# Patient Record
Sex: Male | Born: 1965 | Race: White | Hispanic: No | Smoking: Former smoker
Health system: Southern US, Community
[De-identification: ages and names within clinical notes are randomized; demographics above are authoritative.]

## PROBLEM LIST (undated history)

## (undated) DIAGNOSIS — E119 Type 2 diabetes mellitus without complications: Secondary | ICD-10-CM

## (undated) DIAGNOSIS — E7800 Pure hypercholesterolemia, unspecified: Secondary | ICD-10-CM

## (undated) DIAGNOSIS — E109 Type 1 diabetes mellitus without complications: Secondary | ICD-10-CM

## (undated) DIAGNOSIS — K219 Gastro-esophageal reflux disease without esophagitis: Secondary | ICD-10-CM

## (undated) DIAGNOSIS — F419 Anxiety disorder, unspecified: Secondary | ICD-10-CM

## (undated) HISTORY — PX: EYE SURGERY: SHX253

## (undated) HISTORY — DX: Type 1 diabetes mellitus without complications: E10.9

## (undated) HISTORY — PX: FEMORAL ARTERY STENT: SHX1583

## (undated) HISTORY — PX: TOE SURGERY: SHX1073

## (undated) HISTORY — PX: OTHER SURGICAL HISTORY: SHX169

---

## 2008-09-08 ENCOUNTER — Emergency Department: Payer: Self-pay | Admitting: Emergency Medicine

## 2008-09-10 ENCOUNTER — Emergency Department: Payer: Self-pay | Admitting: Emergency Medicine

## 2009-07-23 ENCOUNTER — Emergency Department: Payer: Self-pay | Admitting: Unknown Physician Specialty

## 2009-09-22 ENCOUNTER — Emergency Department: Payer: Self-pay | Admitting: Emergency Medicine

## 2009-10-24 ENCOUNTER — Emergency Department: Payer: Self-pay | Admitting: Emergency Medicine

## 2011-01-11 ENCOUNTER — Emergency Department: Payer: Self-pay | Admitting: Emergency Medicine

## 2011-02-23 ENCOUNTER — Emergency Department: Payer: Self-pay | Admitting: Emergency Medicine

## 2011-10-16 ENCOUNTER — Emergency Department: Payer: Self-pay | Admitting: Emergency Medicine

## 2011-10-16 LAB — CBC
MCHC: 32.4 g/dL (ref 32.0–36.0)
MCV: 94 fL (ref 80–100)
Platelet: 118 10*3/uL — ABNORMAL LOW (ref 150–440)
RBC: 4.73 10*6/uL (ref 4.40–5.90)
RDW: 13.7 % (ref 11.5–14.5)
WBC: 4.9 10*3/uL (ref 3.8–10.6)

## 2011-10-16 LAB — COMPREHENSIVE METABOLIC PANEL
Albumin: 3.7 g/dL (ref 3.4–5.0)
Alkaline Phosphatase: 109 U/L (ref 50–136)
Bilirubin,Total: 0.4 mg/dL (ref 0.2–1.0)
Chloride: 104 mmol/L (ref 98–107)
EGFR (African American): >60
Glucose: 201 mg/dL — ABNORMAL HIGH (ref 65–99)
SGOT(AST): 28 U/L (ref 15–37)
SGPT (ALT): 26 U/L (ref 12–78)
Sodium: 138 mmol/L (ref 136–145)
Total Protein: 7.2 g/dL (ref 6.4–8.2)

## 2011-12-12 DIAGNOSIS — L03119 Cellulitis of unspecified part of limb: Secondary | ICD-10-CM | POA: Insufficient documentation

## 2011-12-16 DIAGNOSIS — M86172 Other acute osteomyelitis, left ankle and foot: Secondary | ICD-10-CM | POA: Insufficient documentation

## 2011-12-16 DIAGNOSIS — M86179 Other acute osteomyelitis, unspecified ankle and foot: Secondary | ICD-10-CM | POA: Insufficient documentation

## 2011-12-19 ENCOUNTER — Emergency Department: Payer: Self-pay | Admitting: Emergency Medicine

## 2012-01-11 ENCOUNTER — Emergency Department: Payer: Self-pay | Admitting: Emergency Medicine

## 2012-01-11 LAB — URINALYSIS, COMPLETE
Glucose,UR: >=500 mg/dL (ref 0–75)
Leukocyte Esterase: NEGATIVE
Nitrite: NEGATIVE
Ph: 6 (ref 4.5–8.0)
Protein: 100
RBC,UR: 1305 /HPF (ref 0–5)
Specific Gravity: 1.022 (ref 1.003–1.030)

## 2012-01-11 LAB — COMPREHENSIVE METABOLIC PANEL
Albumin: 3.6 g/dL (ref 3.4–5.0)
Anion Gap: 6 — ABNORMAL LOW (ref 7–16)
Bilirubin,Total: 0.4 mg/dL (ref 0.2–1.0)
Chloride: 109 mmol/L — ABNORMAL HIGH (ref 98–107)
Creatinine: 0.87 mg/dL (ref 0.60–1.30)
EGFR (African American): >60
EGFR (Non-African Amer.): >60
Glucose: 84 mg/dL (ref 65–99)
Osmolality: 283 (ref 275–301)
Potassium: 3.7 mmol/L (ref 3.5–5.1)
SGOT(AST): 88 U/L — ABNORMAL HIGH (ref 15–37)
Sodium: 142 mmol/L (ref 136–145)
Total Protein: 6.4 g/dL (ref 6.4–8.2)

## 2012-01-11 LAB — CBC
HCT: 38 % — ABNORMAL LOW (ref 40.0–52.0)
HGB: 13 g/dL (ref 13.0–18.0)
MCHC: 34.3 g/dL (ref 32.0–36.0)
MCV: 93 fL (ref 80–100)
RDW: 15.2 % — ABNORMAL HIGH (ref 11.5–14.5)
WBC: 2.1 10*3/uL — ABNORMAL LOW (ref 3.8–10.6)

## 2012-01-11 LAB — LIPASE, BLOOD: Lipase: 123 U/L (ref 73–393)

## 2012-01-11 LAB — PROTIME-INR: Prothrombin Time: 13.5 secs (ref 11.5–14.7)

## 2012-01-13 LAB — URINE CULTURE

## 2012-05-29 DIAGNOSIS — E109 Type 1 diabetes mellitus without complications: Secondary | ICD-10-CM | POA: Insufficient documentation

## 2012-06-04 DIAGNOSIS — E113599 Type 2 diabetes mellitus with proliferative diabetic retinopathy without macular edema, unspecified eye: Secondary | ICD-10-CM | POA: Insufficient documentation

## 2012-06-19 DIAGNOSIS — E113593 Type 2 diabetes mellitus with proliferative diabetic retinopathy without macular edema, bilateral: Secondary | ICD-10-CM | POA: Insufficient documentation

## 2013-09-29 DIAGNOSIS — IMO0002 Reserved for concepts with insufficient information to code with codable children: Secondary | ICD-10-CM | POA: Insufficient documentation

## 2013-09-29 DIAGNOSIS — E1039 Type 1 diabetes mellitus with other diabetic ophthalmic complication: Secondary | ICD-10-CM | POA: Insufficient documentation

## 2013-09-29 DIAGNOSIS — E11319 Type 2 diabetes mellitus with unspecified diabetic retinopathy without macular edema: Secondary | ICD-10-CM | POA: Insufficient documentation

## 2013-09-29 DIAGNOSIS — E1065 Type 1 diabetes mellitus with hyperglycemia: Secondary | ICD-10-CM

## 2014-02-03 ENCOUNTER — Emergency Department: Payer: Self-pay | Admitting: Emergency Medicine

## 2014-02-03 LAB — CBC WITH DIFFERENTIAL/PLATELET
BASOS ABS: 0 10*3/uL (ref 0.0–0.1)
Basophil %: 0.6 %
EOS PCT: 1.2 %
Eosinophil #: 0.1 10*3/uL (ref 0.0–0.7)
HCT: 39.8 % — ABNORMAL LOW (ref 40.0–52.0)
HGB: 12.8 g/dL — ABNORMAL LOW (ref 13.0–18.0)
LYMPHS ABS: 1.3 10*3/uL (ref 1.0–3.6)
LYMPHS PCT: 16.7 %
MCH: 29.5 pg (ref 26.0–34.0)
MCHC: 32 g/dL (ref 32.0–36.0)
MCV: 92 fL (ref 80–100)
Monocyte #: 0.5 x10 3/mm (ref 0.2–1.0)
Monocyte %: 6.8 %
NEUTROS ABS: 5.8 10*3/uL (ref 1.4–6.5)
Neutrophil %: 74.7 %
Platelet: 291 10*3/uL (ref 150–440)
RBC: 4.34 10*6/uL — ABNORMAL LOW (ref 4.40–5.90)
RDW: 13.3 % (ref 11.5–14.5)
WBC: 7.8 10*3/uL (ref 3.8–10.6)

## 2014-02-03 LAB — URINALYSIS, COMPLETE
BILIRUBIN, UR: NEGATIVE
Glucose,UR: >=500 mg/dL (ref 0–75)
Nitrite: NEGATIVE
Ph: 5 (ref 4.5–8.0)
Protein: 30
SQUAMOUS EPITHELIAL: NONE SEEN
Specific Gravity: 1.024 (ref 1.003–1.030)

## 2014-02-03 LAB — BASIC METABOLIC PANEL
Anion Gap: 9 (ref 7–16)
BUN: 18 mg/dL (ref 7–18)
Calcium, Total: 9 mg/dL (ref 8.5–10.1)
Chloride: 99 mmol/L (ref 98–107)
Co2: 28 mmol/L (ref 21–32)
Creatinine: 1.18 mg/dL (ref 0.60–1.30)
EGFR (African American): >60
EGFR (Non-African Amer.): >60
GLUCOSE: 309 mg/dL — AB (ref 65–99)
OSMOLALITY: 286 (ref 275–301)
Potassium: 4.3 mmol/L (ref 3.5–5.1)
SODIUM: 136 mmol/L (ref 136–145)

## 2014-03-02 ENCOUNTER — Emergency Department: Payer: Self-pay | Admitting: Emergency Medicine

## 2014-03-02 LAB — COMPREHENSIVE METABOLIC PANEL
ALBUMIN: 3 g/dL — AB (ref 3.4–5.0)
ALK PHOS: 100 U/L
ANION GAP: 3 — AB (ref 7–16)
BUN: 28 mg/dL — ABNORMAL HIGH (ref 7–18)
Bilirubin,Total: 0.4 mg/dL (ref 0.2–1.0)
CHLORIDE: 102 mmol/L (ref 98–107)
CREATININE: 1.3 mg/dL (ref 0.60–1.30)
Calcium, Total: 9 mg/dL (ref 8.5–10.1)
Co2: 33 mmol/L — ABNORMAL HIGH (ref 21–32)
EGFR (African American): >60
EGFR (Non-African Amer.): >60
Glucose: 349 mg/dL — ABNORMAL HIGH (ref 65–99)
Osmolality: 295 (ref 275–301)
Potassium: 4.7 mmol/L (ref 3.5–5.1)
SGOT(AST): 14 U/L — ABNORMAL LOW (ref 15–37)
SGPT (ALT): 12 U/L — ABNORMAL LOW
SODIUM: 138 mmol/L (ref 136–145)
TOTAL PROTEIN: 7.8 g/dL (ref 6.4–8.2)

## 2014-03-02 LAB — CBC WITH DIFFERENTIAL/PLATELET
BASOS ABS: 0 10*3/uL (ref 0.0–0.1)
Basophil %: 0.4 %
Eosinophil #: 0.1 10*3/uL (ref 0.0–0.7)
Eosinophil %: 1 %
HCT: 37.2 % — ABNORMAL LOW (ref 40.0–52.0)
HGB: 11.9 g/dL — ABNORMAL LOW (ref 13.0–18.0)
LYMPHS ABS: 1 10*3/uL (ref 1.0–3.6)
Lymphocyte %: 10.6 %
MCH: 29.2 pg (ref 26.0–34.0)
MCHC: 32.1 g/dL (ref 32.0–36.0)
MCV: 91 fL (ref 80–100)
MONO ABS: 0.7 x10 3/mm (ref 0.2–1.0)
MONOS PCT: 6.9 %
Neutrophil #: 8 10*3/uL — ABNORMAL HIGH (ref 1.4–6.5)
Neutrophil %: 81.1 %
Platelet: 270 10*3/uL (ref 150–440)
RBC: 4.09 10*6/uL — ABNORMAL LOW (ref 4.40–5.90)
RDW: 14 % (ref 11.5–14.5)
WBC: 9.9 10*3/uL (ref 3.8–10.6)

## 2014-03-02 LAB — URINALYSIS, COMPLETE
BACTERIA: NONE SEEN
Bilirubin,UR: NEGATIVE
Glucose,UR: >=500 mg/dL (ref 0–75)
KETONE: NEGATIVE
Leukocyte Esterase: NEGATIVE
Nitrite: NEGATIVE
PH: 5 (ref 4.5–8.0)
SPECIFIC GRAVITY: 1.017 (ref 1.003–1.030)
Squamous Epithelial: NONE SEEN

## 2014-11-30 ENCOUNTER — Emergency Department
Admission: EM | Admit: 2014-11-30 | Discharge: 2014-11-30 | Disposition: A | Discharge status: Home / Self Care | Payer: Self-pay | Attending: Emergency Medicine | Admitting: Emergency Medicine

## 2014-11-30 DIAGNOSIS — N3000 Acute cystitis without hematuria: Secondary | ICD-10-CM | POA: Insufficient documentation

## 2014-11-30 DIAGNOSIS — B029 Zoster without complications: Secondary | ICD-10-CM | POA: Insufficient documentation

## 2014-11-30 DIAGNOSIS — N342 Other urethritis: Secondary | ICD-10-CM | POA: Insufficient documentation

## 2014-11-30 DIAGNOSIS — E119 Type 2 diabetes mellitus without complications: Secondary | ICD-10-CM | POA: Insufficient documentation

## 2014-11-30 DIAGNOSIS — N341 Nonspecific urethritis: Secondary | ICD-10-CM

## 2014-11-30 HISTORY — DX: Type 2 diabetes mellitus without complications: E11.9

## 2014-11-30 LAB — CHLAMYDIA/NGC RT PCR (ARMC ONLY)
CHLAMYDIA TR: NOT DETECTED
N GONORRHOEAE: NOT DETECTED

## 2014-11-30 LAB — URINALYSIS COMPLETE WITH MICROSCOPIC (ARMC ONLY)
Bilirubin Urine: NEGATIVE
Glucose, UA: >500 mg/dL — AB
Ketones, ur: NEGATIVE mg/dL
Nitrite: NEGATIVE
PROTEIN: NEGATIVE mg/dL
SQUAMOUS EPITHELIAL / LPF: NONE SEEN
Specific Gravity, Urine: 1.021 (ref 1.005–1.030)
pH: 5 (ref 5.0–8.0)

## 2014-11-30 LAB — WET PREP, GENITAL
Clue Cells Wet Prep HPF POC: NONE SEEN
TRICH WET PREP: NONE SEEN
YEAST WET PREP: NONE SEEN

## 2014-11-30 MED ORDER — LIDOCAINE 4 % EX PTCH: 1.0000 | MEDICATED_PATCH | Freq: Every day | CUTANEOUS | Status: DC

## 2014-11-30 MED ORDER — AZITHROMYCIN 250 MG PO TABS
1000.0000 mg | ORAL_TABLET | Freq: Once | ORAL | Status: AC
  Administered 2014-11-30: 1000 mg via ORAL
  Filled 2014-11-30: qty 4

## 2014-11-30 MED ORDER — NAPROXEN 500 MG PO TABS: 500.0000 mg | ORAL_TABLET | Freq: Two times a day (BID) | ORAL | Status: AC

## 2014-11-30 MED ORDER — LIDOCAINE HCL (PF) 1 % IJ SOLN
2.1000 mL | Freq: Once | INTRAMUSCULAR | Status: AC
  Administered 2014-11-30: 2.1 mL
  Filled 2014-11-30: qty 5

## 2014-11-30 MED ORDER — CEFTRIAXONE SODIUM 1 G IJ SOLR
1.0000 g | Freq: Once | INTRAMUSCULAR | Status: AC
  Administered 2014-11-30: 1 g via INTRAMUSCULAR
  Filled 2014-11-30: qty 10

## 2014-11-30 NOTE — ED Notes (Signed)
States he has noticed dark and foul smelling urine for a few days  occasional burn denies any discharge

## 2014-11-30 NOTE — ED Provider Notes (Signed)
[facility] Emergency Department Provider Note  ____________________________________________  Time seen: Approximately 2:17 PM  I have reviewed the triage vital signs and the nursing notes.   HISTORY  Chief Complaint Rash and Urinary Frequency   HPI Leonard Johnston is a 49 y.o. male who presents to the emergency department for evaluation of rash to the right side of his abdomen that has been present for approximately a week. He is also concerned about foul-smelling urine that has been present for the past 3 days. He reports that he has had frequent urinary tract infections. He denies known or concern for STD exposure.   Past Medical History  Diagnosis Date  . Diabetes mellitus without complication ([place])     There are no active problems to display for this patient.   Past Surgical History  Procedure Laterality Date  . Fracture surgery      Current Outpatient Rx  Name  Route  Sig  Dispense  Refill  . Lidocaine 4 % PTCH   Apply externally   Apply 1 patch topically daily. Do not apply more than 3 patches at a time   30 patch   0   . naproxen (NAPROSYN) 500 MG tablet   Oral   Take 1 tablet (500 mg total) by mouth 2 (two) times daily with a meal.   60 tablet   2     Allergies Review of patient's allergies indicates no known allergies.  No family history on file.  Social History Social History  Substance Use Topics  . Smoking status: Never Smoker   . Smokeless tobacco: None  . Alcohol Use: No    Review of Systems Constitutional: No fever/chills Eyes: No visual changes. ENT: No sore throat. Cardiovascular: Denies chest pain. Respiratory: Denies shortness of breath. Gastrointestinal: No abdominal pain.  No nausea, no vomiting.  No diarrhea.  No constipation. Genitourinary: Occasional dysuria, positive for foul-smelling urine Musculoskeletal: Negative for back pain. Skin: Positive for rash. Neurological: Negative for headaches, focal  weakness or numbness.  10-point ROS otherwise negative.  ____________________________________________   PHYSICAL EXAM:  VITAL SIGNS: ED Triage Vitals  Enc Vitals Group     BP 11/30/14 0814 122/82 mmHg     Pulse Rate 11/30/14 0814 81     Resp 11/30/14 0814 16     Temp 11/30/14 0814 97.5 F (36.4 C)     Temp Source 11/30/14 0814 Oral     SpO2 11/30/14 0814 100 %     Weight 11/30/14 0814 181 lb (82.101 kg)     Height 11/30/14 0814 6\' 9"  (2.057 m)     Head Cir --      Peak Flow --      Pain Score 11/30/14 0814 4     Pain Loc --      Pain Edu? --      Excl. in [place]? --     Constitutional: Alert and oriented. Well appearing and in no acute distress. Eyes: Conjunctivae are normal. PERRL. EOMI. Head: Atraumatic. Nose: No congestion/rhinnorhea. Mouth/Throat: Mucous membranes are moist.   Neck: No stridor.   Cardiovascular: Normal rate, regular rhythm.  Good peripheral circulation. Respiratory: Normal respiratory effort.  No retractions. Gastrointestinal: Soft and nontender. No distention. No abdominal bruits. No CVA tenderness. Musculoskeletal: No lower extremity tenderness nor edema.  No joint effusions. Neurologic:  Normal speech and language. No gross focal neurologic deficits are appreciated. No gait instability. Skin:  Skin is warm, dry and intact. Vesicular rash noted along  the dermatome on the right lower abdomen. Rash does not cross the midline. Psychiatric: Mood and affect are normal. Speech and behavior are normal.  ____________________________________________   LABS (all labs ordered are listed, but only abnormal results are displayed)  Labs Reviewed  WET PREP, GENITAL - Abnormal; Notable for the following:    WBC, Wet Prep HPF POC MODERATE (*)    All other components within normal limits  URINALYSIS COMPLETEWITH MICROSCOPIC ([facility] ONLY) - Abnormal; Notable for the following:    Color, Urine YELLOW (*)    APPearance CLOUDY (*)    Glucose, UA >500 (*)    Hgb  urine dipstick 1+ (*)    Leukocytes, UA 3+ (*)    Bacteria, UA FEW (*)    All other components within normal limits  CHLAMYDIA/NGC RT PCR ([facility] ONLY)   WBC: 6-30 ____________________________________________  EKG   ____________________________________________  RADIOLOGY   ____________________________________________   PROCEDURES  Procedure(s) performed: None  Critical Care performed: No  ____________________________________________   INITIAL IMPRESSION / ASSESSMENT AND PLAN / ED COURSE  Pertinent labs & imaging results that were available during my care of the patient were reviewed by me and considered in my medical decision making (see chart for details).  Patient was advised to follow up with the primary care provider of his choice in 10 days to recheck the urine. He was advised to return to the ER for symptoms that change or worsen if unable to schedule an appointment. ____________________________________________   FINAL CLINICAL IMPRESSION(S) / ED DIAGNOSES  Final diagnoses:  Urethritis, nonspecific  Acute cystitis without hematuria  Herpes zoster      Victorino Dike, FNP 11/30/14 1425  Lavonia Drafts, MD 11/30/14 1524

## 2014-11-30 NOTE — ED Notes (Signed)
Pt c/o painful rash to the right mid abd for the past week.the patient also c/o foul smelling odor to urine

## 2015-05-18 ENCOUNTER — Ambulatory Visit
Admission: EM | Admit: 2015-05-18 | Discharge: 2015-05-18 | Disposition: A | Discharge status: Home / Self Care | Payer: BLUE CROSS/BLUE SHIELD | Attending: Family Medicine | Admitting: Family Medicine

## 2015-05-18 ENCOUNTER — Encounter: Payer: Self-pay | Admitting: *Deleted

## 2015-05-18 ENCOUNTER — Ambulatory Visit (INDEPENDENT_AMBULATORY_CARE_PROVIDER_SITE_OTHER): Payer: BLUE CROSS/BLUE SHIELD

## 2015-05-18 DIAGNOSIS — M7022 Olecranon bursitis, left elbow: Secondary | ICD-10-CM | POA: Diagnosis not present

## 2015-05-18 DIAGNOSIS — S5002XA Contusion of left elbow, initial encounter: Secondary | ICD-10-CM

## 2015-05-18 HISTORY — DX: Pure hypercholesterolemia, unspecified: E78.00

## 2015-05-18 HISTORY — DX: Anxiety disorder, unspecified: F41.9

## 2015-05-18 MED ORDER — CEPHALEXIN 500 MG PO CAPS: 500.0000 mg | ORAL_CAPSULE | Freq: Four times a day (QID) | ORAL | Status: DC

## 2015-05-18 NOTE — ED Notes (Signed)
Patient fell on left elbow this AM while working on his floor at home.

## 2015-05-18 NOTE — ED Provider Notes (Signed)
CSN: [id number]     Arrival date & time 05/18/15  1850 History   First MD Initiated Contact with Patient 05/18/15 1915     Chief Complaint  Patient presents with  . Joint Swelling   (Consider location/radiation/quality/duration/timing/severity/associated sxs/prior Treatment) HPI: Patient presents today after falling on his left elbow. He denies any other injuries. He denies any previous injury to the left elbow in the past. He has been applying ice to the area and the area has gotten smaller in size. He has full range of motion of the elbow. He denies any fever or chills. He is a type I diabetic. He states he has never had any history of MRSA in the past.  Past Medical History  Diagnosis Date  . Diabetes mellitus without complication ([place])   . Hypertension   . Anxiety   . Hypercholesteremia    Past Surgical History  Procedure Laterality Date  . Fracture surgery     History reviewed. No pertinent family history. Social History  Substance Use Topics  . Smoking status: Never Smoker   . Smokeless tobacco: None  . Alcohol Use: No    Review of Systems: Negative except mentioned above.   Allergies  Review of patient's allergies indicates no known allergies.  Home Medications   Prior to Admission medications   Medication Sig Start Date End Date Taking? Authorizing Provider  busPIRone (BUSPAR) 10 MG tablet Take 10 mg by mouth 3 (three) times daily.   Yes Historical Provider, MD  gabapentin (NEURONTIN) 400 MG capsule Take 400 mg by mouth 3 (three) times daily.   Yes Historical Provider, MD  hydrOXYzine (ATARAX/VISTARIL) 25 MG tablet Take 25 mg by mouth 3 (three) times daily.   Yes Historical Provider, MD  insulin aspart (NOVOLOG FLEXPEN) 100 UNIT/ML FlexPen Inject into the skin 3 (three) times daily with meals.   Yes Historical Provider, MD  Insulin Glargine (LANTUS SOLOSTAR) 100 UNIT/ML Solostar Pen Inject 30 Units into the skin daily at 10 pm.   Yes Historical Provider, MD   lisinopril (PRINIVIL,ZESTRIL) 5 MG tablet Take 5 mg by mouth daily.   Yes Historical Provider, MD  omeprazole (PRILOSEC) 20 MG capsule Take 20 mg by mouth daily.   Yes Historical Provider, MD  simvastatin (ZOCOR) 20 MG tablet Take 20 mg by mouth daily.   Yes Historical Provider, MD  traZODone (DESYREL) 50 MG tablet Take 50 mg by mouth at bedtime.   Yes Historical Provider, MD  cephALEXin (KEFLEX) 500 MG capsule Take 1 capsule (500 mg total) by mouth 4 (four) times daily. 05/18/15   Paulina Fusi, MD  Lidocaine 4 % PTCH Apply 1 patch topically daily. Do not apply more than 3 patches at a time 11/30/14   Victorino Dike, FNP  naproxen (NAPROSYN) 500 MG tablet Take 1 tablet (500 mg total) by mouth 2 (two) times daily with a meal. 11/30/14 11/30/15  Victorino Dike, FNP   Meds Ordered and Administered this Visit  Medications - No data to display  BP 102/68 mmHg  Pulse 82  Temp(Src) 97.8 F (36.6 C) (Oral)  Resp 18  Ht 6\' 9"  (2.057 m)  Wt 190 lb (86.183 kg)  BMI 20.37 kg/m2  SpO2 98% No data found.   Physical Exam   GENERAL: NAD MSK: L Elbow-mild swelling and tenderness at olecranon, FROM, nv intact, *there is a 41mm abrasion/break in the skin with no bleeding at the olecranon site   ED Course  Procedures (including critical care time)  Labs Review Labs Reviewed - No data to display  Imaging Review Dg Elbow Complete Left  05/18/2015  CLINICAL DATA:  Pain following fall EXAM: LEFT ELBOW - COMPLETE 3+ VIEW COMPARISON:  September 22, 2009 FINDINGS: Frontal, lateral, and bilateral oblique views were obtained. There is no fracture or dislocation. No joint effusion. The joint spaces appear normal. No erosive change IMPRESSION: No fracture or dislocation.  No appreciable arthropathy. Electronically Signed   By: Lowella Grip III M.D.   On: 05/18/2015 19:31     MDM   1. Elbow contusion, left, initial encounter   2. Bursitis of elbow, left   Encouraged RICE. Will take anti-inflammatory when  necessary. Patient has a very tiny abrasion in the olecranon area. I have given him Hibiclens to clean the area daily. He is to monitor for any infection. We will put him on Keflex prophylactically. I have recommended that he follow up with his primary care physician this week for follow-up. Discussed the importance of keeping the area clean and watching it carefully for any signs of infection. Patient addresses understanding    Paulina Fusi, MD 05/18/15 [phone]

## 2015-06-10 ENCOUNTER — Emergency Department
Admission: EM | Admit: 2015-06-10 | Discharge: 2015-06-10 | Disposition: A | Discharge status: Home / Self Care | Payer: BLUE CROSS/BLUE SHIELD | Attending: Student | Admitting: Student

## 2015-06-10 ENCOUNTER — Encounter: Payer: Self-pay | Admitting: Emergency Medicine

## 2015-06-10 DIAGNOSIS — E162 Hypoglycemia, unspecified: Secondary | ICD-10-CM

## 2015-06-10 DIAGNOSIS — I10 Essential (primary) hypertension: Secondary | ICD-10-CM | POA: Insufficient documentation

## 2015-06-10 DIAGNOSIS — Z79899 Other long term (current) drug therapy: Secondary | ICD-10-CM | POA: Insufficient documentation

## 2015-06-10 DIAGNOSIS — Z794 Long term (current) use of insulin: Secondary | ICD-10-CM | POA: Diagnosis not present

## 2015-06-10 DIAGNOSIS — E7800 Pure hypercholesterolemia, unspecified: Secondary | ICD-10-CM | POA: Insufficient documentation

## 2015-06-10 DIAGNOSIS — E11649 Type 2 diabetes mellitus with hypoglycemia without coma: Secondary | ICD-10-CM | POA: Diagnosis present

## 2015-06-10 LAB — CBC WITH DIFFERENTIAL/PLATELET
BASOS ABS: 0 10*3/uL (ref 0–0.1)
Basophils Relative: 0 %
EOS PCT: 0 %
Eosinophils Absolute: 0 10*3/uL (ref 0–0.7)
HEMATOCRIT: 41.6 % (ref 40.0–52.0)
Hemoglobin: 13.9 g/dL (ref 13.0–18.0)
LYMPHS ABS: 0.5 10*3/uL — AB (ref 1.0–3.6)
LYMPHS PCT: 7 %
MCH: 30.8 pg (ref 26.0–34.0)
MCHC: 33.5 g/dL (ref 32.0–36.0)
MCV: 91.9 fL (ref 80.0–100.0)
MONO ABS: 0.4 10*3/uL (ref 0.2–1.0)
Monocytes Relative: 6 %
NEUTROS ABS: 6 10*3/uL (ref 1.4–6.5)
Neutrophils Relative %: 87 %
PLATELETS: 120 10*3/uL — AB (ref 150–440)
RBC: 4.52 MIL/uL (ref 4.40–5.90)
RDW: 13.8 % (ref 11.5–14.5)
WBC: 6.9 10*3/uL (ref 3.8–10.6)

## 2015-06-10 LAB — BASIC METABOLIC PANEL
ANION GAP: 6 (ref 5–15)
BUN: 31 mg/dL — AB (ref 6–20)
CO2: 24 mmol/L (ref 22–32)
Calcium: 9.2 mg/dL (ref 8.9–10.3)
Chloride: 107 mmol/L (ref 101–111)
Creatinine, Ser: 1.24 mg/dL (ref 0.61–1.24)
GFR calc Af Amer: >60 mL/min (ref >60)
GFR calc non Af Amer: >60 mL/min (ref >60)
GLUCOSE: 114 mg/dL — AB (ref 65–99)
POTASSIUM: 4 mmol/L (ref 3.5–5.1)
Sodium: 137 mmol/L (ref 135–145)

## 2015-06-10 LAB — GLUCOSE, CAPILLARY
Glucose-Capillary: 192 mg/dL — ABNORMAL HIGH (ref 65–99)
Glucose-Capillary: 230 mg/dL — ABNORMAL HIGH (ref 65–99)

## 2015-06-10 NOTE — ED Notes (Signed)
Patient states he had a hypoglycemic event this morning where he lost "2 hours".  States he remembers being out in the yard watering flowers and then the next thing he knew he was in his truck across town soaking wet.  Reports taking his CBG and it being 38 around noon.    Patient is AAOx3.  Skin warm and dry.  Ambulated independently into triage.  Patient is currently in NAD.

## 2015-06-10 NOTE — ED Provider Notes (Addendum)
[facility] Emergency Department Provider Note  ____________________________________________  Time seen: Approximately 5:19 PM  I have reviewed the triage vital signs and the nursing notes.   HISTORY  Chief Complaint Hypoglycemia    HPI Leonard Johnston is a 50 y.o. male with hypertension, diabetes on insulin dependent, hyperlipidemia, anxiety who presents for evaluation of hypoglycemia today, gradual onset, now resolved after eating a cheeseburger and drinking Pepsi, initially severe. Patient reports that he awoke this morning and took his sliding scale insulin as well as long acting insulin however he did not eat breakfast because he wasn't particular hungry. He reports that he went outside to water his plants "and the next thing I know I was across town in my car... I had driven there and I didn't know how I got there and I was covered in sweat". He estimates that he "lost 2 hours" where he wasn't aware of what he was doing but he was apparently driving. He reports he was able to call his wife after he checked his sugar which was 52 and she sent her daughter who brought him candy, cheeseburgers and Pepsi's after which his sugar improved and he felt much better. He reports that this has happened to him possibly 12 times over the course of his lifetime, always in the setting of hypoglycemia. He reports that at one point "I lost 9 hours of time" in a similar incident. He denies any chest pain, difficulty breathing, headache, numbness or weakness. No nausea, vomiting, diarrhea, fevers or chills.   Past Medical History  Diagnosis Date  . Diabetes mellitus without complication ([place])   . Hypertension   . Anxiety   . Hypercholesteremia     There are no active problems to display for this patient.   Past Surgical History  Procedure Laterality Date  . Fracture surgery      Current Outpatient Rx  Name  Route  Sig  Dispense  Refill  . busPIRone (BUSPAR) 10 MG tablet    Oral   Take 10 mg by mouth 3 (three) times daily.         . cephALEXin (KEFLEX) 500 MG capsule   Oral   Take 1 capsule (500 mg total) by mouth 4 (four) times daily.   20 capsule   0   . gabapentin (NEURONTIN) 400 MG capsule   Oral   Take 400 mg by mouth 3 (three) times daily.         . hydrOXYzine (ATARAX/VISTARIL) 25 MG tablet   Oral   Take 25 mg by mouth 3 (three) times daily.         . insulin aspart (NOVOLOG FLEXPEN) 100 UNIT/ML FlexPen   Subcutaneous   Inject into the skin 3 (three) times daily with meals.         . Insulin Glargine (LANTUS SOLOSTAR) 100 UNIT/ML Solostar Pen   Subcutaneous   Inject 30 Units into the skin daily at 10 pm.         . Lidocaine 4 % PTCH   Apply externally   Apply 1 patch topically daily. Do not apply more than 3 patches at a time   30 patch   0   . lisinopril (PRINIVIL,ZESTRIL) 5 MG tablet   Oral   Take 5 mg by mouth daily.         . naproxen (NAPROSYN) 500 MG tablet   Oral   Take 1 tablet (500 mg total) by mouth 2 (two) times daily with  a meal.   60 tablet   2   . omeprazole (PRILOSEC) 20 MG capsule   Oral   Take 20 mg by mouth daily.         . simvastatin (ZOCOR) 20 MG tablet   Oral   Take 20 mg by mouth daily.         . traZODone (DESYREL) 50 MG tablet   Oral   Take 50 mg by mouth at bedtime.           Allergies Review of patient's allergies indicates no known allergies.  No family history on file.  Social History Social History  Substance Use Topics  . Smoking status: Never Smoker   . Smokeless tobacco: None  . Alcohol Use: No    Review of Systems Constitutional: No fever/chills Eyes: No visual changes. ENT: No sore throat. Cardiovascular: Denies chest pain. Respiratory: Denies shortness of breath. Gastrointestinal: No abdominal pain.  No nausea, no vomiting.  No diarrhea.  No constipation. Genitourinary: Negative for dysuria. Musculoskeletal: Negative for back pain. Skin: Negative for  rash. Neurological: Negative for headaches, focal weakness or numbness.  10-point ROS otherwise negative.  ____________________________________________   PHYSICAL EXAM:  VITAL SIGNS: ED Triage Vitals  Enc Vitals Group     BP 06/10/15 1438 152/83 mmHg     Pulse Rate 06/10/15 1438 83     Resp 06/10/15 1438 16     Temp 06/10/15 1438 97.4 F (36.3 C)     Temp Source 06/10/15 1438 Oral     SpO2 06/10/15 1438 97 %     Weight 06/10/15 1438 191 lb (86.637 kg)     Height 06/10/15 1438 6\' 9"  (2.057 m)     Head Cir --      Peak Flow --      Pain Score 06/10/15 1440 0     Pain Loc --      Pain Edu? --      Excl. in [place]? --     Constitutional: Alert and oriented. Well appearing and in no acute distress. Eyes: Conjunctivae are normal. PERRL. EOMI. Head: Atraumatic. Nose: No congestion/rhinnorhea. Mouth/Throat: Mucous membranes are moist.  Oropharynx non-erythematous. Neck: No stridor.  No cervical spine tenderness to palpation. Cardiovascular: Normal rate, regular rhythm. Grossly normal heart sounds.  Good peripheral circulation. Respiratory: Normal respiratory effort.  No retractions. Lungs CTAB. Gastrointestinal: Soft and nontender. No distention.  No CVA tenderness. Genitourinary: deferred Musculoskeletal: No lower extremity tenderness nor edema.  No joint effusions. Neurologic:  Normal speech and language. No gross focal neurologic deficits are appreciated. No gait instability. 5 out of 5 strength bilateral upper and lower extremity, sensation intact to light touch throughout, cranial nerves II through XII intact. Skin:  Skin is warm, dry and intact. No rash noted. Psychiatric: Mood and affect are normal. Speech and behavior are normal.  ____________________________________________   LABS (all labs ordered are listed, but only abnormal results are displayed)  Labs Reviewed  CBC WITH DIFFERENTIAL/PLATELET - Abnormal; Notable for the following:    Platelets 120 (*)    Lymphs  Abs 0.5 (*)    All other components within normal limits  BASIC METABOLIC PANEL - Abnormal; Notable for the following:    Glucose, Bld 114 (*)    BUN 31 (*)    All other components within normal limits  GLUCOSE, CAPILLARY - Abnormal; Notable for the following:    Glucose-Capillary 192 (*)    All other components within normal limits  GLUCOSE, CAPILLARY -  Abnormal; Notable for the following:    Glucose-Capillary 230 (*)    All other components within normal limits   ____________________________________________  EKG  ED ECG REPORT I, Joanne Gavel, the attending physician, personally viewed and interpreted this ECG.   Date: 06/10/2015  EKG Time: 17:57  Rate: 79  Rhythm: normal sinus rhythm  Axis: normal  Intervals:none  ST&T Change: No acute ST elevation. No acute ST depression. Motion artifact in the lateral and inferior leads.  ____________________________________________  RADIOLOGY  none ____________________________________________   PROCEDURES  Procedure(s) performed: None  Critical Care performed: No  ____________________________________________   INITIAL IMPRESSION / ASSESSMENT AND PLAN / ED COURSE  Pertinent labs & imaging results that were available during my care of the patient were reviewed by me and considered in my medical decision making (see chart for details).  Leonard Johnston is a 50 y.o. male with hypertension, diabetes on insulin dependent, hyperlipidemia, anxiety who presents for evaluation of hypoglycemia today as well as a bizarre episode where he "lost time". This has happened to him many times before. Currently he has no complaints. He is awake, alert, in no acute distress. He has an intact neurological examination. Vital signs stable, he is afebrile. CBC notable for mild thrombocytopenia with platelet count 120. BMP unremarkable, initial glucose was 114. We'll observe in the emergency department, check serial glucoses, reassess for  disposition.  ----------------------------------------- 7:18 PM on 06/10/2015 ----------------------------------------- Patient has had several stable glucose measurements here in the emergency department. He continues to feel well. I've advised him to eat breakfast if he is going to give himself his insulin in the morning as prescribed. We also discussed return precautions, need for close PCP follow-up and he is comfortable with the discharge plan. DC home. ____________________________________________   FINAL CLINICAL IMPRESSION(S) / ED DIAGNOSES  Final diagnoses:  Hypoglycemia      Joanne Gavel, MD 06/10/15 1919  Joanne Gavel, MD 06/10/15 1919

## 2015-07-24 ENCOUNTER — Ambulatory Visit
Admission: EM | Admit: 2015-07-24 | Discharge: 2015-07-24 | Disposition: A | Discharge status: Home / Self Care | Payer: BLUE CROSS/BLUE SHIELD | Attending: Family Medicine | Admitting: Family Medicine

## 2015-07-24 ENCOUNTER — Encounter: Payer: Self-pay | Admitting: Emergency Medicine

## 2015-07-24 DIAGNOSIS — S338XXA Sprain of other parts of lumbar spine and pelvis, initial encounter: Secondary | ICD-10-CM

## 2015-07-24 DIAGNOSIS — S39012A Strain of muscle, fascia and tendon of lower back, initial encounter: Secondary | ICD-10-CM

## 2015-07-24 MED ORDER — OXYCODONE-ACETAMINOPHEN 5-325 MG PO TABS
1.0000 | ORAL_TABLET | Freq: Three times a day (TID) | ORAL | Status: DC | PRN
Start: 2015-07-24 — End: 2016-08-03

## 2015-07-24 MED ORDER — MELOXICAM 15 MG PO TABS: 15.0000 mg | ORAL_TABLET | Freq: Every day | ORAL | Status: DC | PRN

## 2015-07-24 NOTE — ED Provider Notes (Signed)
[facility]  ____________________________________________  Time seen: Approximately 1650 PM  I have reviewed the triage vital signs and the nursing notes.   HISTORY  Chief Complaint  Back Pain  HPI Leonard Johnston is a 50 y.o. male present with wife at bedside for the complaints of left lower back pain 3 days. Patient reports that pain onset was when he was carrying a heavy roll of carpet upstairs. Patient states that he was carrying the roll of carpet on his right shoulder and then as he stepped awkwardly with his left leg he states that he twisted his back and felt a pain in his left lower back. Patient reports he has had continued pain is left lower back since. Patient reports that once he felt the pain he passed off the carpet to his friend. Patient states that he held to the railing and did not fall. Denies any direct trauma. Denies fall, head injury or loss of consciousness. Reports pain has been present since, and unresolved with over the counter ibuprofen. Reports mild pain when sitting still, but pain primarily with movement.   Denies pain radiation, numbness, tingling sensation, other injury, midline pain, dysuria, urinary or bowel retention or incontinence. Denies fevers Denies recent sickness. Denies extremity pains. Denies chest pain, shortness of breath, abdominal pain, weakness, or dizziness.   PCP: Brunetta Genera   Past Medical History  Diagnosis Date  . Diabetes mellitus without complication ([place])   . Hypertension   . Anxiety   . Hypercholesteremia     There are no active problems to display for this patient.   Past Surgical History  Procedure Laterality Date  . Fracture surgery      Current Outpatient Rx  Name  Route  Sig  Dispense  Refill  . busPIRone (BUSPAR) 10 MG tablet   Oral   Take 10 mg by mouth 3 (three) times daily.         .           . gabapentin (NEURONTIN) 400 MG capsule   Oral   Take 400 mg by mouth 3 (three) times daily.         .  hydrOXYzine (ATARAX/VISTARIL) 25 MG tablet   Oral   Take 25 mg by mouth 3 (three) times daily.         . insulin aspart (NOVOLOG FLEXPEN) 100 UNIT/ML FlexPen   Subcutaneous   Inject into the skin 3 (three) times daily with meals.         . Insulin Glargine (LANTUS SOLOSTAR) 100 UNIT/ML Solostar Pen   Subcutaneous   Inject 30 Units into the skin daily at 10 pm.         .           . lisinopril (PRINIVIL,ZESTRIL) 5 MG tablet   Oral   Take 5 mg by mouth daily.         .           .           . omeprazole (PRILOSEC) 20 MG capsule   Oral   Take 20 mg by mouth daily.         .           . simvastatin (ZOCOR) 20 MG tablet   Oral   Take 20 mg by mouth daily.         . traZODone (DESYREL) 50 MG tablet   Oral   Take 50 mg by mouth at bedtime.  Allergies Review of patient's allergies indicates no known allergies.  History reviewed. No pertinent family history.  Social History Social History  Substance Use Topics  . Smoking status: Never Smoker   . Smokeless tobacco: None  . Alcohol Use: No    Review of Systems Constitutional: No fever/chills Eyes: No visual changes. ENT: No sore throat. Cardiovascular: Denies chest pain. Respiratory: Denies shortness of breath. Gastrointestinal: No abdominal pain.  No nausea, no vomiting.  No diarrhea.  No constipation. Genitourinary: Negative for dysuria. Musculoskeletal: positive for back pain. Skin: Negative for rash. Neurological: Negative for headaches, focal weakness or numbness.  10-point ROS otherwise negative.  ____________________________________________   PHYSICAL EXAM:  VITAL SIGNS: ED Triage Vitals  Enc Vitals Group     BP 07/24/15 1543 91/62 mmHg     Pulse Rate 07/24/15 1543 86     Resp 07/24/15 1543 16     Temp 07/24/15 1543 97.3 F (36.3 C)     Temp Source 07/24/15 1543 Tympanic     SpO2 07/24/15 1543 100 %     Weight 07/24/15 1543 190 lb (86.183 kg)     Height --      Head Cir  --      Peak Flow --      Pain Score 07/24/15 1545 10     Pain Loc --      Pain Edu? --      Excl. in [place]   07/24/15 1543 07/24/15 1545 07/24/15 1658  BP: 91/62  114/72  Pulse: 86  79  Temp: 97.3 F (36.3 C)    TempSrc: Tympanic    Resp: 16    Weight: 190 lb (86.183 kg)    SpO2: 100%    PainSc:  10-Worst pain ever 10-Worst pain ever    Constitutional: Alert and oriented. Well appearing and in no acute distress. Eyes: Conjunctivae are normal. PERRL. EOMI. Head: Atraumatic.  Mouth/Throat: Mucous membranes are moist.  Oropharynx non-erythematous. Neck: No stridor.  No cervical spine tenderness to palpation. Hematological/Lymphatic/Immunilogical: No cervical lymphadenopathy. Cardiovascular: Normal rate, regular rhythm. Grossly normal heart sounds.  Good peripheral circulation. Respiratory: Normal respiratory effort.  No retractions. Lungs CTAB. Gastrointestinal: Soft and nontender. No distention. Normal Bowel sounds. No CVA tenderness. Musculoskeletal: No lower or upper extremity tenderness nor edema.  Bilateral pedal pulses equal and easily palpated. No midline cervical, thoracic or lumbar tenderness to palpation without ecchymosis, erythema or swelling noted.  Except: Left lower paralumbar and left sciatic notch moderate tenderness to palpation, no skin changes noted, pain increased with lumbar flexion and twisting movements. No saddle anesthesia. 2+ bilateral patellar and achilles reflexes. Changes positions from sitting to standing quickly. Ambulatory in room with steady gait. Strong and equal bilateral plantar flexion and dorsiflexion.  Neurologic:  Normal speech and language. No gross focal neurologic deficits are appreciated. No gait instability.5/5 strength to bilateral upper and lower extremities.  Skin:  Skin is warm, dry and intact. No rash noted. Psychiatric: Mood and affect are normal. Speech and behavior are  normal.  ____________________________________________   LABS (all labs ordered are listed, but only abnormal results are displayed)  Labs Reviewed - No data to display  RADIOLOGY  No results found.   INITIAL IMPRESSION / ASSESSMENT AND PLAN / ED COURSE  Pertinent labs & imaging results that were available during my care of the patient were reviewed by me and considered in my medical decision making (see chart for details).  [place] controlled substance  database utilized, and no control substances documented in the last 6 months.  Very well appearing, no acute distress. Presents for complaints of left lower back pain after carrying heavy carpet. Denies fall or direct trauma. No focal neurological deficits. Suspect lumbosacral strain injury. Discussed evaluating by xray, patient declines need for xray and this time. Discussed with patient follow up and possible imaging if no improvement. Will treat with mobic and prn percocet for breath through pain. Encourage rest, ice stretching. Discussed indication, risks and benefits of medications with patient.Encouraged PCP or orthopedic follow up. Discussed monitoring glucose closely at home with medications.   Discussed follow up with Primary care physician this week. Discussed follow up and return parameters including no resolution or any worsening concerns. Patient verbalized understanding and agreed to plan.   ____________________________________________   FINAL CLINICAL IMPRESSION(S) / ED DIAGNOSES  Final diagnoses:  Lumbosacral strain, initial encounter     Discharge Medication List as of 07/24/2015  4:58 PM    START taking these medications   Details  meloxicam (MOBIC) 15 MG tablet Take 1 tablet (15 mg total) by mouth daily as needed for pain., Starting 07/24/2015, Until Discontinued, Print    oxyCODONE-acetaminophen (ROXICET) 5-325 MG tablet Take 1 tablet by mouth every 8 (eight) hours as needed for moderate pain or severe pain (Do not  drive or operate heavy machinery while taking as can cause drowsiness.)., Starting 07/24/2015, Until Discontinued, Print        Note: This dictation was prepared with Dragon dictation along with smaller phrase technology. Any transcriptional errors that result from this process are unintentional.       Marylene Land, NP 07/24/15 1923  Marylene Land, NP 07/24/15 1924

## 2015-07-24 NOTE — ED Notes (Signed)
Patient states that on Thursday he was carrying a roll of carpet up the stairs.  Patient c/o left sided lower back pain.

## 2015-07-24 NOTE — Discharge Instructions (Signed)
Take medication as prescribed. Rest. Drink plenty of fluids. Apply ice. Avoid strenuous activity. Stretch as tolerated.   Follow up with your primary care physician or orthopedic this week as needed for continued pain.   Return to Urgent care or ER for increased pain, urinary or bowel changes, numbness, new or worsening concerns.

## 2015-09-03 ENCOUNTER — Ambulatory Visit
Admission: EM | Admit: 2015-09-03 | Discharge: 2015-09-03 | Disposition: A | Discharge status: Home / Self Care | Payer: BLUE CROSS/BLUE SHIELD | Attending: Family Medicine | Admitting: Family Medicine

## 2015-09-03 ENCOUNTER — Encounter: Payer: Self-pay | Admitting: *Deleted

## 2015-09-03 DIAGNOSIS — R238 Other skin changes: Secondary | ICD-10-CM

## 2015-09-03 DIAGNOSIS — L089 Local infection of the skin and subcutaneous tissue, unspecified: Secondary | ICD-10-CM | POA: Diagnosis not present

## 2015-09-03 MED ORDER — MUPIROCIN CALCIUM 2 % EX CREA: 1.0000 "application " | TOPICAL_CREAM | Freq: Two times a day (BID) | CUTANEOUS | Status: DC

## 2015-09-03 MED ORDER — SULFAMETHOXAZOLE-TRIMETHOPRIM 800-160 MG PO TABS: 1.0000 | ORAL_TABLET | Freq: Two times a day (BID) | ORAL | Status: AC

## 2015-09-03 NOTE — ED Notes (Signed)
Pt went barefoot at water park 3 days ago and developed 4 ulcers on left foot. 1 on 2nd toe, 1 on 3rd toe, and 2 to sole of foot.

## 2015-09-03 NOTE — ED Provider Notes (Signed)
CSN: [place]:[id number]     Arrival date & time 09/03/15  1212 History   First MD Initiated Contact with Patient 09/03/15 1309     Chief Complaint  Patient presents with  . Foot Ulcer   (Consider location/radiation/quality/duration/timing/severity/associated sxs/prior Treatment) HPI: Patient presents today with open wounds on his left foot. Patient states that he was at a water park a few days ago and developed blisters on the foot by being on the concrete. 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 Patient denies any systemic symptoms such as fever. He has not been taking any medications for his symptoms. He denies any recent fluctuations in his blood sugar. He denies seeing a podiatrist on a regular basis. He admits to Tdap immunization within 5 years.  Past Medical History  Diagnosis Date  . Diabetes mellitus without complication ([place])   . Hypertension   . Anxiety   . Hypercholesteremia    Past Surgical History  Procedure Laterality Date  . Fracture surgery     History reviewed. No pertinent family history. Social History  Substance Use Topics  . Smoking status: Never Smoker   . Smokeless tobacco: None  . Alcohol Use: No    Review of Systems: Negative except mentioned above.   Allergies  Review of patient's allergies indicates no known allergies.  Home Medications   Prior to Admission medications   Medication Sig Start Date End Date Taking? Authorizing Provider  busPIRone (BUSPAR) 10 MG tablet Take 10 mg by mouth 3 (three) times daily.   Yes Historical Provider, MD  gabapentin (NEURONTIN) 400 MG capsule Take 400 mg by mouth 3 (three) times daily.   Yes Historical Provider, MD  hydrOXYzine (ATARAX/VISTARIL) 25 MG tablet Take 25 mg by mouth 3 (three) times daily.   Yes Historical Provider, MD  insulin aspart (NOVOLOG FLEXPEN) 100 UNIT/ML FlexPen Inject into the skin 3  (three) times daily with meals.   Yes Historical Provider, MD  Insulin Glargine (LANTUS SOLOSTAR) 100 UNIT/ML Solostar Pen Inject 30 Units into the skin daily at 10 pm.   Yes Historical Provider, MD  lisinopril (PRINIVIL,ZESTRIL) 5 MG tablet Take 5 mg by mouth daily.   Yes Historical Provider, MD  meloxicam (MOBIC) 15 MG tablet Take 1 tablet (15 mg total) by mouth daily as needed for pain. 07/24/15  Yes Marylene Land, NP  naproxen (NAPROSYN) 500 MG tablet Take 1 tablet (500 mg total) by mouth 2 (two) times daily with a meal. 11/30/14 11/30/15 Yes Cari B Triplett, FNP  omeprazole (PRILOSEC) 20 MG capsule Take 20 mg by mouth daily.   Yes Historical Provider, MD  simvastatin (ZOCOR) 20 MG tablet Take 20 mg by mouth daily.   Yes Historical Provider, MD  traZODone (DESYREL) 50 MG tablet Take 50 mg by mouth at bedtime.   Yes Historical Provider, MD  cephALEXin (KEFLEX) 500 MG capsule Take 1 capsule (500 mg total) by mouth 4 (four) times daily. 05/18/15   Paulina Fusi, MD  Lidocaine 4 % PTCH Apply 1 patch topically daily. Do not apply more than 3 patches at a time 11/30/14   Victorino Dike, FNP  oxyCODONE-acetaminophen (ROXICET) 5-325 MG tablet Take 1 tablet by mouth every 8 (eight) hours as needed for moderate pain or severe pain (Do not drive or operate heavy machinery while taking as can cause drowsiness.). 07/24/15   Marylene Land, NP  sulfamethoxazole-trimethoprim (BACTRIM DS,SEPTRA  DS) 800-160 MG tablet Take 1 tablet by mouth 2 (two) times daily. 09/03/15 09/10/15  Paulina Fusi, MD   Meds Ordered and Administered this Visit  Medications - No data to display  BP 122/87 mmHg  Pulse 88  Temp(Src) 98 F (36.7 C) (Oral)  Resp 16  Ht 6\' 9"  (2.057 m)  Wt 192 lb (87.091 kg)  BMI 20.58 kg/m2  SpO2 99% No data found.   Physical Exam:  GENERAL: NAD RESP: CTA B CARD: RRR SKIN: s/p amputation of left 2nd toe, open non-draining wounds on plantar surface of 2nd and 4th MTP, 3rd and 4th toes, doesn't  appear to be through to the bone, mild erythema around area, no streaks   NEURO: CN II-XII grossly intact   ED Course  Procedures (including critical care time)  Labs Review Labs Reviewed - No data to display  Imaging Review No results found.     MDM   1. Blisters of multiple sites   2. Skin infection   This does not appear to be osteomyelitis at this point however I do recommend that he follow up with podiatry on Monday for further evaluation and treatment. Imaging may need to be done at that time. I have placed the patient on Bactrim DS and Bactroban. I've asked that he monitor his temperature closely as well as his blood sugars. If any worsening symptoms happened this weekend I recommend that he go to the ER. I've asked that he not go barefoot and use crutches if needed. Patient addresses understanding of plan. If patient has problems getting an appointment with podiatry on Monday I've asked that he call her office so we can assist in this happening.    Paulina Fusi, MD 09/03/15 1346

## 2015-09-03 NOTE — ED Notes (Signed)
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 Present ulcers are on 3rd and 4th toes. Left foot is red and edematous.

## 2015-09-11 DIAGNOSIS — N1831 Chronic kidney disease, stage 3a: Secondary | ICD-10-CM | POA: Insufficient documentation

## 2015-09-11 DIAGNOSIS — F419 Anxiety disorder, unspecified: Secondary | ICD-10-CM | POA: Insufficient documentation

## 2015-09-11 DIAGNOSIS — I10 Essential (primary) hypertension: Secondary | ICD-10-CM | POA: Insufficient documentation

## 2015-09-11 DIAGNOSIS — E7800 Pure hypercholesterolemia, unspecified: Secondary | ICD-10-CM | POA: Insufficient documentation

## 2015-09-11 DIAGNOSIS — N179 Acute kidney failure, unspecified: Secondary | ICD-10-CM | POA: Insufficient documentation

## 2016-08-03 ENCOUNTER — Emergency Department
Admission: EM | Admit: 2016-08-03 | Discharge: 2016-08-03 | Disposition: A | Discharge status: Home / Self Care | Payer: Medicare Other | Attending: Emergency Medicine | Admitting: Emergency Medicine

## 2016-08-03 ENCOUNTER — Encounter: Payer: Self-pay | Admitting: Emergency Medicine

## 2016-08-03 DIAGNOSIS — Z79899 Other long term (current) drug therapy: Secondary | ICD-10-CM | POA: Insufficient documentation

## 2016-08-03 DIAGNOSIS — E109 Type 1 diabetes mellitus without complications: Secondary | ICD-10-CM | POA: Diagnosis not present

## 2016-08-03 DIAGNOSIS — Z794 Long term (current) use of insulin: Secondary | ICD-10-CM | POA: Diagnosis not present

## 2016-08-03 DIAGNOSIS — L237 Allergic contact dermatitis due to plants, except food: Secondary | ICD-10-CM | POA: Diagnosis not present

## 2016-08-03 DIAGNOSIS — R21 Rash and other nonspecific skin eruption: Secondary | ICD-10-CM | POA: Diagnosis present

## 2016-08-03 MED ORDER — PREDNISONE 10 MG PO TABS: ORAL_TABLET | ORAL | 0 refills | Status: DC

## 2016-08-03 MED ORDER — DEXAMETHASONE SODIUM PHOSPHATE 10 MG/ML IJ SOLN
10.0000 mg | Freq: Once | INTRAMUSCULAR | Status: AC
  Administered 2016-08-03: 10 mg via INTRAMUSCULAR
  Filled 2016-08-03: qty 1

## 2016-08-03 MED ORDER — TRIAMCINOLONE ACETONIDE 0.5 % EX OINT: 1.0000 "application " | TOPICAL_OINTMENT | Freq: Two times a day (BID) | CUTANEOUS | 0 refills | Status: DC

## 2016-08-03 NOTE — ED Triage Notes (Signed)
Patient presents to the ED with a rash bilaterally to his arms.  Patient states he believes it is poison oak.  Patient states he has been having difficulty sleeping due to pain and itching.  Patient states he has been taking benadryl without much relief.

## 2016-08-03 NOTE — ED Provider Notes (Signed)
[facility] Emergency Department Provider Note ____________________________________________  Time seen: 12:25 PM  I have reviewed the triage vital signs and the nursing notes.  HISTORY  Chief Complaint  Rash   HPI Leonard Johnston is a 51 y.o. male  is here complaining of rash. Patient states that he was doing some yard work and began breaking out in a rash the next day. He went back to the trash pile and was lots of poison oak where he had been working. Patient states that he has been taking Benadryl one every 4 hours without any relief of his itching and rash continues to spread. He now has involvement on his neck, bilateral arms, trunk, lower extremities with new places breaking out today. Patient is on insulin and checks his blood sugars 4 times a day. He states he averages around 135.    Past Medical History:  Diagnosis Date  . Anxiety   . 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 Hypercholesteremia     There are no active problems to display for this patient.   Past Surgical History:  Procedure Laterality Date  . EYE SURGERY    . TOE SURGERY      Prior to Admission medications   Medication Sig Start Date End Date Taking? Authorizing Provider  busPIRone (BUSPAR) 10 MG tablet Take 10 mg by mouth 3 (three) times daily.    [provider]  gabapentin (NEURONTIN) 400 MG capsule Take 400 mg by mouth 3 (three) times daily.    [provider]  insulin aspart (NOVOLOG FLEXPEN) 100 UNIT/ML FlexPen Inject into the skin 3 (three) times daily with meals.    [provider]  Insulin Glargine (LANTUS SOLOSTAR) 100 UNIT/ML Solostar Pen Inject 30 Units into the skin daily at 10 pm.    [provider]  lisinopril (PRINIVIL,ZESTRIL) 5 MG tablet Take 5 mg by mouth daily.    [provider]  omeprazole (PRILOSEC) 20 MG capsule Take 20 mg by mouth daily.    [provider]  predniSONE (DELTASONE) 10 MG  tablet Take 6 tablets  today, on day 2 take 5 tablets, day 3 take 4 tablets, day 4 take 3 tablets, day 5 take  2 tablets and 1 tablet the last day 08/03/16   Johnn Hai, PA-C  simvastatin (ZOCOR) 20 MG tablet Take 20 mg by mouth daily.    [provider]  traZODone (DESYREL) 50 MG tablet Take 50 mg by mouth at bedtime.    [provider]  triamcinolone ointment (KENALOG) 0.5 % Apply 1 application topically 2 (two) times daily. 08/03/16   Johnn Hai, PA-C    Allergies Patient has no known allergies.  No family history on file.  Social History Social History  Substance Use Topics  . Smoking status: Never Smoker  . Smokeless tobacco: Never Used  . Alcohol use No    Review of Systems  Constitutional: Negative for fever. Cardiovascular: Negative for chest pain. Respiratory: Negative for shortness of breath. Musculoskeletal: Negative for back pain. Skin: Positive for acute rash. Neurological: Negative for headaches, focal weakness or numbness. ____________________________________________  PHYSICAL EXAM:  VITAL SIGNS: ED Triage Vitals [08/03/16 1213]  Enc Vitals Group     BP 114/78     Pulse Rate 98     Resp 18     Temp 98.1 F (36.7 C)     Temp Source Oral     SpO2 99 %  Weight 200 lb (90.7 kg)     Height 6\' 9"  (2.057 m)     Head Circumference      Peak Flow      Pain Score 10     Pain Loc      Pain Edu?      Excl. in [place]?     Constitutional: Alert and oriented. Well appearing and in no distress. Head: Normocephalic and atraumatic. Eyes: Conjunctivae are normal. Neck: No stridor. Cardiovascular: Normal rate, regular rhythm. Normal distal pulses. Respiratory: Normal respiratory effort. No wheezes/rales/rhonchi. Gastrointestinal: Soft and nontender. No distention. Musculoskeletal: Nontender with normal range of motion in all extremities.  Neurologic:  Normal gait without ataxia. Normal speech and language. No gross focal neurologic  deficits are appreciated. Skin:  Skin is warm, dry and intact. Multiple areas on the upper and lower extremities, trunk and neck with erythematous bases papules and vesicles. Some are individual and others are in clusters. This is consistent with contact dermatitis. Psychiatric: Mood and affect are normal. Patient exhibits appropriate insight and judgment.  INITIAL IMPRESSION / ASSESSMENT AND PLAN / ED COURSE  Patient was given Decadron 10 mg in the department and he is aware that this may increase his blood sugars. He states he checks them 4 times a day and is aware. He states he will do anything at this time because he is miserable. He is also encouraged to take Benadryl one or 2 capsules every 6 hours as needed for itching. He'll also start on a prednisone Dosepak. He'll follow-up with Dr. Nehemiah Massed at [name] skin center if any continued problems. He was also given a prescription for a long cream to apply to areas as needed for itching.    ____________________________________________  FINAL CLINICAL IMPRESSION(S) / ED DIAGNOSES  Final diagnoses:  Allergic contact dermatitis due to plants, except food     Philomena Course 08/03/16 1546    Orbie Pyo, MD 08/04/16 [phone]

## 2016-08-03 NOTE — ED Notes (Signed)
See triage note   States he was working on a tree that was down in his yard  And was exposed to poison ivy  Areas noted to both arms and legs

## 2016-08-03 NOTE — Discharge Instructions (Signed)
Continue  regular medication. Begin taking prednisone as directed and using Kenalog cream to areas as needed for itching. may also continue using Benadryl 1 or 2 capsules every 6 hours as needed for itching. Prednisone can increase your blood sugars therefore checked them 4 times a day and be mindful of the foods that you are eating.  Follow up with [place] if any continued problems

## 2017-06-21 ENCOUNTER — Encounter: Payer: Self-pay | Admitting: *Deleted

## 2017-06-24 ENCOUNTER — Ambulatory Visit: Payer: Medicare HMO | Admitting: Anesthesiology

## 2017-06-24 ENCOUNTER — Ambulatory Visit
Admission: RE | Admit: 2017-06-24 | Discharge: 2017-06-24 | Disposition: A | Discharge status: Home / Self Care | Payer: Medicare HMO | Source: Ambulatory Visit | Attending: Unknown Physician Specialty | Admitting: Unknown Physician Specialty

## 2017-06-24 ENCOUNTER — Other Ambulatory Visit: Payer: Self-pay

## 2017-06-24 ENCOUNTER — Encounter: Payer: Self-pay | Admitting: *Deleted

## 2017-06-24 ENCOUNTER — Encounter
Admission: RE | Disposition: A | Discharge status: Home / Self Care | Payer: Self-pay | Source: Ambulatory Visit | Attending: Unknown Physician Specialty

## 2017-06-24 DIAGNOSIS — K621 Rectal polyp: Secondary | ICD-10-CM | POA: Insufficient documentation

## 2017-06-24 DIAGNOSIS — K640 First degree hemorrhoids: Secondary | ICD-10-CM | POA: Insufficient documentation

## 2017-06-24 DIAGNOSIS — K317 Polyp of stomach and duodenum: Secondary | ICD-10-CM | POA: Insufficient documentation

## 2017-06-24 DIAGNOSIS — Z1211 Encounter for screening for malignant neoplasm of colon: Secondary | ICD-10-CM | POA: Insufficient documentation

## 2017-06-24 DIAGNOSIS — K219 Gastro-esophageal reflux disease without esophagitis: Secondary | ICD-10-CM | POA: Diagnosis not present

## 2017-06-24 DIAGNOSIS — D125 Benign neoplasm of sigmoid colon: Secondary | ICD-10-CM | POA: Diagnosis not present

## 2017-06-24 DIAGNOSIS — E109 Type 1 diabetes mellitus without complications: Secondary | ICD-10-CM | POA: Diagnosis not present

## 2017-06-24 DIAGNOSIS — Z87891 Personal history of nicotine dependence: Secondary | ICD-10-CM | POA: Diagnosis not present

## 2017-06-24 DIAGNOSIS — Z794 Long term (current) use of insulin: Secondary | ICD-10-CM | POA: Diagnosis not present

## 2017-06-24 DIAGNOSIS — I10 Essential (primary) hypertension: Secondary | ICD-10-CM | POA: Insufficient documentation

## 2017-06-24 DIAGNOSIS — Z79899 Other long term (current) drug therapy: Secondary | ICD-10-CM | POA: Insufficient documentation

## 2017-06-24 DIAGNOSIS — E7800 Pure hypercholesterolemia, unspecified: Secondary | ICD-10-CM | POA: Insufficient documentation

## 2017-06-24 DIAGNOSIS — D123 Benign neoplasm of transverse colon: Secondary | ICD-10-CM | POA: Insufficient documentation

## 2017-06-24 DIAGNOSIS — F419 Anxiety disorder, unspecified: Secondary | ICD-10-CM | POA: Insufficient documentation

## 2017-06-24 HISTORY — PX: ESOPHAGOGASTRODUODENOSCOPY (EGD) WITH PROPOFOL: SHX5813

## 2017-06-24 HISTORY — DX: Gastro-esophageal reflux disease without esophagitis: K21.9

## 2017-06-24 HISTORY — PX: COLONOSCOPY WITH PROPOFOL: SHX5780

## 2017-06-24 LAB — GLUCOSE, CAPILLARY
Glucose-Capillary: 92 mg/dL (ref 65–99)
Glucose-Capillary: 117 mg/dL — ABNORMAL HIGH (ref 65–99)

## 2017-06-24 SURGERY — COLONOSCOPY WITH PROPOFOL
Anesthesia: General

## 2017-06-24 MED ORDER — SODIUM CHLORIDE 0.9 % IV SOLN: INTRAVENOUS | Status: DC

## 2017-06-24 MED ORDER — FENTANYL CITRATE (PF) 100 MCG/2ML IJ SOLN
INTRAMUSCULAR | Status: DC | PRN
  Administered 2017-06-24: 50 ug via INTRAVENOUS

## 2017-06-24 MED ORDER — PHENYLEPHRINE HCL 10 MG/ML IJ SOLN
INTRAMUSCULAR | Status: DC | PRN
  Administered 2017-06-24 (×7): 50 ug via INTRAVENOUS

## 2017-06-24 MED ORDER — PROPOFOL 500 MG/50ML IV EMUL
INTRAVENOUS | Status: AC
  Filled 2017-06-24: qty 50

## 2017-06-24 MED ORDER — EPHEDRINE SULFATE 50 MG/ML IJ SOLN
INTRAMUSCULAR | Status: DC | PRN
  Administered 2017-06-24 (×2): 10 mg via INTRAVENOUS

## 2017-06-24 MED ORDER — ONDANSETRON HCL 4 MG/2ML IJ SOLN
INTRAMUSCULAR | Status: DC | PRN
  Administered 2017-06-24: 4 mg via INTRAVENOUS

## 2017-06-24 MED ORDER — LIDOCAINE HCL (PF) 2 % IJ SOLN
INTRAMUSCULAR | Status: AC
  Filled 2017-06-24: qty 10

## 2017-06-24 MED ORDER — LIDOCAINE HCL (CARDIAC) PF 100 MG/5ML IV SOSY
PREFILLED_SYRINGE | INTRAVENOUS | Status: DC | PRN
  Administered 2017-06-24: 60 mg via INTRAVENOUS

## 2017-06-24 MED ORDER — PROPOFOL 500 MG/50ML IV EMUL
INTRAVENOUS | Status: DC | PRN
Start: 2017-06-24 — End: 2017-06-24
  Administered 2017-06-24: 120 ug/kg/min via INTRAVENOUS

## 2017-06-24 MED ORDER — MIDAZOLAM HCL 2 MG/2ML IJ SOLN
INTRAMUSCULAR | Status: DC | PRN
  Administered 2017-06-24: 2 mg via INTRAVENOUS

## 2017-06-24 MED ORDER — MIDAZOLAM HCL 2 MG/2ML IJ SOLN
INTRAMUSCULAR | Status: AC
  Filled 2017-06-24: qty 2

## 2017-06-24 MED ORDER — FENTANYL CITRATE (PF) 100 MCG/2ML IJ SOLN
INTRAMUSCULAR | Status: AC
  Filled 2017-06-24: qty 2

## 2017-06-24 MED ORDER — SODIUM CHLORIDE 0.9 % IV SOLN
INTRAVENOUS | Status: DC
  Administered 2017-06-24: 08:00:00 via INTRAVENOUS

## 2017-06-24 NOTE — Op Note (Signed)
[facility] Gastroenterology Patient Name: Leonard Johnston Procedure Date: 06/24/2017 8:34 AM MRN: [id number] Account #: [id number] Date of Birth: May 17, 1965 Admit Type: Outpatient Age: 52 Room: [facility] ENDO ROOM 3 Gender: Male Note Status: Finalized Procedure:            Upper GI endoscopy Indications:          Dysphagia Providers:            Manya Silvas, MD Referring MD:         Marga Hoots NP (Referring MD), Caprice Renshaw MD (Referring MD) Medicines:            Propofol per Anesthesia Complications:        No immediate complications. Procedure:            Pre-Anesthesia Assessment:                       - After reviewing the risks and benefits, the patient                        was deemed in satisfactory condition to undergo the                        procedure.                       After obtaining informed consent, the endoscope was                        passed under direct vision. Throughout the procedure,                        the patient's blood pressure, pulse, and oxygen                        saturations were monitored continuously. The Endoscope                        was introduced through the mouth, and advanced to the                        jejunum. The upper GI endoscopy was accomplished                        without difficulty. The patient tolerated the procedure                        well. Findings:      The examined esophagus was normal. At the end of the procedure A       guidewire was placed and the scope was withdrawn. Dilation was performed       with a Savary dilator 71 F with mild resistance.      Three small sessile polyps with no bleeding and no stigmata of recent       bleeding were found in the gastric body. Biopsies were taken with a cold       forceps for histology from two of them..      Diffuse mildly erythematous mucosa without bleeding was found in the  gastric body and in the gastric  antrum. Biopsies were taken with a cold       forceps for histology. Biopsies were taken with a cold forceps for       Helicobacter pylori testing.      The examined duodenum was normal. Impression:           - Normal esophagus. Dilated.                       - Three gastric polyps. Biopsied.                       - Erythematous mucosa in the gastric body and antrum.                        Biopsied.                       - Normal examined duodenum. Recommendation:       - Await pathology results. Take medicine for gastritis,                        Eat soft food for 3 days. Manya Silvas, MD 06/24/2017 9:56:37 AM This report has been signed electronically. Number of Addenda: 0 Note Initiated On: 06/24/2017 8:34 AM      [facility]

## 2017-06-24 NOTE — H&P (Signed)
Primary Care Physician:  Derinda Late, MD Primary Gastroenterologist:  Dr. Vira Agar  Pre-Procedure History & Physical: HPI:  Leonard Johnston is a 52 y.o. male is here for an endoscopy and colonoscopy. Done for colon cancer screening and dysphagia.   Past Medical History:  Diagnosis Date  . Anxiety   . Diabetes mellitus without complication ([place])    type 1  . GERD (gastroesophageal reflux disease)   . Hypercholesteremia     Past Surgical History:  Procedure Laterality Date  . amputaion of toe    . EYE SURGERY    . TOE SURGERY      Prior to Admission medications   Medication Sig Start Date End Date Taking? Authorizing Provider  insulin aspart (NOVOLOG FLEXPEN) 100 UNIT/ML FlexPen Inject into the skin 3 (three) times daily with meals.   Yes [provider]  omeprazole (PRILOSEC) 20 MG capsule Take 20 mg by mouth daily.   Yes [provider]  ranitidine (ZANTAC) 75 MG tablet Take 75 mg by mouth 2 (two) times daily.   Yes [provider]  Insulin Glargine (LANTUS SOLOSTAR) 100 UNIT/ML Solostar Pen Inject 30 Units into the skin daily at 10 pm.    [provider]  lamoTRIgine (LAMICTAL) 25 MG tablet Take 25 mg by mouth daily.    [provider]  mirtazapine (REMERON) 15 MG tablet Take 15 mg by mouth at bedtime.    [provider]  triamcinolone ointment (KENALOG) 0.5 % Apply 1 application topically 2 (two) times daily. Patient not taking: Reported on 06/24/2017 08/03/16   Johnn Hai, PA-C    Allergies as of 06/21/2017  . (Not on File)    History reviewed. No pertinent family history.  Social History   Socioeconomic History  . Marital status: Married    Spouse name: Not on file  . Number of children: Not on file  . Years of education: Not on file  . Highest education level: Not on file  Occupational History  . Not on file  Social Needs  . Financial resource strain: Not on file  . Food insecurity:    Worry: Not  on file    Inability: Not on file  . Transportation needs:    Medical: Not on file    Non-medical: Not on file  Tobacco Use  . Smoking status: Former Smoker    Last attempt to quit: 06/24/2009    Years since quitting: 8.0  . Smokeless tobacco: Never Used  Substance and Sexual Activity  . Alcohol use: No  . Drug use: No  . Sexual activity: Not on file  Lifestyle  . Physical activity:    Days per week: Not on file    Minutes per session: Not on file  . Stress: Not on file  Relationships  . Social connections:    Talks on phone: Not on file    Gets together: Not on file    Attends religious service: Not on file    Active member of club or organization: Not on file    Attends meetings of clubs or organizations: Not on file    Relationship status: Not on file  . Intimate partner violence:    Fear of current or ex partner: Not on file    Emotionally abused: Not on file    Physically abused: Not on file    Forced sexual activity: Not on file  Other Topics Concern  . Not on file  Social History Narrative  .  Not on file    Review of Systems: See HPI, otherwise negative ROS  Physical Exam: BP 109/76   Pulse 87   Resp 20   Ht 6\' 8"  (2.032 m)   Wt 81.6 kg (180 lb)   SpO2 100%   BMI 19.77 kg/m  General:   Alert,  pleasant and cooperative in NAD Head:  Normocephalic and atraumatic. Neck:  Supple; no masses or thyromegaly. Lungs:  Clear throughout to auscultation.    Heart:  Regular rate and rhythm. Abdomen:  Soft, nontender and nondistended. Normal bowel sounds, without guarding, and without rebound.   Neurologic:  Alert and  oriented x4;  grossly normal neurologically.  Impression/Plan: Leonard Johnston is here for an endoscopy and colonoscopy to be performed for dysphagia and colon cancer screening.  Risks, benefits, limitations, and alternatives regarding  endoscopy and colonoscopy have been reviewed with the patient.  Questions have been answered.  All parties  agreeable.   Gaylyn Cheers, MD  06/24/2017, 8:35 AM

## 2017-06-24 NOTE — Anesthesia Post-op Follow-up Note (Signed)
Anesthesia QCDR form completed.        

## 2017-06-24 NOTE — Transfer of Care (Signed)
Immediate Anesthesia Transfer of Care Note  Patient: Leonard Johnston  Procedure(s) Performed: COLONOSCOPY WITH PROPOFOL (N/A ) ESOPHAGOGASTRODUODENOSCOPY (EGD) WITH PROPOFOL (N/A )  Patient Location: PACU  Anesthesia Type:General  Level of Consciousness: awake and sedated  Airway & Oxygen Therapy: Patient Spontanous Breathing and Patient connected to nasal cannula oxygen  Post-op Assessment: Report given to RN and Post -op Vital signs reviewed and stable  Post vital signs: Reviewed and stable  Last Vitals:  Vitals Value Taken Time  BP    Temp    Pulse    Resp    SpO2      Last Pain:  Vitals:   06/24/17 0827  PainSc: 0-No pain         Complications: No apparent anesthesia complications

## 2017-06-24 NOTE — Anesthesia Preprocedure Evaluation (Signed)
Anesthesia Evaluation  Patient identified by MRN, date of birth, ID band Patient awake    Reviewed: Allergy & Precautions, NPO status , Patient's Chart, lab work & pertinent test results  History of Anesthesia Complications Negative for: history of anesthetic complications  Airway Mallampati: I  TM Distance: >3 FB Neck ROM: Full    Dental  (+) Upper Dentures, Lower Dentures   Pulmonary neg sleep apnea, neg COPD, former smoker,    breath sounds clear to auscultation- rhonchi (-) wheezing      Cardiovascular Exercise Tolerance: Good (-) hypertension(-) CAD, (-) Past MI, (-) Cardiac Stents and (-) CABG  Rhythm:Regular Rate:Normal - Systolic murmurs and - Diastolic murmurs    Neuro/Psych Anxiety negative neurological ROS     GI/Hepatic Neg liver ROS, GERD  ,  Endo/Other  diabetes, Type 1, Insulin Dependent  Renal/GU negative Renal ROS     Musculoskeletal negative musculoskeletal ROS (+)   Abdominal (+) - obese,   Peds  Hematology negative hematology ROS (+)   Anesthesia Other Findings Past Medical History: No date: Anxiety No date: Diabetes mellitus without complication ([facility])     Comment:  type 1 No date: GERD (gastroesophageal reflux disease) No date: Hypercholesteremia   Reproductive/Obstetrics                             Anesthesia Physical Anesthesia Plan  ASA: II  Anesthesia Plan: General   Post-op Pain Management:    Induction: Intravenous  PONV Risk Score and Plan: 1 and Propofol infusion  Airway Management Planned: Natural Airway  Additional Equipment:   Intra-op Plan:   Post-operative Plan:   Informed Consent: I have reviewed the patients History and Physical, chart, labs and discussed the procedure including the risks, benefits and alternatives for the proposed anesthesia with the patient or authorized representative who has indicated his/her understanding and  acceptance.   Dental advisory given  Plan Discussed with: CRNA and Anesthesiologist  Anesthesia Plan Comments:         Anesthesia Quick Evaluation

## 2017-06-24 NOTE — Op Note (Signed)
[facility] Patient Name: Leonard Johnston Procedure Date: 06/24/2017 8:36 AM MRN: [id number] Account #: [id number] Date of Birth: [birth year] Admit Type: Outpatient Age: 52 Room: [facility] ENDO ROOM 3 Gender: Male Note Status: Finalized Procedure:            Colonoscopy Indications:          Screening for colorectal malignant neoplasm Providers:            Manya Silvas, MD Referring MD:         Caprice Renshaw MD (Referring MD) Medicines:            Propofol per Anesthesia Complications:        No immediate complications. Procedure:            Pre-Anesthesia Assessment:                       - After reviewing the risks and benefits, the patient                        was deemed in satisfactory condition to undergo the                        procedure.                       After obtaining informed consent, the colonoscope was                        passed under direct vision. Throughout the procedure,                        the patient's blood pressure, pulse, and oxygen                        saturations were monitored continuously. The                        Colonoscope was introduced through the anus and                        advanced to the the cecum, identified by appendiceal                        orifice and ileocecal valve. The colonoscopy was                        performed without difficulty. The patient tolerated the                        procedure well. The quality of the bowel preparation                        was adequate to identify polyps. Findings:      Five sessile polyps were found in the rectum, recto-sigmoid colon,       sigmoid colon, transverse colon and hepatic flexure. The polyps were       small in size. These polyps were removed with a hot snare. Resection and       retrieval were complete.      A diminutive polyp was found in the sigmoid colon. The polyp was  sessile. The polyp was removed with a jumbo cold  forceps. Resection and       retrieval were complete.      Internal hemorrhoids were found during endoscopy. The hemorrhoids were       small and Grade I (internal hemorrhoids that do not prolapse).      The exam was otherwise without abnormality. Impression:           - Five small polyps in the rectum, at the recto-sigmoid                        colon, in the sigmoid colon, in the transverse colon                        and at the hepatic flexure, removed with a hot snare.                        Resected and retrieved.                       - One diminutive polyp in the sigmoid colon, removed                        with a jumbo cold forceps. Resected and retrieved.                       - Internal hemorrhoids.                       - The examination was otherwise normal. Recommendation:       - Await pathology results. Manya Silvas, MD 06/24/2017 9:38:17 AM This report has been signed electronically. Number of Addenda: 0 Note Initiated On: 06/24/2017 8:36 AM Scope Withdrawal Time: 0 hours 31 minutes 51 seconds  Total Procedure Duration: 0 hours 41 minutes 48 seconds       [facility]

## 2017-06-24 NOTE — Anesthesia Procedure Notes (Signed)
Performed by: Vaughan Sine Pre-anesthesia Checklist: Patient identified, Emergency Drugs available, Suction available, Patient being monitored and Timeout performed Patient Re-evaluated:Patient Re-evaluated prior to induction Oxygen Delivery Method: Nasal cannula Preoxygenation: Pre-oxygenation with 100% oxygen Induction Type: IV induction Ventilation: Oral airway inserted - appropriate to patient size Airway Equipment and Method: Bite block and Oral airway Placement Confirmation: CO2 detector and positive ETCO2

## 2017-06-24 NOTE — Anesthesia Postprocedure Evaluation (Signed)
Anesthesia Post Note  Patient: Leonard Johnston  Procedure(s) Performed: COLONOSCOPY WITH PROPOFOL (N/A ) ESOPHAGOGASTRODUODENOSCOPY (EGD) WITH PROPOFOL (N/A )  Patient location during evaluation: Endoscopy Anesthesia Type: General Level of consciousness: awake and alert and oriented Pain management: pain level controlled Vital Signs Assessment: post-procedure vital signs reviewed and stable Respiratory status: spontaneous breathing, nonlabored ventilation and respiratory function stable Cardiovascular status: blood pressure returned to baseline and stable Postop Assessment: no signs of nausea or vomiting Anesthetic complications: no     Last Vitals:  Vitals:   06/24/17 0827 06/24/17 0957  BP: 109/76   Pulse: 87 73  Resp: 20 18  Temp:  36.7 C  SpO2: 100% 100%    Last Pain:  Vitals:   06/24/17 1017  PainSc: 0-No pain                 [name]

## 2017-06-25 ENCOUNTER — Encounter: Payer: Self-pay | Admitting: Unknown Physician Specialty

## 2017-06-26 LAB — SURGICAL PATHOLOGY

## 2017-08-13 ENCOUNTER — Other Ambulatory Visit: Payer: Self-pay | Admitting: Sports Medicine

## 2017-08-13 DIAGNOSIS — M7541 Impingement syndrome of right shoulder: Secondary | ICD-10-CM

## 2017-08-13 DIAGNOSIS — G8929 Other chronic pain: Secondary | ICD-10-CM

## 2017-08-13 DIAGNOSIS — S4991XA Unspecified injury of right shoulder and upper arm, initial encounter: Secondary | ICD-10-CM

## 2017-08-13 DIAGNOSIS — M25511 Pain in right shoulder: Secondary | ICD-10-CM

## 2017-09-03 ENCOUNTER — Ambulatory Visit
Admission: RE | Admit: 2017-09-03 | Discharge: 2017-09-03 | Disposition: A | Discharge status: Home / Self Care | Payer: Medicare HMO | Source: Ambulatory Visit | Attending: Sports Medicine | Admitting: Sports Medicine

## 2017-09-03 DIAGNOSIS — M19011 Primary osteoarthritis, right shoulder: Secondary | ICD-10-CM | POA: Insufficient documentation

## 2017-09-03 DIAGNOSIS — M25511 Pain in right shoulder: Secondary | ICD-10-CM | POA: Insufficient documentation

## 2017-09-03 DIAGNOSIS — G8929 Other chronic pain: Secondary | ICD-10-CM | POA: Diagnosis present

## 2017-09-03 DIAGNOSIS — M65811 Other synovitis and tenosynovitis, right shoulder: Secondary | ICD-10-CM | POA: Insufficient documentation

## 2017-09-03 DIAGNOSIS — M7541 Impingement syndrome of right shoulder: Secondary | ICD-10-CM

## 2017-09-03 DIAGNOSIS — S4991XA Unspecified injury of right shoulder and upper arm, initial encounter: Secondary | ICD-10-CM | POA: Diagnosis present

## 2017-10-29 ENCOUNTER — Other Ambulatory Visit: Payer: Self-pay

## 2017-10-29 ENCOUNTER — Ambulatory Visit (INDEPENDENT_AMBULATORY_CARE_PROVIDER_SITE_OTHER): Payer: Medicare HMO | Admitting: Licensed Clinical Social Worker

## 2017-10-29 ENCOUNTER — Ambulatory Visit: Payer: Medicare HMO | Admitting: Psychiatry

## 2017-10-29 ENCOUNTER — Encounter: Payer: Self-pay | Admitting: Psychiatry

## 2017-10-29 VITALS — BP 151/90 | HR 80 | Temp 97.6°F | Ht >= 80 in | Wt 173.8 lb

## 2017-10-29 DIAGNOSIS — F321 Major depressive disorder, single episode, moderate: Secondary | ICD-10-CM

## 2017-10-29 DIAGNOSIS — F410 Panic disorder [episodic paroxysmal anxiety] without agoraphobia: Secondary | ICD-10-CM

## 2017-10-29 DIAGNOSIS — F429 Obsessive-compulsive disorder, unspecified: Secondary | ICD-10-CM

## 2017-10-29 DIAGNOSIS — F4310 Post-traumatic stress disorder, unspecified: Secondary | ICD-10-CM | POA: Diagnosis not present

## 2017-10-29 MED ORDER — SERTRALINE HCL 25 MG PO TABS: 25.0000 mg | ORAL_TABLET | Freq: Every day | ORAL | 0 refills | Status: DC

## 2017-10-29 MED ORDER — PRAZOSIN HCL 1 MG PO CAPS: 1.0000 mg | ORAL_CAPSULE | Freq: Every day | ORAL | 1 refills | Status: DC

## 2017-10-29 MED ORDER — HYDROXYZINE PAMOATE 25 MG PO CAPS: 25.0000 mg | ORAL_CAPSULE | Freq: Two times a day (BID) | ORAL | 1 refills | Status: DC | PRN
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
## 2017-10-29 NOTE — Patient Instructions (Signed)
Hydroxyzine capsules or tablets What is this medicine? HYDROXYZINE (hye [place] i zeen) is an antihistamine. This medicine is used to treat allergy symptoms. It is also used to treat anxiety and tension. This medicine can be used with other medicines to induce sleep before surgery. This medicine may be used for other purposes; ask your health care provider or pharmacist if you have questions. COMMON BRAND NAME(S): ANX, Atarax, Rezine, Vistaril What should I tell my health care provider before I take this medicine? They need to know if you have any of these conditions: -any chronic illness -difficulty passing urine -glaucoma -heart disease -kidney disease -liver disease -lung disease -an unusual or allergic reaction to hydroxyzine, cetirizine, other medicines, foods, dyes, or preservatives -pregnant or trying to get pregnant -breast-feeding How should I use this medicine? Take this medicine by mouth with a full glass of water. Follow the directions on the prescription label. You may take this medicine with food or on an empty stomach. Take your medicine at regular intervals. Do not take your medicine more often than directed. Talk to your pediatrician regarding the use of this medicine in children. Special care may be needed. While this drug may be prescribed for children as young as 75 years of age for selected conditions, precautions do apply. Patients over 62 years old may have a stronger reaction and need a smaller dose. Overdosage: If you think you have taken too much of this medicine contact a poison control center or emergency room at once. NOTE: This medicine is only for you. Do not share this medicine with others. What if I miss a dose? If you miss a dose, take it as soon as you can. If it is almost time for your next dose, take only that dose. Do not take double or extra doses. What may interact with this medicine? -alcohol -barbiturate medicines for sleep or seizures -medicines for  colds, allergies -medicines for depression, anxiety, or emotional disturbances -medicines for pain -medicines for sleep -muscle relaxants This list may not describe all possible interactions. Give your health care provider a list of all the medicines, herbs, non-prescription drugs, or dietary supplements you use. Also tell them if you smoke, drink alcohol, or use illegal drugs. Some items may interact with your medicine. What should I watch for while using this medicine? Tell your doctor or health care professional if your symptoms do not improve. You may get drowsy or dizzy. Do not drive, use machinery, or do anything that needs mental alertness until you know how this medicine affects you. Do not stand or sit up quickly, especially if you are an older patient. This reduces the risk of dizzy or fainting spells. Alcohol may interfere with the effect of this medicine. Avoid alcoholic drinks. Your mouth may get dry. Chewing sugarless gum or sucking hard candy, and drinking plenty of water may help. Contact your doctor if the problem does not go away or is severe. This medicine may cause dry eyes and blurred vision. If you wear contact lenses you may feel some discomfort. Lubricating drops may help. See your eye doctor if the problem does not go away or is severe. If you are receiving skin tests for allergies, tell your doctor you are using this medicine. What side effects may I notice from receiving this medicine? Side effects that you should report to your doctor or health care professional as soon as possible: -fast or irregular heartbeat -difficulty passing urine -seizures -slurred speech or confusion -tremor Side effects that  usually do not require medical attention (report to your doctor or health care professional if they continue or are bothersome): -constipation -drowsiness -fatigue -headache -stomach upset This list may not describe all possible side effects. Call your doctor for  medical advice about side effects. You may report side effects to FDA at 1-800-FDA-1088. Where should I keep my medicine? Keep out of the reach of children. Store at room temperature between 15 and 30 degrees C (59 and 86 degrees F). Keep container tightly closed. Throw away any unused medicine after the expiration date. NOTE: This sheet is a summary. It may not cover all possible information. If you have questions about this medicine, talk to your doctor, pharmacist, or health care provider.  2018 Elsevier/Gold Standard (2007-06-27 14:50:59) Prazosin capsules What is this medicine? PRAZOSIN (PRA zoe sin) is an antihypertensive. It works by relaxing the blood vessels. It is used to treat high blood pressure. This medicine may be used for other purposes; ask your health care provider or pharmacist if you have questions. COMMON BRAND NAME(S): Minipress What should I tell my health care provider before I take this medicine? They need to know if you have any of the following conditions: -kidney disease -an unusual or allergic reaction to prazosin, other medicines, foods, dyes, or preservatives -pregnant or trying to get pregnant -breast-feeding How should I use this medicine? Take this medicine by mouth with a glass of water. Follow the directions on the prescription label. Take your doses at regular intervals. Do not take your medicine more often than directed. Do not stop taking except on the advice of your doctor or health care professional. Talk to your pediatrician regarding the use of this medicine in children. Special care may be needed. Overdosage: If you think you have taken too much of this medicine contact a poison control center or emergency room at once. NOTE: This medicine is only for you. Do not share this medicine with others. What if I miss a dose? If you miss a dose, take it as soon as you can. If it is almost time for your next dose, take only that dose. Do not take double or  extra doses. What may interact with this medicine? -diuretics -medicines for high blood pressure -sildenafil -tadalafil -vardenafil This list may not describe all possible interactions. Give your health care provider a list of all the medicines, herbs, non-prescription drugs, or dietary supplements you use. Also tell them if you smoke, drink alcohol, or use illegal drugs. Some items may interact with your medicine. What should I watch for while using this medicine? Visit your doctor or health care professional for regular checks on your progress. Check your blood pressure regularly. Ask your doctor or health care professional what your blood pressure should be and when you should contact him or her. Drowsiness and dizziness are more likely to occur after the first dose, after an increase in dose, or during hot weather or exercise. These effects can decrease once your body adjusts to this medicine. Do not drive, use machinery, or do anything that needs mental alertness until you know how this drug affects you. Do not stand or sit up quickly, especially if you are an older patient. This reduces the risk of dizzy or fainting spells. Alcohol can make you more drowsy and dizzy. Avoid alcoholic drinks. Do not treat yourself for coughs, colds or allergies without asking your doctor or health care professional for advice. Some ingredients can increase your blood pressure. Your mouth may get  dry. Chewing sugarless gum or sucking hard candy, and drinking plenty of water may help. Contact your doctor if the problem does not go away or is severe. For males, contact your doctor or health care professional right away if you have an erection that lasts longer than 4 hours or if it becomes painful. This may be a sign of a serious problem and must be treated right away to prevent permanent damage. What side effects may I notice from receiving this medicine? Side effects that you should report to your doctor or health  care professional as soon as possible: -blurred vision -difficulty breathing, shortness of breath -fainting spells, light headedness -fast or irregular heartbeat, palpitations or chest pain -numbness in hands or feet -prolonged painful erection of the penis -swelling of the legs or ankles -unusually weak or tired Side effects that usually do not require medical attention (report to your doctor or health care professional if they continue or are bothersome): -constipation or diarrhea -headache -nausea -sexual difficulties -stomach pain This list may not describe all possible side effects. Call your doctor for medical advice about side effects. You may report side effects to FDA at 1-800-FDA-1088. Where should I keep my medicine? Keep out of the reach of children. Store at room temperature between 15 and 30 degrees C (59 and 86 degrees F). Protect from light. Keep container tightly closed. Throw away any unused medicine after the expiration date. NOTE: This sheet is a summary. It may not cover all possible information. If you have questions about this medicine, talk to your doctor, pharmacist, or health care provider.  2018 Elsevier/Gold Standard (2013-08-10 09:13:50) Sertraline tablets What is this medicine? SERTRALINE (SER tra leen) is used to treat depression. It may also be used to treat obsessive compulsive disorder, panic disorder, post-trauma stress, premenstrual dysphoric disorder (PMDD) or social anxiety. This medicine may be used for other purposes; ask your health care provider or pharmacist if you have questions. COMMON BRAND NAME(S): Zoloft What should I tell my health care provider before I take this medicine? They need to know if you have any of these conditions: -bleeding disorders -bipolar disorder or a family history of bipolar disorder -glaucoma -heart disease -high blood pressure -history of irregular heartbeat -history of low levels of calcium, magnesium, or  potassium in the blood -if you often drink alcohol -liver disease -receiving electroconvulsive therapy -seizures -suicidal thoughts, plans, or attempt; a previous suicide attempt by you or a family member -take medicines that treat or prevent blood clots -thyroid disease -an unusual or allergic reaction to sertraline, other medicines, foods, dyes, or preservatives -pregnant or trying to get pregnant -breast-feeding How should I use this medicine? Take this medicine by mouth with a glass of water. Follow the directions on the prescription label. You can take it with or without food. Take your medicine at regular intervals. Do not take your medicine more often than directed. Do not stop taking this medicine suddenly except upon the advice of your doctor. Stopping this medicine too quickly may cause serious side effects or your condition may worsen. A special MedGuide will be given to you by the pharmacist with each prescription and refill. Be sure to read this information carefully each time. Talk to your pediatrician regarding the use of this medicine in children. While this drug may be prescribed for children as young as 7 years for selected conditions, precautions do apply. Overdosage: If you think you have taken too much of this medicine contact a poison control  center or emergency room at once. NOTE: This medicine is only for you. Do not share this medicine with others. What if I miss a dose? If you miss a dose, take it as soon as you can. If it is almost time for your next dose, take only that dose. Do not take double or extra doses. What may interact with this medicine? Do not take this medicine with any of the following medications: -cisapride -dofetilide -dronedarone -linezolid -MAOIs like Carbex, Eldepryl, Marplan, Nardil, and Parnate -methylene blue (injected into a vein) -pimozide -thioridazine This medicine may also interact with the following  medications: -alcohol -amphetamines -aspirin and aspirin-like medicines -certain medicines for depression, anxiety, or psychotic disturbances -certain medicines for fungal infections like ketoconazole, fluconazole, posaconazole, and itraconazole -certain medicines for irregular heart beat like flecainide, quinidine, propafenone -certain medicines for migraine headaches like almotriptan, eletriptan, frovatriptan, naratriptan, rizatriptan, sumatriptan, zolmitriptan -certain medicines for sleep -certain medicines for seizures like carbamazepine, valproic acid, phenytoin -certain medicines that treat or prevent blood clots like warfarin, enoxaparin, dalteparin -cimetidine -digoxin -diuretics -fentanyl -isoniazid -lithium -NSAIDs, medicines for pain and inflammation, like ibuprofen or naproxen -other medicines that prolong the QT interval (cause an abnormal heart rhythm) -rasagiline -safinamide -supplements like St. John's wort, kava kava, valerian -tolbutamide -tramadol -tryptophan This list may not describe all possible interactions. Give your health care provider a list of all the medicines, herbs, non-prescription drugs, or dietary supplements you use. Also tell them if you smoke, drink alcohol, or use illegal drugs. Some items may interact with your medicine. What should I watch for while using this medicine? Tell your doctor if your symptoms do not get better or if they get worse. Visit your doctor or health care professional for regular checks on your progress. Because it may take several weeks to see the full effects of this medicine, it is important to continue your treatment as prescribed by your doctor. Patients and their families should watch out for new or worsening thoughts of suicide or depression. Also watch out for sudden changes in feelings such as feeling anxious, agitated, panicky, irritable, hostile, aggressive, impulsive, severely restless, overly excited and hyperactive,  or not being able to sleep. If this happens, especially at the beginning of treatment or after a change in dose, call your health care professional. Dennis Bast may get drowsy or dizzy. Do not drive, use machinery, or do anything that needs mental alertness until you know how this medicine affects you. Do not stand or sit up quickly, especially if you are an older patient. This reduces the risk of dizzy or fainting spells. Alcohol may interfere with the effect of this medicine. Avoid alcoholic drinks. Your mouth may get dry. Chewing sugarless gum or sucking hard candy, and drinking plenty of water may help. Contact your doctor if the problem does not go away or is severe. What side effects may I notice from receiving this medicine? Side effects that you should report to your doctor or health care professional as soon as possible: -allergic reactions like skin rash, itching or hives, swelling of the face, lips, or tongue -anxious -black, tarry stools -changes in vision -confusion -elevated mood, decreased need for sleep, racing thoughts, impulsive behavior -eye pain -fast, irregular heartbeat -feeling faint or lightheaded, falls -feeling agitated, angry, or irritable -hallucination, loss of contact with reality -loss of balance or coordination -loss of memory -painful or prolonged erections -restlessness, pacing, inability to keep still -seizures -stiff muscles -suicidal thoughts or other mood changes -trouble sleeping -unusual bleeding  or bruising -unusually weak or tired -vomiting Side effects that usually do not require medical attention (report to your doctor or health care professional if they continue or are bothersome): -change in appetite or weight -change in sex drive or performance -diarrhea -increased sweating -indigestion, nausea -tremors This list may not describe all possible side effects. Call your doctor for medical advice about side effects. You may report side effects to  FDA at 1-800-FDA-1088. Where should I keep my medicine? Keep out of the reach of children. Store at room temperature between 15 and 30 degrees C (59 and 86 degrees F). Throw away any unused medicine after the expiration date. NOTE: This sheet is a summary. It may not cover all possible information. If you have questions about this medicine, talk to your doctor, pharmacist, or health care provider.  2018 Elsevier/Gold Standard (2016-02-17 14:17:49)

## 2017-11-07 ENCOUNTER — Ambulatory Visit (INDEPENDENT_AMBULATORY_CARE_PROVIDER_SITE_OTHER): Payer: Medicare HMO | Admitting: Licensed Clinical Social Worker

## 2017-11-07 ENCOUNTER — Encounter: Payer: Self-pay | Admitting: Licensed Clinical Social Worker

## 2017-11-07 DIAGNOSIS — F4310 Post-traumatic stress disorder, unspecified: Secondary | ICD-10-CM
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
## 2017-11-12 ENCOUNTER — Other Ambulatory Visit: Payer: Self-pay

## 2017-11-12 ENCOUNTER — Encounter: Payer: Self-pay | Admitting: Psychiatry

## 2017-11-12 ENCOUNTER — Ambulatory Visit: Payer: Medicare HMO | Admitting: Psychiatry

## 2017-11-12 VITALS — BP 100/67 | HR 88 | Temp 98.8°F | Wt 174.0 lb

## 2017-11-12 DIAGNOSIS — F4310 Post-traumatic stress disorder, unspecified: Secondary | ICD-10-CM | POA: Diagnosis not present

## 2017-11-12 DIAGNOSIS — F410 Panic disorder [episodic paroxysmal anxiety] without agoraphobia: Secondary | ICD-10-CM

## 2017-11-12 DIAGNOSIS — F321 Major depressive disorder, single episode, moderate: Secondary | ICD-10-CM | POA: Diagnosis not present

## 2017-11-12 DIAGNOSIS — F429 Obsessive-compulsive disorder, unspecified: Secondary | ICD-10-CM | POA: Diagnosis not present

## 2017-11-12 MED ORDER — TRAZODONE HCL 50 MG PO TABS: 25.0000 mg | ORAL_TABLET | Freq: Every evening | ORAL | 1 refills | Status: DC | PRN

## 2017-11-12 MED ORDER — PROPRANOLOL HCL 10 MG PO TABS: 10.0000 mg | ORAL_TABLET | Freq: Three times a day (TID) | ORAL | 1 refills | Status: DC | PRN

## 2017-11-12 MED ORDER — SERTRALINE HCL 50 MG PO TABS: 50.0000 mg | ORAL_TABLET | Freq: Every day | ORAL | 0 refills | Status: DC

## 2017-11-12 MED ORDER — PRAZOSIN HCL 2 MG PO CAPS: 2.0000 mg | ORAL_CAPSULE | Freq: Every day | ORAL | 0 refills | Status: DC

## 2017-11-12 NOTE — Progress Notes (Signed)
[facility] OP Progress Note  11/12/2017 5:11 PM Leonard Johnston  MRN:  [id number]  Chief Complaint: ' I am here for follow up." Chief Complaint    Follow-up; Medication Refill; Anxiety; Depression; Insomnia     HPI: Leonard Johnston is a 52 yr old Caucasian male who is married, on disability, lives in [place], has a history of being legally blind, insulin-dependent diabetes mellitus, hyperlipidemia, PTSD, panic disorder, MDD, OCD, presented to the clinic today for a follow-up visit.  Patient presented along with his daughter Leonard Johnston today.  Patient today reports he is tolerating the Zoloft and his prazosin well.  He has not noticed any side effects.  He however reports he continues to feel agitated, anxious and depressed often.  He does not know if the Zoloft at this dosage has started working on not.  He also reports taking hydroxyzine as needed a few times when he was on edge and restless and does not think it had any impact on his restlessness.  Patient reports he continues to have sleep problems.  He reports he has also noticed some sleep initiation problems.  He continues to have nightmares.  He is tolerating the prazosin well and is interested in increasing his dosage.  Patient denies any suicidality.  Patient denies any perceptual disturbances.  Patient has started psychotherapy sessions with Merleen Nicely which is going well.  Visit Diagnosis:    ICD-10-CM   1. PTSD (post-traumatic stress disorder) F43.10 prazosin (MINIPRESS) 2 MG capsule    sertraline (ZOLOFT) 50 MG tablet    traZODone (DESYREL) 50 MG tablet    propranolol (INDERAL) 10 MG tablet  2. Panic disorder F41.0 propranolol (INDERAL) 10 MG tablet  3. MDD (major depressive disorder), single episode, moderate (HCC) F32.1 sertraline (ZOLOFT) 50 MG tablet    traZODone (DESYREL) 50 MG tablet  4. Obsessive-compulsive disorder with good or fair insight F42.9     Past Psychiatric History: Reviewed past psychiatric history from my progress note on  10/29/2017.  Patient reports previous trials of Lamictal, Remeron and BuSpar  Past Medical History:  Past Medical History:  Diagnosis Date  . Anxiety   . Diabetes mellitus type I ([place])   . Diabetes mellitus without complication ([place])    type 1  . GERD (gastroesophageal reflux disease)   . Hypercholesteremia     Past Surgical History:  Procedure Laterality Date  . amputaion of toe    . COLONOSCOPY WITH PROPOFOL N/A 06/24/2017   Procedure: COLONOSCOPY WITH PROPOFOL;  Surgeon: Manya Silvas, MD;  Location: [facility] ENDOSCOPY;  Service: Endoscopy;  Laterality: N/A;  . ESOPHAGOGASTRODUODENOSCOPY (EGD) WITH PROPOFOL N/A 06/24/2017   Procedure: ESOPHAGOGASTRODUODENOSCOPY (EGD) WITH PROPOFOL;  Surgeon: Manya Silvas, MD;  Location: [facility] ENDOSCOPY;  Service: Endoscopy;  Laterality: N/A;  . EYE SURGERY    . TOE SURGERY      Family Psychiatric History: Reviewed family psychiatric history from my progress note on 10/29/2017.  Family History:  Family History  Problem Relation Age of Onset  . Alcohol abuse Sister   . Drug abuse Sister   . Alcohol abuse Sister   . Drug abuse Sister     Social History: Have reviewed social history from my progress note on 10/29/2017 Social History   Socioeconomic History  . Marital status: Married    Spouse name: jullie  . Number of children: 0  . Years of education: Not on file  . Highest education level: High school graduate  Occupational History  . Not on file  Social Needs  . Financial resource strain: Somewhat hard  . Food insecurity:    Worry: Often true    Inability: Often true  . Transportation needs:    Medical: No    Non-medical: No  Tobacco Use  . Smoking status: Former Smoker    Last attempt to quit: 06/24/2009    Years since quitting: 8.3  . Smokeless tobacco: Never Used  Substance and Sexual Activity  . Alcohol use: No  . Drug use: No  . Sexual activity: Yes  Lifestyle  . Physical activity:    Days per week: 0 days    Minutes  per session: 0 min  . Stress: Very much  Relationships  . Social connections:    Talks on phone: Not on file    Gets together: Not on file    Attends religious service: Never    Active member of club or organization: No    Attends meetings of clubs or organizations: Never    Relationship status: Married  Other Topics Concern  . Not on file  Social History Narrative  . Not on file    Allergies: No Known Allergies  Metabolic Disorder Labs: No results found for: HGBA1C, MPG No results found for: PROLACTIN No results found for: CHOL, TRIG, HDL, CHOLHDL, VLDL, LDLCALC No results found for: TSH  Therapeutic Level Labs: No results found for: LITHIUM No results found for: VALPROATE No components found for:  CBMZ  Current Medications: Current Outpatient Medications  Medication Sig Dispense Refill  . atorvastatin (LIPITOR) 10 MG tablet Take by mouth.    . insulin aspart (NOVOLOG FLEXPEN) 100 UNIT/ML FlexPen Inject into the skin 3 (three) times daily with meals.    . Insulin Glargine (BASAGLAR KWIKPEN) 100 UNIT/ML SOPN Inject into the skin.    . Multiple Vitamin (MULTI-VITAMINS) TABS Take by mouth.    Marland Kitchen omeprazole (PRILOSEC) 20 MG capsule Take 20 mg by mouth daily.    . ranitidine (ZANTAC) 75 MG tablet Take 75 mg by mouth 2 (two) times daily.    . prazosin (MINIPRESS) 2 MG capsule Take 1-2 capsules (2-4 mg total) by mouth at bedtime. Take 2 mg for the next 3 days and then start taking 4 mg after that . 30 capsule 0  . propranolol (INDERAL) 10 MG tablet Take 1 tablet (10 mg total) by mouth 3 (three) times daily as needed. Only for severe agitation/anxiety 90 tablet 1  . sertraline (ZOLOFT) 50 MG tablet Take 1 tablet (50 mg total) by mouth daily. 30 tablet 0  . traZODone (DESYREL) 50 MG tablet Take 0.5-1 tablets (25-50 mg total) by mouth at bedtime as needed for sleep. 30 tablet 1   No current facility-administered medications for this visit.      Musculoskeletal: Strength & Muscle  Tone: within normal limits Gait & Station: normal Patient leans: N/A  Psychiatric Specialty Exam: Review of Systems  Psychiatric/Behavioral: Positive for depression. The patient is nervous/anxious and has insomnia.   All other systems reviewed and are negative.   Blood pressure 100/67, pulse 88, temperature 98.8 F (37.1 C), temperature source Oral, weight 174 lb (78.9 kg).Body mass index is 19.11 kg/m.  General Appearance: Casual  Eye Contact:  Fair  Speech:  Normal Rate  Volume:  Normal  Mood:  Anxious and Dysphoric  Affect:  Congruent  Thought Process:  Goal Directed and Descriptions of Associations: Intact  Orientation:  Full (Time, Place, and Person)  Thought Content: Logical   Suicidal Thoughts:  No  Homicidal Thoughts:  No  Memory:  Immediate;   Fair Recent;   Fair Remote;   Fair  Judgement:  Fair  Insight:  Fair  Psychomotor Activity:  Normal  Concentration:  Concentration: Fair and Attention Span: Fair  Recall:  [employer] of Knowledge: Fair  Language: Fair  Akathisia:  No  Handed:  Right  AIMS (if indicated): na  Assets:  Communication Skills Desire for Improvement Housing Intimacy Social Support  ADL's:  Intact  Cognition: WNL  Sleep:  Poor   Screenings:   Assessment and Plan: Jonavan is a 52 year old Caucasian male, married, on disability, legally blind, lives in [place], has a history of PTSD, panic disorder, MDD, OCD, insulin-dependent diabetes mellitus, hyperlipidemia, presented to the clinic today for a follow-up visit.  Patient is biologically predisposed given his history of trauma as well as family history of mental health problems.  Patient also has medical problems as well as legal issues.  Patient reports he is tolerating the medication well however continues to struggle with mood symptoms and sleep issues.  Patient denies any suicidality and is motivated to stay on medications as well as therapy.  Plan as noted below.  Plan For  PTSD Increase Zoloft to 50 mg p.o. daily Discontinue hydroxyzine for lack of efficacy. Start propranolol 10 mg p.o. 3 times daily as needed for severe anxiety symptoms Increase prazosin to 2 mg for 3 days and to 4 mg after 3 days.  Patient is aware about the risk of orthostatic hypotension.  For MDD Zoloft 50 mg p.o. daily Continue psychotherapy with our therapist here in clinic  OCD Continue psychotherapy. Zoloft 50 mg p.o. daily  For insomnia Add trazodone 25-50 mg p.o. nightly as needed Discussed sleep hygiene techniques Increase prazosin to 2 mg for 3 days and then to 4 mg after 3 days. Discussed the risk of combining medications like trazodone and prazosin including priapism.  Follow-up in clinic in 2 weeks or sooner if needed.  More than 50 % of the time was spent for psychoeducation and supportive psychotherapy and care coordination.  This note was generated in part or whole with voice recognition software. Voice recognition is usually quite accurate but there are transcription errors that can and very often do occur. I apologize for any typographical errors that were not detected and corrected.         Ursula Alert, MD 11/12/2017, 5:11 PM

## 2017-11-12 NOTE — Patient Instructions (Signed)
Propranolol tablets  What is this medicine?  PROPRANOLOL (proe PRAN oh lole) is a beta-blocker. Beta-blockers reduce the workload on the heart and help it to beat more regularly. This medicine is used to treat high blood pressure, to control irregular heart rhythms (arrhythmias) and to relieve chest pain caused by angina. It may also be helpful after a heart attack. This medicine is also used to prevent migraine headaches, relieve uncontrollable shaking (tremors), and help certain problems related to the thyroid gland and adrenal gland.  This medicine may be used for other purposes; ask your health care provider or pharmacist if you have questions.  COMMON BRAND NAME(S): Inderal  What should I tell my health care provider before I take this medicine?  They need to know if you have any of these conditions:  -circulation problems or blood vessel disease  -diabetes  -history of heart attack or heart disease, vasospastic angina  -kidney disease  -liver disease  -lung or breathing disease, like asthma or emphysema  -pheochromocytoma  -slow heart rate  -thyroid disease  -an unusual or allergic reaction to propranolol, other beta-blockers, medicines, foods, dyes, or preservatives  -pregnant or trying to get pregnant  -breast-feeding  How should I use this medicine?  Take this medicine by mouth with a glass of water. Follow the directions on the prescription label. Take your doses at regular intervals. Do not take your medicine more often than directed. Do not stop taking except on your the advice of your doctor or health care professional.  Talk to your pediatrician regarding the use of this medicine in children. Special care may be needed.  Overdosage: If you think you have taken too much of this medicine contact a poison control center or emergency room at once.  NOTE: This medicine is only for you. Do not share this medicine with others.  What if I miss a dose?  If you miss a dose, take it as soon as you can. If it is  almost time for your next dose, take only that dose. Do not take double or extra doses.  What may interact with this medicine?  Do not take this medicine with any of the following medications:  -feverfew  -phenothiazines like chlorpromazine, mesoridazine, prochlorperazine, thioridazine  This medicine may also interact with the following medications:  -aluminum hydroxide gel  -antipyrine  -antiviral medicines for HIV or AIDS  -barbiturates like phenobarbital  -certain medicines for blood pressure, heart disease, irregular heart beat  -cimetidine  -ciprofloxacin  -diazepam  -fluconazole  -haloperidol  -isoniazid  -medicines for cholesterol like cholestyramine or colestipol  -medicines for mental depression  -medicines for migraine headache like almotriptan, eletriptan, frovatriptan, naratriptan, rizatriptan, sumatriptan, zolmitriptan  -NSAIDs, medicines for pain and inflammation, like ibuprofen or naproxen  -phenytoin  -rifampin  -teniposide  -theophylline  -thyroid medicines  -tolbutamide  -warfarin  -zileuton  This list may not describe all possible interactions. Give your health care provider a list of all the medicines, herbs, non-prescription drugs, or dietary supplements you use. Also tell them if you smoke, drink alcohol, or use illegal drugs. Some items may interact with your medicine.  What should I watch for while using this medicine?  Visit your doctor or health care professional for regular check ups. Check your blood pressure and pulse rate regularly. Ask your health care professional what your blood pressure and pulse rate should be, and when you should contact them.  You may get drowsy or dizzy. Do not drive, use machinery, or   you have diabetes, check with your doctor or health care professional before you change your diet or the dose of your diabetic medicine. Do not treat yourself for coughs, colds, or pain while you are taking this medicine without asking your doctor or health care professional for advice. Some ingredients may increase your blood pressure. What side effects may I notice from receiving this medicine? Side effects that you should report to your doctor or health care professional as soon as possible: -allergic reactions like skin rash, itching or hives, swelling of the face, lips, or tongue -breathing problems -changes in blood sugar -cold hands or feet -difficulty sleeping, nightmares -dry peeling skin -hallucinations -muscle cramps or weakness -slow heart rate -swelling of the legs and ankles -vomiting Side effects that usually do not require medical attention (report to your doctor or health care professional if they continue or are bothersome): -change in sex drive or performance -diarrhea -dry sore eyes -hair loss -nausea -weak or tired This list may not describe all possible side effects. Call your doctor for medical advice about side effects. You may report side effects to FDA at 1-800-FDA-1088. Where should I keep my medicine? Keep out of the reach of children. Store at room temperature between 15 and 30 degrees C (59 and 86 degrees F). Protect from light. Throw away any unused medicine after the expiration date. NOTE: This sheet is a summary. It may not cover all possible information. If you have questions about this medicine, talk to your doctor, pharmacist, or health care provider.  2018 Elsevier/Gold Standard (2012-10-17 14:51:53) Trazodone tablets What is this medicine? TRAZODONE (TRAZ oh done) is used to treat depression. This medicine may be used for other purposes; ask your health care provider or pharmacist if you have  questions. COMMON BRAND NAME(S): Desyrel What should I tell my health care provider before I take this medicine? They need to know if you have any of these conditions: -attempted suicide or thinking about it -bipolar disorder -bleeding problems -glaucoma -heart disease, or previous heart attack -irregular heart beat -kidney or liver disease -low levels of sodium in the blood -an unusual or allergic reaction to trazodone, other medicines, foods, dyes or preservatives -pregnant or trying to get pregnant -breast-feeding How should I use this medicine? Take this medicine by mouth with a glass of water. Follow the directions on the prescription label. Take this medicine shortly after a meal or a light snack. Take your medicine at regular intervals. Do not take your medicine more often than directed. Do not stop taking this medicine suddenly except upon the advice of your doctor. Stopping this medicine too quickly may cause serious side effects or your condition may worsen. A special MedGuide will be given to you by the pharmacist with each prescription and refill. Be sure to read this information carefully each time. Talk to your pediatrician regarding the use of this medicine in children. Special care may be needed. Overdosage: If you think you have taken too much of this medicine contact a poison control center or emergency room at once. NOTE: This medicine is only for you. Do not share this medicine with others. What if I miss a dose? If you miss a dose, take it as soon as you can. If it is almost time for your next dose, take only that dose. Do not take double or extra doses. What may interact with this medicine? Do not take this medicine with any of the following medications: -certain medicines for fungal infections like  fluconazole, itraconazole, ketoconazole, posaconazole, voriconazole -cisapride -dofetilide -dronedarone -linezolid -MAOIs like Carbex, Eldepryl, Marplan, Nardil, and  Parnate -mesoridazine -methylene blue (injected into a vein) -pimozide -saquinavir -thioridazine -ziprasidone This medicine may also interact with the following medications: -alcohol -antiviral medicines for HIV or AIDS -aspirin and aspirin-like medicines -barbiturates like phenobarbital -certain medicines for blood pressure, heart disease, irregular heart beat -certain medicines for depression, anxiety, or psychotic disturbances -certain medicines for migraine headache like almotriptan, eletriptan, frovatriptan, naratriptan, rizatriptan, sumatriptan, zolmitriptan -certain medicines for seizures like carbamazepine and phenytoin -certain medicines for sleep -certain medicines that treat or prevent blood clots like dalteparin, enoxaparin, warfarin -digoxin -fentanyl -lithium -NSAIDS, medicines for pain and inflammation, like ibuprofen or naproxen -other medicines that prolong the QT interval (cause an abnormal heart rhythm) -rasagiline -supplements like St. John's wort, kava kava, valerian -tramadol -tryptophan This list may not describe all possible interactions. Give your health care provider a list of all the medicines, herbs, non-prescription drugs, or dietary supplements you use. Also tell them if you smoke, drink alcohol, or use illegal drugs. Some items may interact with your medicine. What should I watch for while using this medicine? Tell your doctor if your symptoms do not get better or if they get worse. Visit your doctor or health care professional for regular checks on your progress. Because it may take several weeks to see the full effects of this medicine, it is important to continue your treatment as prescribed by your doctor. Patients and their families should watch out for new or worsening thoughts of suicide or depression. Also watch out for sudden changes in feelings such as feeling anxious, agitated, panicky, irritable, hostile, aggressive, impulsive, severely  restless, overly excited and hyperactive, or not being able to sleep. If this happens, especially at the beginning of treatment or after a change in dose, call your health care professional. Dennis Bast may get drowsy or dizzy. Do not drive, use machinery, or do anything that needs mental alertness until you know how this medicine affects you. Do not stand or sit up quickly, especially if you are an older patient. This reduces the risk of dizzy or fainting spells. Alcohol may interfere with the effect of this medicine. Avoid alcoholic drinks. This medicine may cause dry eyes and blurred vision. If you wear contact lenses you may feel some discomfort. Lubricating drops may help. See your eye doctor if the problem does not go away or is severe. Your mouth may get dry. Chewing sugarless gum, sucking hard candy and drinking plenty of water may help. Contact your doctor if the problem does not go away or is severe. What side effects may I notice from receiving this medicine? Side effects that you should report to your doctor or health care professional as soon as possible: -allergic reactions like skin rash, itching or hives, swelling of the face, lips, or tongue -elevated mood, decreased need for sleep, racing thoughts, impulsive behavior -confusion -fast, irregular heartbeat -feeling faint or lightheaded, falls -feeling agitated, angry, or irritable -loss of balance or coordination -painful or prolonged erections -restlessness, pacing, inability to keep still -suicidal thoughts or other mood changes -tremors -trouble sleeping -seizures -unusual bleeding or bruising Side effects that usually do not require medical attention (report to your doctor or health care professional if they continue or are bothersome): -change in sex drive or performance -change in appetite or weight -constipation -headache -muscle aches or pains -nausea This list may not describe all possible side effects. Call your doctor  for medical advice  about side effects. You may report side effects to FDA at 1-800-FDA-1088. Where should I keep my medicine? Keep out of the reach of children. Store at room temperature between 15 and 30 degrees C (59 to 86 degrees F). Protect from light. Keep container tightly closed. Throw away any unused medicine after the expiration date. NOTE: This sheet is a summary. It may not cover all possible information. If you have questions about this medicine, talk to your doctor, pharmacist, or health care provider.  2018 Elsevier/Gold Standard (2015-07-14 16:57:05)

## 2017-11-13 ENCOUNTER — Telehealth: Payer: Self-pay

## 2017-11-13 NOTE — Telephone Encounter (Signed)
Aware of interaction, patient is aware . He is taking it as needed and not daily . Will monitor closely. Please let [facility] know its ok to give.

## 2017-11-13 NOTE — Telephone Encounter (Signed)
[facility] faxed a notes stating prazosin hcl 2mg   " that drug interxn with propranolol and prazosin, please review and let them know."

## 2017-11-25 ENCOUNTER — Encounter: Payer: Self-pay | Admitting: Licensed Clinical Social Worker

## 2017-11-25 ENCOUNTER — Ambulatory Visit: Payer: Medicare HMO | Admitting: Psychiatry

## 2017-11-25 ENCOUNTER — Telehealth: Payer: Self-pay

## 2017-11-25 ENCOUNTER — Ambulatory Visit (INDEPENDENT_AMBULATORY_CARE_PROVIDER_SITE_OTHER): Payer: Medicare HMO | Admitting: Licensed Clinical Social Worker

## 2017-11-25 DIAGNOSIS — F4310 Post-traumatic stress disorder, unspecified: Secondary | ICD-10-CM | POA: Diagnosis not present

## 2017-11-25 NOTE — Progress Notes (Signed)
   THERAPIST PROGRESS NOTE  Session Time: 8381-840  Participation Level: Active  Behavioral Response: Well GroomedAlertNA  Type of Therapy: Individual Therapy  Treatment Goals addressed: Coping  Interventions: CBT  Summary: Leonard Johnston is a 52 y.o. male who presents with PTSD symptoms. Leonard Johnston reports that, since his medications were changed, he feels a lot less agitated. He states one medication has been making him have "erotic dreams about other women, and then I wake up feeling guilty and twisted." However, Leonard Johnston reports he is no longer having nightmares. 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 However, he was able to challenge negative thoughts, and calm himself down. He reports his medications are making him dizzy, and  I asked him to contact the nurse regarding medication questions. Leonard Johnston reports being anxious about his courtdate on Monday, so we discussed possible outcomes and how he would manage his anxiety.   Suicidal/Homicidal: No  Therapist Response: At Leonard Johnston's last session, I asked him to attempt completing the though record again--again [place] did not complete it. He stated, "I only had two incidents so I didn't do it." Leonard Johnston is resistant to completing worksheets outside of sessions, but seems open to utilizing CBT strategies such as challenging negative thoughts. We will continue to utilize CBT interventions.   Plan: Return again in 2 weeks.  Diagnosis: Axis I: Post Traumatic Stress Disorder    Axis II: No diagnosis    Leonard Hipp, LCSW 11/25/2017

## 2017-11-25 NOTE — Telephone Encounter (Signed)
pt is in with kelsey.  he states that he is having medication issues. that he is dizzy. pt spoke with lea.  lea came and told me. do u want to see patient ?

## 2017-11-25 NOTE — Telephone Encounter (Signed)
Please ask him to stop both propranolol and Prazosin for now and he can set up an appointment to be seen tomorrow .

## 2017-11-28 ENCOUNTER — Ambulatory Visit: Payer: Medicare HMO | Admitting: Psychiatry

## 2017-11-28 ENCOUNTER — Other Ambulatory Visit: Payer: Self-pay

## 2017-11-28 ENCOUNTER — Encounter: Payer: Self-pay | Admitting: Psychiatry

## 2017-11-28 VITALS — BP 97/62 | HR 80 | Temp 97.6°F | Wt 178.4 lb

## 2017-11-28 DIAGNOSIS — F410 Panic disorder [episodic paroxysmal anxiety] without agoraphobia: Secondary | ICD-10-CM | POA: Diagnosis not present

## 2017-11-28 DIAGNOSIS — F321 Major depressive disorder, single episode, moderate: Secondary | ICD-10-CM

## 2017-11-28 DIAGNOSIS — F4310 Post-traumatic stress disorder, unspecified: Secondary | ICD-10-CM | POA: Diagnosis not present

## 2017-11-28 DIAGNOSIS — F429 Obsessive-compulsive disorder, unspecified: Secondary | ICD-10-CM

## 2017-11-28 MED ORDER — SERTRALINE HCL 50 MG PO TABS: 50.0000 mg | ORAL_TABLET | Freq: Every day | ORAL | 1 refills | Status: DC

## 2017-11-28 NOTE — Patient Instructions (Addendum)
Melatonin oral solid dosage forms What is this medicine? MELATONIN (mel uh TOH nin) is a dietary supplement. It is mostly promoted to help maintain normal sleep patterns. The FDA has not approved this supplement for any medical use. This supplement may be used for other purposes; ask your health care provider or pharmacist if you have questions. This medicine may be used for other purposes; ask your health care provider or pharmacist if you have questions. COMMON BRAND NAME(S): Melatonex What should I tell my health care provider before I take this medicine? They need to know if you have any of these conditions: -cancer -depression or mental illness -diabetes -hormone problems -if you often drink alcohol -immune system problems -liver disease -lung or breathing disease, like asthma -organ transplant -seizure disorder -an unusual or allergic reaction to melatonin, other medicines, foods, dyes, or preservatives -pregnant or trying to get pregnant -breast-feeding How should I use this medicine? Take this supplement by mouth with a glass of water. Do not take with food. This supplement is usually taken 1 or 2 hours before bedtime. After taking this supplement, limit your activities to those needed to prepare for bed. Some products may be chewed or dissolved in the mouth before swallowing. Some tablets or capsules must be swallowed whole; do not cut, crush or chew. Follow the directions on the package labeling, or take as directed by your health care professional. Do not take this supplement more often than directed. Talk to your pediatrician regarding the use of this supplement in children. Special care may be needed. This supplement is not recommended for use in children without a prescription. Overdosage: If you think you have taken too much of this medicine contact a poison control center or emergency room at once. NOTE: This medicine is only for you. Do not share this medicine with  others. What if I miss a dose? If you miss taking your dose at the usual time, skip that dose. If it is almost time for your next dose, take only that dose. Do not take double or extra doses. What may interact with this medicine? Do not take this medicine with any of the following medications: -fluvoxamine -ramelteon -tasimelteon This medicine may also interact with the following medications: -alcohol -caffeine -carbamazepine -certain antibiotics like ciprofloxacin -certain medicines for depression, anxiety, or psychotic disturbances -cimetidine -male hormones, like estrogens and birth control pills, patches, rings, or injections -methoxsalen -nifedipine -other medications for sleep -other herbal or dietary supplements -phenobarbital -rifampin -smoking tobacco -tamoxifen -warfarin This list may not describe all possible interactions. Give your health care provider a list of all the medicines, herbs, non-prescription drugs, or dietary supplements you use. Also tell them if you smoke, drink alcohol, or use illegal drugs. Some items may interact with your medicine. What should I watch for while using this medicine? See your doctor if your symptoms do not get better or if they get worse. Do not take this supplement for more than 2 weeks unless your doctor tells you to. You may get drowsy or dizzy. Do not drive, use machinery, or do anything that needs mental alertness until you know how this medicine affects you. Do not stand or sit up quickly, especially if you are an older patient. This reduces the risk of dizzy or fainting spells. Alcohol may interfere with the effect of this medicine. Avoid alcoholic drinks. Talk to your doctor before you use this supplement if you are currently being treated for an emotional, mental, or sleep problem. This medicine  may interfere with your treatment. Herbal or dietary supplements are not regulated like medicines. Rigid quality control standards are  not required for dietary supplements. The purity and strength of these products can vary. The safety and effect of this dietary supplement for a certain disease or illness is not well known. This product is not intended to diagnose, treat, cure or prevent any disease. The Food and Drug Administration suggests the following to help consumers protect themselves: -Always read product labels and follow directions. -Natural does not mean a product is safe for humans to take. -Look for products that include USP after the ingredient name. This means that the manufacturer followed the standards of the [place] Pharmacopoeia. -Supplements made or sold by a nationally known food or drug company are more likely to be made under tight controls. You can write to the company for more information about how the product was made. What side effects may I notice from receiving this medicine? Side effects that you should report to your doctor or health care professional as soon as possible: -allergic reactions like skin rash, itching or hives, swelling of the face, lips, or tongue -breathing problems -confusion -depressed mood, irritable, or other changes in moods or behaviors -feeling faint or lightheaded, falls -increased blood pressure -irregular or missed menstrual periods -signs and symptoms of liver injury like dark yellow or brown urine; general ill feeling or flu-like symptoms; light-colored stools; loss of appetite; nausea; right upper belly pain; unusually weak or tired; yellowing of the eyes or skin -trouble staying awake or alert during the day -unusual activities while you are still asleep like driving, eating, making phone calls -unusual bleeding or bruising Side effects that usually do not require medical attention (report to your doctor or health care professional if they continue or are bothersome): -dizziness -drowsiness -headache -hot flashes -nausea -tiredness -unusual dreams or  nightmares -upset stomach This list may not describe all possible side effects. Call your doctor for medical advice about side effects. You may report side effects to FDA at 1-800-FDA-1088. Where should I keep my medicine? Keep out of the reach of children. Store at room temperature or as directed on the package label. Protect from moisture. Throw away any unused supplement after the expiration date. NOTE: This sheet is a summary. It may not cover all possible information. If you have questions about this medicine, talk to your doctor, pharmacist, or health care provider.  2018 Elsevier/Gold Standard (2015-11-07 14:38:22)   Melatonin - 3 to 5 mg at bedtime - 90 minutes prior to bedtime.

## 2017-11-28 NOTE — Progress Notes (Signed)
[facility] MD OP Progress Note  11/28/2017 4:29 PM Leonard Johnston  MRN:  [id number]  Chief Complaint: ' I am here for follow up." Chief Complaint    Follow-up; Medication Problem     HPI: Leonard Johnston is a 52 year old Caucasian male who is married, on disability, lives in [place], has a history of being legally blind, insulin-dependent diabetes mellitus, hyperlipidemia, PTSD, panic disorder, MDD, OCD, presented to the clinic today for a follow-up visit.  Patient presented alone today while his wife Leonard Johnston',  waited outside.  Patient today reports he has noticed some improvement in his mood symptoms.  He continues to take the Zoloft as prescribed.  He reports he started taking the prazosin as prescribed but started getting dizziness.  He reports he hence stopped taking it.  He continues to feel dizzy at times after he takes the Zoloft.  However he reports he is able to cope with it.  Patient reports some weird dreams at night.  Discussed with him that it could be related to his trauma and may not be a side effect of the medication.  Discussed with him to monitor his symptoms closely and let writer know. Pt also reports PTSD symptoms like severe anxiety symptoms triggered by the sound of sirens from fire truck, flashes of light and so on.  He reports he takes propranolol as needed when he feels that way and it helps to some extent.  He also has been doing deep breathing and relaxation techniques.  Patient continues to be in psychotherapy with Leonard Johnston our therapist which is going well.  Pt denies any suicidality.  Patient denies any perceptual disturbances.  Patient does have upcoming court hearing next Monday.  He does report some anxiety about the same.     Visit Diagnosis:    ICD-10-CM   1. PTSD (post-traumatic stress disorder) F43.10 sertraline (ZOLOFT) 50 MG tablet  2. Panic disorder F41.0   3. MDD (major depressive disorder), single episode, moderate (HCC) F32.1 sertraline (ZOLOFT) 50 MG tablet   4. Obsessive-compulsive disorder with good or fair insight F42.9     Past Psychiatric History: Reviewed past psychiatric history from my progress note on 10/29/2017.  Previous trials of Lamictal, Remeron and BuSpar  Past Medical History:  Past Medical History:  Diagnosis Date  . Anxiety   . Diabetes mellitus type I ([place])   . Diabetes mellitus without complication ([place])    type 1  . GERD (gastroesophageal reflux disease)   . Hypercholesteremia     Past Surgical History:  Procedure Laterality Date  . amputaion of toe    . COLONOSCOPY WITH PROPOFOL N/A 06/24/2017   Procedure: COLONOSCOPY WITH PROPOFOL;  Surgeon: Manya Silvas, MD;  Location: [facility] ENDOSCOPY;  Service: Endoscopy;  Laterality: N/A;  . ESOPHAGOGASTRODUODENOSCOPY (EGD) WITH PROPOFOL N/A 06/24/2017   Procedure: ESOPHAGOGASTRODUODENOSCOPY (EGD) WITH PROPOFOL;  Surgeon: Manya Silvas, MD;  Location: [facility] ENDOSCOPY;  Service: Endoscopy;  Laterality: N/A;  . EYE SURGERY    . TOE SURGERY      Family Psychiatric History: Reviewed family psychiatric history from my progress note on 10/29/2017  Family History:  Family History  Problem Relation Age of Onset  . Alcohol abuse Sister   . Drug abuse Sister   . Alcohol abuse Sister   . Drug abuse Sister     Social History: I have reviewed social history from my progress note on 10/29/2017 Social History   Socioeconomic History  . Marital status: Married    Spouse name: Leonard Johnston  .  Number of children: 0  . Years of education: Not on file  . Highest education level: High school graduate  Occupational History  . Not on file  Social Needs  . Financial resource strain: Somewhat hard  . Food insecurity:    Worry: Often true    Inability: Often true  . Transportation needs:    Medical: No    Non-medical: No  Tobacco Use  . Smoking status: Former Smoker    Last attempt to quit: 06/24/2009    Years since quitting: 8.4  . Smokeless tobacco: Never Used  Substance and Sexual  Activity  . Alcohol use: No  . Drug use: No  . Sexual activity: Yes  Lifestyle  . Physical activity:    Days per week: 0 days    Minutes per session: 0 min  . Stress: Very much  Relationships  . Social connections:    Talks on phone: Not on file    Gets together: Not on file    Attends religious service: Never    Active member of club or organization: No    Attends meetings of clubs or organizations: Never    Relationship status: Married  Other Topics Concern  . Not on file  Social History Narrative  . Not on file    Allergies: No Known Allergies  Metabolic Disorder Labs: No results found for: HGBA1C, MPG No results found for: PROLACTIN No results found for: CHOL, TRIG, HDL, CHOLHDL, VLDL, LDLCALC No results found for: TSH  Therapeutic Level Labs: No results found for: LITHIUM No results found for: VALPROATE No components found for:  CBMZ  Current Medications: Current Outpatient Medications  Medication Sig Dispense Refill  . atorvastatin (LIPITOR) 10 MG tablet Take by mouth.    . insulin aspart (NOVOLOG FLEXPEN) 100 UNIT/ML FlexPen Inject into the skin 3 (three) times daily with meals.    . insulin aspart (NOVOLOG) 100 UNIT/ML FlexPen Take up to 50 units daily in divided doses    . Insulin Glargine (BASAGLAR KWIKPEN) 100 UNIT/ML SOPN Inject into the skin.    . Multiple Vitamin (MULTI-VITAMINS) TABS Take by mouth.    Marland Kitchen omeprazole (PRILOSEC) 20 MG capsule Take 20 mg by mouth daily.    . propranolol (INDERAL) 10 MG tablet Take 1 tablet (10 mg total) by mouth 3 (three) times daily as needed. Only for severe agitation/anxiety 90 tablet 1  . ranitidine (ZANTAC) 75 MG tablet Take 75 mg by mouth 2 (two) times daily.    . sertraline (ZOLOFT) 50 MG tablet Take 1 tablet (50 mg total) by mouth daily. 30 tablet 1  . traZODone (DESYREL) 50 MG tablet Take 0.5-1 tablets (25-50 mg total) by mouth at bedtime as needed for sleep. 30 tablet 1   No current facility-administered  medications for this visit.      Musculoskeletal: Strength & Muscle Tone: within normal limits Gait & Station: normal Patient leans: N/A  Psychiatric Specialty Exam: Review of Systems  Psychiatric/Behavioral: The patient is nervous/anxious and has insomnia.   All other systems reviewed and are negative.   Blood pressure 97/62, pulse 80, temperature 97.6 F (36.4 C), temperature source Oral, weight 178 lb 6.4 oz (80.9 kg).Body mass index is 19.6 kg/m.  General Appearance: Casual  Eye Contact:  Fair  Speech:  Clear and Coherent  Volume:  Normal  Mood:  Anxious  Affect:  Congruent  Thought Process:  Goal Directed and Descriptions of Associations: Intact  Orientation:  Full (Time, Place, and Person)  Thought  Content: Logical   Suicidal Thoughts:  No  Homicidal Thoughts:  No  Memory:  Immediate;   Fair Recent;   Fair Remote;   Fair  Judgement:  Fair  Insight:  Fair  Psychomotor Activity:  Normal  Concentration:  Concentration: Fair and Attention Span: Fair  Recall:  [employer] of Knowledge: Fair  Language: Fair  Akathisia:  No  Handed:  Right  AIMS (if indicated): na  Assets:  Communication Skills Desire for Improvement Social Support  ADL's:  Intact  Cognition: WNL  Sleep:  restless   Screenings:   Assessment and Plan: Leonard Johnston is a 52 year old Caucasian male, married, on disability, legally blind, lives in [place], has a history of PTSD, panic disorder, MDD, OCD, insulin-dependent diabetes mellitus, hyperlipidemia, presented to the clinic today for a follow-up visit.  Patient is biologically predisposed given his history of trauma as well as family history of mental health problems.  Patient also has medical problems as well as legal issues.  Patient has been having some side effects of dizziness to the medications and hence discussed the following medication changes.  Plan as noted below.  Plan For PTSD Continue Zoloft 50 mg p.o. daily.  Will not increase the  dosage today since he has adverse effects of dizziness. Continue propranolol 10 mg p.o. 3 times daily as needed for severe anxiety symptoms.  He reports he uses it very rarely. He has used it just once since his last visit here. Discontinue prazosin for side effects.  He does struggle with dizziness and his blood pressure is also low today.  For MDD Zoloft 50 mg p.o. daily Continue psychotherapy.  For OCD Continue psychotherapy Zoloft 50 mg p.o. daily  For insomnia Continue trazodone 25-50 mg p.o. nightly as needed Discussed with patient to start melatonin 3 - 5 mg along with trazodone at bedtime.  Follow-up in clinic in 2 weeks or sooner if needed.  More than 50 % of the time was spent for psychoeducation and supportive psychotherapy and care coordination.  This note was generated in part or whole with voice recognition software. Voice recognition is usually quite accurate but there are transcription errors that can and very often do occur. I apologize for any typographical errors that were not detected and corrected.        Ursula Alert, MD 11/29/2017, 9:10 AM

## 2017-11-29 ENCOUNTER — Encounter: Payer: Self-pay | Admitting: Psychiatry

## 2017-12-05 ENCOUNTER — Other Ambulatory Visit: Payer: Self-pay | Admitting: Psychiatry

## 2017-12-05 ENCOUNTER — Telehealth: Payer: Self-pay

## 2017-12-05 DIAGNOSIS — F4310 Post-traumatic stress disorder, unspecified: Secondary | ICD-10-CM

## 2017-12-05 NOTE — Telephone Encounter (Signed)
Pt called states he does not have any more refills on his prazosin hcl . Pt was told that on 11-29-17 he stated that he was getting dizzy and stopped taking it.  Pt states that he is still taking it and that he needs a refill.   See attached copy of your office note from  11-29-17 below:     He reports he started taking the prazosin as prescribed but started getting dizziness.  He reports he hence stopped taking it.  He continues to feel dizzy at times after he takes the Zoloft.  However he reports he is able to cope with it.

## 2017-12-06 NOTE — Telephone Encounter (Signed)
Yes Prazosin was discontinued since he was dizzy and his BP  Was also very low. He is not on it anymore. Please let pt know . Thanks

## 2017-12-12 ENCOUNTER — Ambulatory Visit: Payer: Medicare HMO | Admitting: Psychiatry

## 2017-12-12 ENCOUNTER — Encounter: Payer: Self-pay | Admitting: Psychiatry

## 2017-12-12 VITALS — BP 160/90 | HR 86 | Ht >= 80 in | Wt 184.0 lb

## 2017-12-12 DIAGNOSIS — F410 Panic disorder [episodic paroxysmal anxiety] without agoraphobia: Secondary | ICD-10-CM | POA: Diagnosis not present

## 2017-12-12 DIAGNOSIS — F321 Major depressive disorder, single episode, moderate: Secondary | ICD-10-CM

## 2017-12-12 DIAGNOSIS — F4310 Post-traumatic stress disorder, unspecified: Secondary | ICD-10-CM

## 2017-12-12 DIAGNOSIS — F429 Obsessive-compulsive disorder, unspecified: Secondary | ICD-10-CM | POA: Diagnosis not present

## 2017-12-12 MED ORDER — PRAZOSIN HCL 5 MG PO CAPS: 5.0000 mg | ORAL_CAPSULE | Freq: Every day | ORAL | 1 refills | Status: DC
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
## 2017-12-13 ENCOUNTER — Encounter: Payer: Self-pay | Admitting: Psychiatry

## 2017-12-20 ENCOUNTER — Other Ambulatory Visit: Payer: Self-pay | Admitting: Psychiatry

## 2017-12-20 DIAGNOSIS — F4310 Post-traumatic stress disorder, unspecified: Secondary | ICD-10-CM

## 2017-12-20 DIAGNOSIS — F321 Major depressive disorder, single episode, moderate: Secondary | ICD-10-CM

## 2017-12-26 ENCOUNTER — Ambulatory Visit (INDEPENDENT_AMBULATORY_CARE_PROVIDER_SITE_OTHER): Payer: Medicare HMO | Admitting: Licensed Clinical Social Worker

## 2017-12-26 ENCOUNTER — Encounter: Payer: Self-pay | Admitting: Licensed Clinical Social Worker

## 2017-12-26 DIAGNOSIS — F4310 Post-traumatic stress disorder, unspecified: Secondary | ICD-10-CM
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
## 2018-01-09 ENCOUNTER — Ambulatory Visit: Payer: Medicare HMO | Admitting: Psychiatry

## 2018-01-13 ENCOUNTER — Ambulatory Visit: Payer: Medicare HMO | Admitting: Licensed Clinical Social Worker

## 2018-01-13 ENCOUNTER — Other Ambulatory Visit: Payer: Self-pay | Admitting: Psychiatry

## 2018-01-13 DIAGNOSIS — F4310 Post-traumatic stress disorder, unspecified: Secondary | ICD-10-CM

## 2018-01-13 DIAGNOSIS — F321 Major depressive disorder, single episode, moderate: Secondary | ICD-10-CM

## 2018-01-14 ENCOUNTER — Other Ambulatory Visit: Payer: Self-pay | Admitting: Psychiatry

## 2018-01-14 DIAGNOSIS — F321 Major depressive disorder, single episode, moderate: Secondary | ICD-10-CM

## 2018-01-14 DIAGNOSIS — F4310 Post-traumatic stress disorder, unspecified: Secondary | ICD-10-CM

## 2018-01-22 ENCOUNTER — Other Ambulatory Visit: Payer: Self-pay

## 2018-01-22 ENCOUNTER — Encounter: Payer: Self-pay | Admitting: Psychiatry

## 2018-01-22 ENCOUNTER — Ambulatory Visit (INDEPENDENT_AMBULATORY_CARE_PROVIDER_SITE_OTHER): Payer: Medicare HMO | Admitting: Psychiatry

## 2018-01-22 VITALS — BP 117/72 | HR 81 | Temp 97.5°F | Wt 179.8 lb

## 2018-01-22 DIAGNOSIS — F4310 Post-traumatic stress disorder, unspecified: Secondary | ICD-10-CM

## 2018-01-22 DIAGNOSIS — F321 Major depressive disorder, single episode, moderate: Secondary | ICD-10-CM

## 2018-01-22 DIAGNOSIS — F429 Obsessive-compulsive disorder, unspecified: Secondary | ICD-10-CM | POA: Diagnosis not present

## 2018-01-22 DIAGNOSIS — F410 Panic disorder [episodic paroxysmal anxiety] without agoraphobia: Secondary | ICD-10-CM

## 2018-01-22 MED ORDER — PRAZOSIN HCL 5 MG PO CAPS: 5.0000 mg | ORAL_CAPSULE | Freq: Every day | ORAL | 1 refills | Status: DC

## 2018-01-22 MED ORDER — TRAZODONE HCL 50 MG PO TABS
ORAL_TABLET | ORAL | 1 refills | Status: DC
Start: 2018-01-22 — End: 2019-10-18

## 2018-01-22 MED ORDER — PROPRANOLOL HCL 10 MG PO TABS: 10.0000 mg | ORAL_TABLET | Freq: Three times a day (TID) | ORAL | 0 refills | Status: DC | PRN

## 2018-01-22 MED ORDER — SERTRALINE HCL 50 MG PO TABS: 75.0000 mg | ORAL_TABLET | Freq: Every day | ORAL | 0 refills | Status: DC

## 2018-01-22 NOTE — Progress Notes (Signed)
[facility] MD OP Progress Note  01/22/2018 4:20 PM Leonard Johnston  MRN:  [id number]  Chief Complaint: ' I am here for follow up." Chief Complaint    Follow-up; Medication Refill     HPI: Leonard Johnston is a 51 year old Caucasian male, married, on disability, lives in [place], has a history of being legally blind, insulin-dependent diabetes mellitus, hyperlipidemia, PTSD, panic disorder, MDD, OCD, presented to the clinic today for a follow-up visit.  Patient reports that his medication has been helping with his anxiety symptoms.  He reports he feels better than before however there are times when he continues to feel fidgety, restless and agitated.  He reports he continues to avoid crowded places.  He continues to be hypervigilant when he is in public places.  He reports his sleep is improved on the current medication of trazodone and prazosin.  He reports when he does not take the prazosin he notices his nightmares is coming back.  He reports his dizziness has improved and he is tolerating medications well now.  Patient denies any suicidality.  Patient denies any perceptual disturbances.  Patient reports he was not complete has been few days however he is currently back on  He continued to avoid illicit substances and alcohol.  Continues to have good social support system from his wants to continue psychotherapy sessions which are helpful.  Patient reports he looks forward to thanks giving holidays and plans to spend it with family.  Visit Diagnosis:    ICD-10-CM   1. PTSD (post-traumatic stress disorder) F43.10 sertraline (ZOLOFT) 50 MG tablet    propranolol (INDERAL) 10 MG tablet    traZODone (DESYREL) 50 MG tablet  2. Panic disorder F41.0 propranolol (INDERAL) 10 MG tablet  3. MDD (major depressive disorder), single episode, moderate (HCC) F32.1 sertraline (ZOLOFT) 50 MG tablet    traZODone (DESYREL) 50 MG tablet  4. Obsessive-compulsive disorder with good or fair insight F42.9     Past  Psychiatric History: I have reviewed past psychiatric history from my progress note on 10/29/2017.  Previous trials of Lamictal, mirtazapine, BuSpar  Past Medical History:  Past Medical History:  Diagnosis Date  . Anxiety   . Diabetes mellitus type I ([place])   . Diabetes mellitus without complication ([place])    type 1  . GERD (gastroesophageal reflux disease)   . Hypercholesteremia     Past Surgical History:  Procedure Laterality Date  . amputaion of toe    . COLONOSCOPY WITH PROPOFOL N/A 06/24/2017   Procedure: COLONOSCOPY WITH PROPOFOL;  Surgeon: Manya Silvas, MD;  Location: [facility] ENDOSCOPY;  Service: Endoscopy;  Laterality: N/A;  . ESOPHAGOGASTRODUODENOSCOPY (EGD) WITH PROPOFOL N/A 06/24/2017   Procedure: ESOPHAGOGASTRODUODENOSCOPY (EGD) WITH PROPOFOL;  Surgeon: Manya Silvas, MD;  Location: [facility] ENDOSCOPY;  Service: Endoscopy;  Laterality: N/A;  . EYE SURGERY    . TOE SURGERY      Family Psychiatric History: Reviewed family psychiatric history from my progress note on 10/29/2017  Family History:  Family History  Problem Relation Age of Onset  . Alcohol abuse Sister   . Drug abuse Sister   . Alcohol abuse Sister   . Drug abuse Sister     Social History: Reviewed social history from my progress note on 10/29/2017 Social History   Socioeconomic History  . Marital status: Married    Spouse name: jullie  . Number of children: 0  . Years of education: Not on file  . Highest education level: High school graduate  Occupational History  .  Not on file  Social Needs  . Financial resource strain: Somewhat hard  . Food insecurity:    Worry: Often true    Inability: Often true  . Transportation needs:    Medical: No    Non-medical: No  Tobacco Use  . Smoking status: Former Smoker    Last attempt to quit: 06/24/2009    Years since quitting: 8.5  . Smokeless tobacco: Never Used  Substance and Sexual Activity  . Alcohol use: No  . Drug use: No  . Sexual activity: Yes   Lifestyle  . Physical activity:    Days per week: 0 days    Minutes per session: 0 min  . Stress: Very much  Relationships  . Social connections:    Talks on phone: Not on file    Gets together: Not on file    Attends religious service: Never    Active member of club or organization: No    Attends meetings of clubs or organizations: Never    Relationship status: Married  Other Topics Concern  . Not on file  Social History Narrative  . Not on file    Allergies: No Known Allergies  Metabolic Disorder Labs: No results found for: HGBA1C, MPG No results found for: PROLACTIN No results found for: CHOL, TRIG, HDL, CHOLHDL, VLDL, LDLCALC No results found for: TSH  Therapeutic Level Labs: No results found for: LITHIUM No results found for: VALPROATE No components found for:  CBMZ  Current Medications: Current Outpatient Medications  Medication Sig Dispense Refill  . atorvastatin (LIPITOR) 10 MG tablet Take by mouth.    . Continuous Blood Gluc Receiver (DEXCOM G6 RECEIVER) DEVI Use as directed. [place] [phone]    . Continuous Blood Gluc Sensor (DEXCOM G6 SENSOR) MISC Use 1 Device every 10 (ten) days NDC [phone]. Run through Medicare partB    . Continuous Blood Gluc Transmit (DEXCOM G6 TRANSMITTER) MISC Use as directed, every 3 months. [place] [phone]. Run through Medicare partB    . glucose blood (ONE TOUCH ULTRA TEST) test strip Use to test blood sugars 7 times daily    . insulin aspart (NOVOLOG FLEXPEN) 100 UNIT/ML FlexPen Inject into the skin 3 (three) times daily with meals.    . insulin aspart (NOVOLOG) 100 UNIT/ML FlexPen Take up to 50 units daily in divided doses    . Insulin Glargine (BASAGLAR KWIKPEN) 100 UNIT/ML SOPN Inject into the skin.    . Multiple Vitamin (MULTI-VITAMINS) TABS Take by mouth.    Marland Kitchen omeprazole (PRILOSEC) 20 MG capsule Take 20 mg by mouth daily.    . prazosin (MINIPRESS) 5 MG capsule Take 1 capsule (5 mg total) by mouth at bedtime. For  nightmares 90 capsule 1  . propranolol (INDERAL) 10 MG tablet Take 1 tablet (10 mg total) by mouth 3 (three) times daily as needed. Only for severe agitation/anxiety 270 tablet 0  . ranitidine (ZANTAC) 75 MG tablet Take 75 mg by mouth 2 (two) times daily.    . sertraline (ZOLOFT) 50 MG tablet Take 1.5 tablets (75 mg total) by mouth daily. 135 tablet 0  . traZODone (DESYREL) 50 MG tablet TAKE 1/2 TO 1 (ONE-HALF TO ONE) TABLET BY MOUTH AT BEDTIME AS NEEDED FOR SLEEP 90 tablet 1  . BD PEN NEEDLE MICRO U/F 32G X 6 MM MISC 4 (four) times daily. use as directed  3  . ONE TOUCH ULTRA TEST test strip USE TO TEST BLOOD SUGARS 7 TIMES DAILY  11   No current facility-administered  medications for this visit.      Musculoskeletal: Strength & Muscle Tone: within normal limits Gait & Station: normal Patient leans: N/A  Psychiatric Specialty Exam: Review of Systems  Psychiatric/Behavioral: The patient is nervous/anxious.   All other systems reviewed and are negative.   Blood pressure 117/72, pulse 81, temperature (!) 97.5 F (36.4 C), temperature source Oral, weight 179 lb 12.8 oz (81.6 kg).Body mass index is 19.75 kg/m.  General Appearance: Casual  Eye Contact:  Fair  Speech:  Clear and Coherent  Volume:  Normal  Mood:  Anxious  Affect:  Appropriate  Thought Process:  Goal Directed and Descriptions of Associations: Intact  Orientation:  Full (Time, Place, and Person)  Thought Content: Logical   Suicidal Thoughts:  No  Homicidal Thoughts:  No  Memory:  Immediate;   Fair Recent;   Fair Remote;   Fair  Judgement:  Fair  Insight:  Fair  Psychomotor Activity:  Normal  Concentration:  Concentration: Fair and Attention Span: Fair  Recall:  [employer] of Knowledge: Fair  Language: Fair  Akathisia:  No  Handed:  Right  AIMS (if indicated): denies tremors, rigidity,stiffness  Assets:  Communication Skills Desire for Improvement Social Support  ADL's:  Intact  Cognition: WNL  Sleep:  Fair    Screenings:   Assessment and Plan: Harrie is a 52 year old Caucasian male, married, on disability, legally blind, lives in [place], has a history of PTSD, panic disorder, MDD, OCD, insulin-dependent diabetes mellitus, hyperlipidemia, presented to the clinic today for a follow-up visit.  Patient is biologically predisposed given his history of trauma as well as family history of mental health problems.  Patient reports medications as helpful but continues to struggle with anxiety symptoms .  Hence we will continue to make medication changes as noted below  Plan For PTSD Increase Zoloft to 75 mg p.o. daily Propranolol 10 mg p.o. 3 times daily as needed Prazosin 5 mg po qhs for nightmares.  MDD Zoloft increased to 75 mg p.o. daily  For OCD Continue Zoloft. Continue psychotherapy sessions.  For insomnia Continue trazodone 25-50 mg p.o. nightly as needed and melatonin 3-5 mg.  Pt to continue psychotherapy sessions with Ms. [name].  Follow-up in clinic in 1 month or sooner if needed.  More than 50 % of the time was spent for psychoeducation and supportive psychotherapy and care coordination.  This note was generated in part or whole with voice recognition software. Voice recognition is usually quite accurate but there are transcription errors that can and very often do occur. I apologize for any typographical errors that were not detected and corrected.        Ursula Alert, MD 01/22/2018, 4:20 PM

## 2018-01-26 ENCOUNTER — Emergency Department
Admission: EM | Admit: 2018-01-26 | Discharge: 2018-01-26 | Disposition: A | Discharge status: Home / Self Care | Payer: Medicare HMO | Attending: Emergency Medicine | Admitting: Emergency Medicine

## 2018-01-26 ENCOUNTER — Encounter: Payer: Self-pay | Admitting: Emergency Medicine

## 2018-01-26 ENCOUNTER — Other Ambulatory Visit: Payer: Self-pay

## 2018-01-26 DIAGNOSIS — Z79899 Other long term (current) drug therapy: Secondary | ICD-10-CM | POA: Insufficient documentation

## 2018-01-26 DIAGNOSIS — Z87891 Personal history of nicotine dependence: Secondary | ICD-10-CM | POA: Diagnosis not present

## 2018-01-26 DIAGNOSIS — J111 Influenza due to unidentified influenza virus with other respiratory manifestations: Secondary | ICD-10-CM | POA: Diagnosis not present

## 2018-01-26 DIAGNOSIS — I10 Essential (primary) hypertension: Secondary | ICD-10-CM | POA: Diagnosis not present

## 2018-01-26 DIAGNOSIS — E103593 Type 1 diabetes mellitus with proliferative diabetic retinopathy without macular edema, bilateral: Secondary | ICD-10-CM | POA: Insufficient documentation

## 2018-01-26 DIAGNOSIS — R070 Pain in throat: Secondary | ICD-10-CM | POA: Diagnosis present

## 2018-01-26 DIAGNOSIS — M791 Myalgia, unspecified site: Secondary | ICD-10-CM | POA: Diagnosis not present

## 2018-01-26 LAB — INFLUENZA PANEL BY PCR (TYPE A & B)
Influenza A By PCR: POSITIVE — AB
Influenza B By PCR: NEGATIVE

## 2018-01-26 LAB — GLUCOSE, CAPILLARY: GLUCOSE-CAPILLARY: 285 mg/dL — AB (ref 70–99)

## 2018-01-26 MED ORDER — OSELTAMIVIR PHOSPHATE 75 MG PO CAPS: 75.0000 mg | ORAL_CAPSULE | Freq: Two times a day (BID) | ORAL | 0 refills | Status: AC

## 2018-01-26 NOTE — ED Triage Notes (Signed)
C/o body aches, sore throat, cough, headache, watery eyes and nausea.  Sx are X 2 days.  Nephew had flu and pt reports he was not in same room but was at same house.  No vomiting.  Has not checked temperature.  Ambulatory. VSS.

## 2018-01-26 NOTE — Discharge Instructions (Signed)
Your are being treated empirically for influenza, due to exposure and symptoms. Take the Tamiflu as directed for symptoms. Continue to monitor and treat any fevers with Tylenol and ibuprofen. Rest, hydrate, and avoid public exposure until symptoms and medicine are completed. Follow-up with your provider for ongoing symptoms.

## 2018-01-26 NOTE — ED Provider Notes (Signed)
[facility] Emergency Department Provider Note ____________________________________________  Time seen: 1517  I have reviewed the triage vital signs and the nursing notes.  HISTORY  Chief Complaint  influenza symptoms  HPI Leonard Johnston is a 52 y.o. male who presents to the ED accompanied by his wife, for evaluation of flulike symptoms.  The patient received the seasonal flu vaccine in October, reports a 2-day complaint of body aches, sore throat, cough, headache, watery eyes, and nausea.  He also notes that his nephew had confirmed flu after he had Thanksgiving dinner together.  Patient denies any shortness of breath, chest pain, or abdominal pain.  Past Medical History:  Diagnosis Date  . Anxiety   . Diabetes mellitus type I ([place])   . Diabetes mellitus without complication ([place])    type 1  . GERD (gastroesophageal reflux disease)   . Hypercholesteremia     Patient Active Problem List   Diagnosis Date Noted  . AKI (acute kidney injury) ([place]) 09/11/2015  . Anxiety 09/11/2015  . Essential hypertension 09/11/2015  . Pure hypercholesterolemia 09/11/2015  . 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 Proliferative diabetic retinopathy, both eyes ([place]) 06/19/2012  . Retinopathy, diabetic, proliferative ([place]) 06/04/2012  . Acute osteomyelitis of ankle or foot ([place]) 12/16/2011  . Cellulitis of foot 12/12/2011    Past Surgical History:  Procedure Laterality Date  . amputaion of toe    . COLONOSCOPY WITH PROPOFOL N/A 06/24/2017   Procedure: COLONOSCOPY WITH PROPOFOL;  Surgeon: Manya Silvas, MD;  Location: [facility] ENDOSCOPY;  Service: Endoscopy;  Laterality: N/A;  . ESOPHAGOGASTRODUODENOSCOPY (EGD) WITH PROPOFOL N/A 06/24/2017   Procedure: ESOPHAGOGASTRODUODENOSCOPY (EGD) WITH PROPOFOL;  Surgeon: Manya Silvas, MD;  Location: [facility] ENDOSCOPY;  Service: Endoscopy;  Laterality: N/A;  . EYE SURGERY    . TOE SURGERY       Prior to Admission medications   Medication Sig Start Date End Date Taking? Authorizing Provider  atorvastatin (LIPITOR) 10 MG tablet Take by mouth. 08/12/17 08/12/18  [provider]  BD PEN NEEDLE MICRO U/F 32G X 6 MM MISC 4 (four) times daily. use as directed 12/16/17   [provider]  Continuous Blood Gluc Receiver (DEXCOM G6 RECEIVER) DEVI Use as directed. [facility] [id number] 12/26/17   [provider]  Continuous Blood Gluc Sensor (DEXCOM G6 SENSOR) MISC Use 1 Device every 10 (ten) days NDC [phone]. Run through Medicare partB 01/01/18   [provider]  Continuous Blood Gluc Transmit (DEXCOM G6 TRANSMITTER) MISC Use as directed, every 3 months. [place] [phone]. Run through Medicare partB 01/01/18   [provider]  glucose blood (ONE TOUCH ULTRA TEST) test strip Use to test blood sugars 7 times daily 01/16/18   [provider]  insulin aspart (NOVOLOG FLEXPEN) 100 UNIT/ML FlexPen Inject into the skin 3 (three) times daily with meals.    [provider]  insulin aspart (NOVOLOG) 100 UNIT/ML FlexPen Take up to 50 units daily in divided doses 11/18/17   [provider]  Insulin Glargine (BASAGLAR KWIKPEN) 100 UNIT/ML SOPN Inject into the skin. 09/26/15   [provider]  Multiple Vitamin (MULTI-VITAMINS) TABS Take by mouth.    [provider]  omeprazole (PRILOSEC) 20 MG capsule Take 20 mg by mouth daily.    [provider]  ONE TOUCH ULTRA TEST test strip USE TO TEST BLOOD SUGARS 7 TIMES DAILY 01/17/18   [provider]  oseltamivir (TAMIFLU) 75 MG capsule Take 1  capsule (75 mg total) by mouth 2 (two) times daily for 5 days. 01/26/18 01/31/18  Francisca Langenderfer, Dannielle Karvonen, PA-C  prazosin (MINIPRESS) 5 MG capsule Take 1 capsule (5 mg total) by mouth at bedtime. For nightmares 01/22/18   Ursula Alert, MD  propranolol (INDERAL) 10 MG tablet Take 1 tablet (10 mg total) by mouth 3 (three)  times daily as needed. Only for severe agitation/anxiety 01/22/18   Ursula Alert, MD  ranitidine (ZANTAC) 75 MG tablet Take 75 mg by mouth 2 (two) times daily.    [provider]  sertraline (ZOLOFT) 50 MG tablet Take 1.5 tablets (75 mg total) by mouth daily. 01/22/18   Ursula Alert, MD  traZODone (DESYREL) 50 MG tablet TAKE 1/2 TO 1 (ONE-HALF TO ONE) TABLET BY MOUTH AT BEDTIME AS NEEDED FOR SLEEP 01/22/18   Ursula Alert, MD    Allergies Patient has no known allergies.  Family History  Problem Relation Age of Onset  . Alcohol abuse Sister   . Drug abuse Sister   . Alcohol abuse Sister   . Drug abuse Sister     Social History Social History   Tobacco Use  . Smoking status: Former Smoker    Last attempt to quit: 06/24/2009    Years since quitting: 8.5  . Smokeless tobacco: Never Used  Substance Use Topics  . Alcohol use: No  . Drug use: No    Review of Systems  Constitutional: Negative for fever. Eyes: Negative for visual changes. Reports watery eye ENT: Negative for sore throat. Cardiovascular: Negative for chest pain. Respiratory: Negative for shortness of breath. Reports cough Gastrointestinal: Negative for abdominal pain, vomiting and diarrhea. Reports nausea Genitourinary: Negative for dysuria. Musculoskeletal: Negative for back pain. Reports generalized bodyaches Skin: Negative for rash. Neurological: Negative for headaches, focal weakness or numbness. ____________________________________________  PHYSICAL EXAM:  VITAL SIGNS: ED Triage Vitals  Enc Vitals Group     BP 01/26/18 1340 113/69     Pulse Rate 01/26/18 1340 60     Resp 01/26/18 1340 (!) 96     Temp 01/26/18 1340 98.3 F (36.8 C)     Temp Source 01/26/18 1340 Oral     SpO2 01/26/18 1340 96 %     Weight 01/26/18 1340 183 lb (83 kg)     Height 01/26/18 1340 6\' 9"  (2.057 m)     Head Circumference --      Peak Flow --      Pain Score 01/26/18 1345 9     Pain Loc --      Pain Edu?  --      Excl. in [place]? --     Constitutional: Alert and oriented. Well appearing and in no distress. Head: Normocephalic and atraumatic. Eyes: Conjunctivae are normal. PERRL. Normal extraocular movements Ears: Canals clear. TMs intact bilaterally. Nose: No congestion/rhinorrhea/epistaxis. Mouth/Throat: Mucous membranes are moist. Uvula is midline and tonsils are flat. Neck: Supple. No thyromegaly. Hematological/Lymphatic/Immunological: No cervical lymphadenopathy. Cardiovascular: Normal rate, regular rhythm. Normal distal pulses. Respiratory: Normal respiratory effort. No wheezes/rales/rhonchi. Musculoskeletal: Nontender with normal range of motion in all extremities.  Neurologic:  Normal gait without ataxia. Normal speech and language. No gross focal neurologic deficits are appreciated. ____________________________________________   LABS (pertinent positives/negatives)  Labs Reviewed  GLUCOSE, CAPILLARY - Abnormal; Notable for the following components:      Result Value   Glucose-Capillary 285 (*)    All other components within normal limits  INFLUENZA PANEL BY PCR (TYPE A & B) - Abnormal;  Notable for the following components:   Influenza A By PCR POSITIVE (*)    All other components within normal limits  ____________________________________________  PROCEDURES  Procedures ____________________________________________  INITIAL IMPRESSION / ASSESSMENT AND PLAN / ED COURSE  Patient with ED evaluation of flu-like symptoms. He has had flu-positive contacts including his nephew and currently his wife. He will be discharged with a prescription for Tamiflu, empirically for his exposure. He will discharge prior to lab confirmation.  ____________________________________________  FINAL CLINICAL IMPRESSION(S) / ED DIAGNOSES  Final diagnoses:  Influenza      Melvenia Needles, PA-C 01/26/18 1655    Arta Silence, MD 01/26/18 2043

## 2018-02-03 ENCOUNTER — Ambulatory Visit: Payer: Medicare HMO | Admitting: Licensed Clinical Social Worker

## 2018-02-17 ENCOUNTER — Ambulatory Visit: Payer: Medicare HMO | Admitting: Psychiatry

## 2018-02-17 NOTE — Progress Notes (Deleted)
[facility] MD OP Progress Note  02/17/2018 2:33 PM Leonard Johnston  MRN:  [id number]  Chief Complaint:  HPI: Leonard Johnston is a 52 year old Caucasian male, married, on disability, lives in [place], has a history of being legally blind, insulin-dependent diabetes mellitus, hyperlipidemia, PTSD, panic disorder, MDD, OCD, presented to the clinic today for a follow-up visit.     Visit Diagnosis: No diagnosis found.  Past Psychiatric History: Reviewed past psychiatric history from my progress note on 10/29/2017.  Previous trials of Lamictal, mirtazapine, BuSpar.   Past Medical History:  Past Medical History:  Diagnosis Date  . Anxiety   . Diabetes mellitus type I ([place])   . Diabetes mellitus without complication ([place])    type 1  . GERD (gastroesophageal reflux disease)   . Hypercholesteremia     Past Surgical History:  Procedure Laterality Date  . amputaion of toe    . COLONOSCOPY WITH PROPOFOL N/A 06/24/2017   Procedure: COLONOSCOPY WITH PROPOFOL;  Surgeon: Manya Silvas, MD;  Location: [facility] ENDOSCOPY;  Service: Endoscopy;  Laterality: N/A;  . ESOPHAGOGASTRODUODENOSCOPY (EGD) WITH PROPOFOL N/A 06/24/2017   Procedure: ESOPHAGOGASTRODUODENOSCOPY (EGD) WITH PROPOFOL;  Surgeon: Manya Silvas, MD;  Location: [facility] ENDOSCOPY;  Service: Endoscopy;  Laterality: N/A;  . EYE SURGERY    . TOE SURGERY      Family Psychiatric History: Reviewed family psychiatric history from my progress note on 10/29/2017  Family History:  Family History  Problem Relation Age of Onset  . Alcohol abuse Sister   . Drug abuse Sister   . Alcohol abuse Sister   . Drug abuse Sister     Social History: Reviewed social history from my progress note on 10/29/2017. Social History   Socioeconomic History  . Marital status: Married    Spouse name: jullie  . Number of children: 0  . Years of education: Not on file  . Highest education level: High school graduate  Occupational History  . Not on file  Social Needs  .  Financial resource strain: Somewhat hard  . Food insecurity:    Worry: Often true    Inability: Often true  . Transportation needs:    Medical: No    Non-medical: No  Tobacco Use  . Smoking status: Former Smoker    Last attempt to quit: 06/24/2009    Years since quitting: 8.6  . Smokeless tobacco: Never Used  Substance and Sexual Activity  . Alcohol use: No  . Drug use: No  . Sexual activity: Yes  Lifestyle  . Physical activity:    Days per week: 0 days    Minutes per session: 0 min  . Stress: Very much  Relationships  . Social connections:    Talks on phone: Not on file    Gets together: Not on file    Attends religious service: Never    Active member of club or organization: No    Attends meetings of clubs or organizations: Never    Relationship status: Married  Other Topics Concern  . Not on file  Social History Narrative  . Not on file    Allergies: No Known Allergies  Metabolic Disorder Labs: No results found for: HGBA1C, MPG No results found for: PROLACTIN No results found for: CHOL, TRIG, HDL, CHOLHDL, VLDL, LDLCALC No results found for: TSH  Therapeutic Level Labs: No results found for: LITHIUM No results found for: VALPROATE No components found for:  CBMZ  Current Medications: Current Outpatient Medications  Medication Sig Dispense Refill  .  atorvastatin (LIPITOR) 10 MG tablet Take by mouth.    . BD PEN NEEDLE MICRO U/F 32G X 6 MM MISC 4 (four) times daily. use as directed  3  . Continuous Blood Gluc Receiver (DEXCOM G6 RECEIVER) DEVI Use as directed. [place] [phone]    . Continuous Blood Gluc Sensor (DEXCOM G6 SENSOR) MISC Use 1 Device every 10 (ten) days NDC [phone]. Run through Medicare partB    . Continuous Blood Gluc Transmit (DEXCOM G6 TRANSMITTER) MISC Use as directed, every 3 months. [place] [phone]. Run through Medicare partB    . glucose blood (ONE TOUCH ULTRA TEST) test strip Use to test blood sugars 7 times daily    . insulin  aspart (NOVOLOG FLEXPEN) 100 UNIT/ML FlexPen Inject into the skin 3 (three) times daily with meals.    . insulin aspart (NOVOLOG) 100 UNIT/ML FlexPen Take up to 50 units daily in divided doses    . Insulin Glargine (BASAGLAR KWIKPEN) 100 UNIT/ML SOPN Inject into the skin.    . Multiple Vitamin (MULTI-VITAMINS) TABS Take by mouth.    Marland Kitchen omeprazole (PRILOSEC) 20 MG capsule Take 20 mg by mouth daily.    . ONE TOUCH ULTRA TEST test strip USE TO TEST BLOOD SUGARS 7 TIMES DAILY  11  . prazosin (MINIPRESS) 5 MG capsule Take 1 capsule (5 mg total) by mouth at bedtime. For nightmares 90 capsule 1  . propranolol (INDERAL) 10 MG tablet Take 1 tablet (10 mg total) by mouth 3 (three) times daily as needed. Only for severe agitation/anxiety 270 tablet 0  . ranitidine (ZANTAC) 75 MG tablet Take 75 mg by mouth 2 (two) times daily.    . sertraline (ZOLOFT) 50 MG tablet Take 1.5 tablets (75 mg total) by mouth daily. 135 tablet 0  . traZODone (DESYREL) 50 MG tablet TAKE 1/2 TO 1 (ONE-HALF TO ONE) TABLET BY MOUTH AT BEDTIME AS NEEDED FOR SLEEP 90 tablet 1   No current facility-administered medications for this visit.      Musculoskeletal: Strength & Muscle Tone: {desc; muscle tone:32375} Gait & Station: {PE GAIT ED WPYK:99833} Patient leans: {Patient Leans:21022755}  Psychiatric Specialty Exam: ROS  There were no vitals taken for this visit.There is no height or weight on file to calculate BMI.  General Appearance: {Appearance:22683}  Eye Contact:  {BHH EYE CONTACT:22684}  Speech:  {Speech:22685}  Volume:  {Volume (PAA):22686}  Mood:  {BHH MOOD:22306}  Affect:  {Affect (PAA):22687}  Thought Process:  {Thought Process (PAA):22688}  Orientation:  {BHH ORIENTATION (PAA):22689}  Thought Content: {Thought Content:22690}   Suicidal Thoughts:  {ST/HT (PAA):22692}  Homicidal Thoughts:  {ST/HT (PAA):22692}  Memory:  {BHH MEMORY:22881}  Judgement:  {Judgement (PAA):22694}  Insight:  {Insight (PAA):22695}   Psychomotor Activity:  {Psychomotor (PAA):22696}  Concentration:  {Concentration:21399}  Recall:  {BHH GOOD/FAIR/POOR:22877}  Fund of Knowledge: {BHH GOOD/FAIR/POOR:22877}  Language: {BHH GOOD/FAIR/POOR:22877}  Akathisia:  {BHH YES OR NO:22294}  Handed:  {Handed:22697}  AIMS (if indicated): {Desc; done/not:10129}  Assets:  {Assets (PAA):22698}  ADL's:  {BHH ASN'K:53976}  Cognition: {chl bhh cognition:304700322}  Sleep:  {BHH GOOD/FAIR/POOR:22877}   Screenings:   Assessment and Plan: Aijalon is a 52 year old Caucasian male, married, on disability, legally blind, lives in [place], has a history of PTSD, panic disorder, MDD, OCD, insulin-dependent diabetes mellitus, hyperlipidemia, presented to the clinic today for a follow-up visit.  Patient is biologically predisposed given his history of trauma, family history of mental health problems.       Ursula Alert, MD 02/17/2018, 2:33 PM

## 2019-02-06 DIAGNOSIS — F419 Anxiety disorder, unspecified: Secondary | ICD-10-CM | POA: Insufficient documentation

## 2019-02-06 DIAGNOSIS — E1065 Type 1 diabetes mellitus with hyperglycemia: Secondary | ICD-10-CM | POA: Insufficient documentation

## 2019-02-06 DIAGNOSIS — IMO0002 Reserved for concepts with insufficient information to code with codable children: Secondary | ICD-10-CM | POA: Insufficient documentation

## 2019-02-06 DIAGNOSIS — E1042 Type 1 diabetes mellitus with diabetic polyneuropathy: Secondary | ICD-10-CM | POA: Insufficient documentation

## 2019-10-18 ENCOUNTER — Ambulatory Visit
Admission: EM | Admit: 2019-10-18 | Discharge: 2019-10-18 | Disposition: A | Discharge status: Home / Self Care | Payer: Medicare HMO | Attending: Family Medicine | Admitting: Family Medicine

## 2019-10-18 ENCOUNTER — Encounter: Payer: Self-pay | Admitting: Emergency Medicine

## 2019-10-18 ENCOUNTER — Other Ambulatory Visit: Payer: Self-pay

## 2019-10-18 DIAGNOSIS — B349 Viral infection, unspecified: Secondary | ICD-10-CM

## 2019-10-18 DIAGNOSIS — Z20822 Contact with and (suspected) exposure to covid-19: Secondary | ICD-10-CM | POA: Diagnosis not present

## 2019-10-18 MED ORDER — ALBUTEROL SULFATE HFA 108 (90 BASE) MCG/ACT IN AERS: 1.0000 | INHALATION_SPRAY | Freq: Four times a day (QID) | RESPIRATORY_TRACT | 0 refills | Status: DC | PRN

## 2019-10-18 NOTE — Discharge Instructions (Signed)
Rest.  Medication as prescribed.  Results available tomorrow.  Take care  Dr. Lacinda Axon

## 2019-10-18 NOTE — ED Triage Notes (Signed)
Pt c/o shortness of breath, loss of taste, cough, dizziness, fever(102), fatigue and diarrhea. Denies body aches and chills. Started 6 days ago. He states he has type 1 diabetes. He has been covid vaccinated.

## 2019-10-19 LAB — SARS CORONAVIRUS 2 (TAT 6-24 HRS): SARS Coronavirus 2: NEGATIVE

## 2019-10-19 NOTE — ED Provider Notes (Signed)
[facility]    CSN: [id number] Arrival date & time: 10/18/19  1449  History   Chief Complaint Chief Complaint  Patient presents with  . Shortness of Breath   HPI  54 year old male presents with multiple complaints.  Patient reports he has been sick for the past 6 days.  He reports shortness of breath, loss of taste, cough, dizziness, fever T-max 102, fatigue, diarrhea.  Denies body aches.  Denies chills.  He has had his Covid vaccination.  He has been in contact with multiple individuals.  He is unsure of his had any direct exposure to COVID-19.  No relieving factors.  He was most troubled by the shortness of breath.  No other complaints at this time.  Past Medical History:  Diagnosis Date  . Anxiety   . Diabetes mellitus type I ([place])   . Diabetes mellitus without complication ([place])    type 1  . GERD (gastroesophageal reflux disease)   . Hypercholesteremia     Patient Active Problem List   Diagnosis Date Noted  . AKI (acute kidney injury) ([place]) 09/11/2015  . Anxiety 09/11/2015  . Essential hypertension 09/11/2015  . Pure hypercholesterolemia 09/11/2015  . 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 Proliferative diabetic retinopathy, both eyes ([place]) 06/19/2012  . Retinopathy, diabetic, proliferative ([place]) 06/04/2012  . Acute osteomyelitis of ankle or foot ([place]) 12/16/2011  . Cellulitis of foot 12/12/2011    Past Surgical History:  Procedure Laterality Date  . amputaion of toe    . COLONOSCOPY WITH PROPOFOL N/A 06/24/2017   Procedure: COLONOSCOPY WITH PROPOFOL;  Surgeon: Manya Silvas, MD;  Location: [facility] ENDOSCOPY;  Service: Endoscopy;  Laterality: N/A;  . ESOPHAGOGASTRODUODENOSCOPY (EGD) WITH PROPOFOL N/A 06/24/2017   Procedure: ESOPHAGOGASTRODUODENOSCOPY (EGD) WITH PROPOFOL;  Surgeon: Manya Silvas, MD;  Location: [facility] ENDOSCOPY;  Service: Endoscopy;  Laterality: N/A;  . EYE SURGERY    . TOE SURGERY          Home Medications    Prior to Admission medications   Medication Sig Start Date End Date Taking? Authorizing Provider  insulin aspart (NOVOLOG) 100 UNIT/ML FlexPen Take up to 50 units daily in divided doses 11/18/17  Yes [provider]  Insulin Glargine (BASAGLAR KWIKPEN) 100 UNIT/ML SOPN Inject into the skin. 09/26/15  Yes [provider]  Multiple Vitamin (MULTI-VITAMINS) TABS Take by mouth.   Yes [provider]  omeprazole (PRILOSEC) 20 MG capsule Take 20 mg by mouth daily.   Yes [provider]  albuterol (VENTOLIN HFA) 108 (90 Base) MCG/ACT inhaler Inhale 1-2 puffs into the lungs every 6 (six) hours as needed for wheezing or shortness of breath. 10/18/19   Thersa Salt G, DO  atorvastatin (LIPITOR) 10 MG tablet Take by mouth. 08/12/17 08/12/18  [provider]  BD PEN NEEDLE MICRO U/F 32G X 6 MM MISC 4 (four) times daily. use as directed 12/16/17   [provider]  Continuous Blood Gluc Receiver (DEXCOM G6 RECEIVER) DEVI Use as directed. [facility] [id number] 12/26/17   [provider]  Continuous Blood Gluc Sensor (DEXCOM G6 SENSOR) MISC Use 1 Device every 10 (ten) days NDC [phone]. Run through Medicare partB 01/01/18   [provider]  Continuous Blood Gluc Transmit (DEXCOM G6 TRANSMITTER) MISC Use as directed, every 3 months. [place] [phone]. Run through Medicare partB 01/01/18   [provider]  glucose blood (ONE TOUCH ULTRA TEST) test strip Use to test blood sugars 7 times daily 01/16/18  [provider]  insulin aspart (NOVOLOG FLEXPEN) 100 UNIT/ML FlexPen Inject into the skin 3 (three) times daily with meals.    [provider]  ONE TOUCH ULTRA TEST test strip USE TO TEST BLOOD SUGARS 7 TIMES DAILY 01/17/18   [provider]  prazosin (MINIPRESS) 5 MG capsule Take 1 capsule (5 mg total) by mouth at bedtime. For nightmares 01/22/18 10/18/19  Ursula Alert, MD   propranolol (INDERAL) 10 MG tablet Take 1 tablet (10 mg total) by mouth 3 (three) times daily as needed. Only for severe agitation/anxiety 01/22/18 10/18/19  Ursula Alert, MD  ranitidine (ZANTAC) 75 MG tablet Take 75 mg by mouth 2 (two) times daily.  10/18/19  [provider]  sertraline (ZOLOFT) 50 MG tablet Take 1.5 tablets (75 mg total) by mouth daily. 01/22/18 10/18/19  Ursula Alert, MD  traZODone (DESYREL) 50 MG tablet TAKE 1/2 TO 1 (ONE-HALF TO ONE) TABLET BY MOUTH AT BEDTIME AS NEEDED FOR SLEEP 01/22/18 10/18/19  Ursula Alert, MD    Family History Family History  Problem Relation Age of Onset  . Alcohol abuse Sister   . Drug abuse Sister   . Alcohol abuse Sister   . Drug abuse Sister     Social History Social History   Tobacco Use  . Smoking status: Former Smoker    Quit date: 06/24/2009    Years since quitting: 10.3  . Smokeless tobacco: Never Used  Vaping Use  . Vaping Use: Never used  Substance Use Topics  . Alcohol use: No  . Drug use: No     Allergies   Patient has no known allergies.   Review of Systems Review of Systems Per HPI  Physical Exam Triage Vital Signs ED Triage Vitals  Enc Vitals Group     BP 10/18/19 1537 (!) 144/89     Pulse Rate 10/18/19 1537 84     Resp 10/18/19 1537 18     Temp 10/18/19 1537 98.5 F (36.9 C)     Temp Source 10/18/19 1537 Oral     SpO2 10/18/19 1537 98 %     Weight 10/18/19 1532 182 lb 15.7 oz (83 kg)     Height 10/18/19 1532 6\' 9"  (2.057 m)     Head Circumference --      Peak Flow --      Pain Score 10/18/19 1531 7     Pain Loc --      Pain Edu? --      Excl. in [place]? --    No data found.  Updated Vital Signs BP (!) 144/89 (BP Location: Right Arm)   Pulse 84   Temp 98.5 F (36.9 C) (Oral)   Resp 18   Ht 6\' 9"  (2.057 m)   Wt 83 kg   SpO2 98%   BMI 19.61 kg/m   Visual Acuity Right Eye Distance:   Left Eye Distance:   Bilateral Distance:    Right Eye Near:   Left Eye Near:     Bilateral Near:     Physical Exam Constitutional:      General: He is not in acute distress.    Appearance: He is well-developed. He is not ill-appearing.  HENT:     Head: Normocephalic and atraumatic.  Eyes:     General:        Right eye: No discharge.        Left eye: No discharge.     Conjunctiva/sclera: Conjunctivae normal.  Cardiovascular:  Rate and Rhythm: Normal rate and regular rhythm.     Heart sounds: No murmur heard.   Pulmonary:     Effort: Pulmonary effort is normal.     Breath sounds: Normal breath sounds. No wheezing or rales.  Neurological:     Mental Status: He is alert.  Psychiatric:        Mood and Affect: Mood normal.        Behavior: Behavior normal.    UC Treatments / Results  Labs (all labs ordered are listed, but only abnormal results are displayed) Labs Reviewed  SARS CORONAVIRUS 2 (TAT 6-24 HRS)    EKG   Radiology No results found.  Procedures Procedures (including critical care time)  Medications Ordered in UC Medications - No data to display  Initial Impression / Assessment and Plan / UC Course  I have reviewed the triage vital signs and the nursing notes.  Pertinent labs & imaging results that were available during my care of the patient were reviewed by me and considered in my medical decision making (see chart for details).    54 year old male presents with viral illness.  Concern for COVID-19.  Albuterol as needed.  Supportive care.  Awaiting Covid test result.  Final Clinical Impressions(s) / UC Diagnoses   Final diagnoses:  Viral illness  Suspected COVID-19 virus infection     Discharge Instructions     Rest.  Medication as prescribed.  Results available tomorrow.  Take care  Dr. Lacinda Axon    ED Prescriptions    Medication Sig Dispense Auth. Provider   albuterol (VENTOLIN HFA) 108 (90 Base) MCG/ACT inhaler Inhale 1-2 puffs into the lungs every 6 (six) hours as needed for wheezing or shortness of breath. 18  g Coral Spikes, DO     PDMP not reviewed this encounter.   Coral Spikes, [place] 10/19/19 1305

## 2020-03-12 ENCOUNTER — Emergency Department: Payer: Medicare HMO

## 2020-03-12 ENCOUNTER — Encounter: Payer: Self-pay | Admitting: Internal Medicine

## 2020-03-12 ENCOUNTER — Inpatient Hospital Stay
Admission: EM | Admit: 2020-03-12 | Discharge: 2020-03-15 | DRG: 177 | Disposition: A | Discharge status: Home / Self Care | Payer: Medicare HMO | Attending: Internal Medicine | Admitting: Internal Medicine

## 2020-03-12 ENCOUNTER — Other Ambulatory Visit: Payer: Self-pay

## 2020-03-12 DIAGNOSIS — F419 Anxiety disorder, unspecified: Secondary | ICD-10-CM | POA: Diagnosis present

## 2020-03-12 DIAGNOSIS — U071 COVID-19: Principal | ICD-10-CM | POA: Diagnosis present

## 2020-03-12 DIAGNOSIS — Z87891 Personal history of nicotine dependence: Secondary | ICD-10-CM | POA: Diagnosis not present

## 2020-03-12 DIAGNOSIS — Z79899 Other long term (current) drug therapy: Secondary | ICD-10-CM | POA: Diagnosis not present

## 2020-03-12 DIAGNOSIS — K219 Gastro-esophageal reflux disease without esophagitis: Secondary | ICD-10-CM | POA: Diagnosis present

## 2020-03-12 DIAGNOSIS — E1110 Type 2 diabetes mellitus with ketoacidosis without coma: Secondary | ICD-10-CM | POA: Diagnosis present

## 2020-03-12 DIAGNOSIS — E1022 Type 1 diabetes mellitus with diabetic chronic kidney disease: Secondary | ICD-10-CM | POA: Diagnosis present

## 2020-03-12 DIAGNOSIS — E860 Dehydration: Secondary | ICD-10-CM | POA: Diagnosis present

## 2020-03-12 DIAGNOSIS — IMO0002 Reserved for concepts with insufficient information to code with codable children: Secondary | ICD-10-CM | POA: Diagnosis present

## 2020-03-12 DIAGNOSIS — Z794 Long term (current) use of insulin: Secondary | ICD-10-CM | POA: Diagnosis not present

## 2020-03-12 DIAGNOSIS — E109 Type 1 diabetes mellitus without complications: Secondary | ICD-10-CM | POA: Diagnosis present

## 2020-03-12 DIAGNOSIS — E7800 Pure hypercholesterolemia, unspecified: Secondary | ICD-10-CM | POA: Diagnosis present

## 2020-03-12 DIAGNOSIS — E1039 Type 1 diabetes mellitus with other diabetic ophthalmic complication: Secondary | ICD-10-CM | POA: Diagnosis present

## 2020-03-12 DIAGNOSIS — Z813 Family history of other psychoactive substance abuse and dependence: Secondary | ICD-10-CM | POA: Diagnosis not present

## 2020-03-12 DIAGNOSIS — R112 Nausea with vomiting, unspecified: Secondary | ICD-10-CM | POA: Diagnosis not present

## 2020-03-12 DIAGNOSIS — A0839 Other viral enteritis: Secondary | ICD-10-CM | POA: Diagnosis present

## 2020-03-12 DIAGNOSIS — E785 Hyperlipidemia, unspecified: Secondary | ICD-10-CM | POA: Diagnosis present

## 2020-03-12 DIAGNOSIS — R197 Diarrhea, unspecified: Secondary | ICD-10-CM

## 2020-03-12 DIAGNOSIS — N1831 Chronic kidney disease, stage 3a: Secondary | ICD-10-CM | POA: Diagnosis present

## 2020-03-12 DIAGNOSIS — Z811 Family history of alcohol abuse and dependence: Secondary | ICD-10-CM

## 2020-03-12 DIAGNOSIS — E1065 Type 1 diabetes mellitus with hyperglycemia: Secondary | ICD-10-CM

## 2020-03-12 DIAGNOSIS — N179 Acute kidney failure, unspecified: Secondary | ICD-10-CM | POA: Diagnosis present

## 2020-03-12 DIAGNOSIS — I10 Essential (primary) hypertension: Secondary | ICD-10-CM | POA: Diagnosis present

## 2020-03-12 DIAGNOSIS — E1010 Type 1 diabetes mellitus with ketoacidosis without coma: Secondary | ICD-10-CM | POA: Diagnosis present

## 2020-03-12 DIAGNOSIS — E11319 Type 2 diabetes mellitus with unspecified diabetic retinopathy without macular edema: Secondary | ICD-10-CM | POA: Diagnosis present

## 2020-03-12 DIAGNOSIS — I129 Hypertensive chronic kidney disease with stage 1 through stage 4 chronic kidney disease, or unspecified chronic kidney disease: Secondary | ICD-10-CM | POA: Diagnosis present

## 2020-03-12 DIAGNOSIS — R531 Weakness: Secondary | ICD-10-CM | POA: Diagnosis present

## 2020-03-12 LAB — BLOOD GAS, VENOUS
Acid-base deficit: 10.4 mmol/L — ABNORMAL HIGH (ref 0.0–2.0)
Bicarbonate: 16.3 mmol/L — ABNORMAL LOW (ref 20.0–28.0)
O2 Saturation: 33.3 %
Patient temperature: 37
pCO2, Ven: 38 mmHg — ABNORMAL LOW (ref 44.0–60.0)
pH, Ven: 7.24 — ABNORMAL LOW (ref 7.250–7.430)
pO2, Ven: <31 mmHg — CL (ref 32.0–45.0)

## 2020-03-12 LAB — CBG MONITORING, ED
Glucose-Capillary: 410 mg/dL — ABNORMAL HIGH (ref 70–99)
Glucose-Capillary: 336 mg/dL — ABNORMAL HIGH (ref 70–99)
Glucose-Capillary: 239 mg/dL — ABNORMAL HIGH (ref 70–99)
Glucose-Capillary: 217 mg/dL — ABNORMAL HIGH (ref 70–99)
Glucose-Capillary: 199 mg/dL — ABNORMAL HIGH (ref 70–99)
Glucose-Capillary: 160 mg/dL — ABNORMAL HIGH (ref 70–99)
Glucose-Capillary: 93 mg/dL (ref 70–99)
Glucose-Capillary: 139 mg/dL — ABNORMAL HIGH (ref 70–99)
Glucose-Capillary: 153 mg/dL — ABNORMAL HIGH (ref 70–99)

## 2020-03-12 LAB — URINALYSIS, COMPLETE (UACMP) WITH MICROSCOPIC
Bacteria, UA: NONE SEEN
Bilirubin Urine: NEGATIVE
Glucose, UA: >=500 mg/dL — AB
Ketones, ur: 80 mg/dL — AB
Leukocytes,Ua: NEGATIVE
Nitrite: NEGATIVE
Protein, ur: NEGATIVE mg/dL
Specific Gravity, Urine: 1.023 (ref 1.005–1.030)
pH: 5 (ref 5.0–8.0)

## 2020-03-12 LAB — CBC
HCT: 47.6 % (ref 39.0–52.0)
Hemoglobin: 15.4 g/dL (ref 13.0–17.0)
MCH: 30.7 pg (ref 26.0–34.0)
MCHC: 32.4 g/dL (ref 30.0–36.0)
MCV: 94.8 fL (ref 80.0–100.0)
Platelets: 140 K/uL — ABNORMAL LOW (ref 150–400)
RBC: 5.02 MIL/uL (ref 4.22–5.81)
RDW: 13.2 % (ref 11.5–15.5)
WBC: 11.3 K/uL — ABNORMAL HIGH (ref 4.0–10.5)
nRBC: 0 % (ref 0.0–0.2)

## 2020-03-12 LAB — BASIC METABOLIC PANEL WITH GFR
Anion gap: 12 (ref 5–15)
BUN: 37 mg/dL — ABNORMAL HIGH (ref 6–20)
CO2: 23 mmol/L (ref 22–32)
Calcium: 8.4 mg/dL — ABNORMAL LOW (ref 8.9–10.3)
Chloride: 99 mmol/L (ref 98–111)
Creatinine, Ser: 1.27 mg/dL — ABNORMAL HIGH (ref 0.61–1.24)
GFR, Estimated: >60 mL/min
Glucose, Bld: 228 mg/dL — ABNORMAL HIGH (ref 70–99)
Potassium: 4.2 mmol/L (ref 3.5–5.1)
Sodium: 134 mmol/L — ABNORMAL LOW (ref 135–145)

## 2020-03-12 LAB — COMPREHENSIVE METABOLIC PANEL
ALT: 19 U/L (ref 0–44)
AST: 23 U/L (ref 15–41)
Albumin: 3.9 g/dL (ref 3.5–5.0)
Alkaline Phosphatase: 78 U/L (ref 38–126)
Anion gap: 27 — ABNORMAL HIGH (ref 5–15)
BUN: 41 mg/dL — ABNORMAL HIGH (ref 6–20)
CO2: 14 mmol/L — ABNORMAL LOW (ref 22–32)
Calcium: 9 mg/dL (ref 8.9–10.3)
Chloride: 93 mmol/L — ABNORMAL LOW (ref 98–111)
Creatinine, Ser: 1.85 mg/dL — ABNORMAL HIGH (ref 0.61–1.24)
GFR, Estimated: 43 mL/min — ABNORMAL LOW (ref >60)
Glucose, Bld: 501 mg/dL (ref 70–99)
Potassium: 4.9 mmol/L (ref 3.5–5.1)
Sodium: 134 mmol/L — ABNORMAL LOW (ref 135–145)
Total Bilirubin: 2.4 mg/dL — ABNORMAL HIGH (ref 0.3–1.2)
Total Protein: 7.5 g/dL (ref 6.5–8.1)

## 2020-03-12 LAB — BASIC METABOLIC PANEL
Anion gap: 22 — ABNORMAL HIGH (ref 5–15)
BUN: 41 mg/dL — ABNORMAL HIGH (ref 6–20)
CO2: 17 mmol/L — ABNORMAL LOW (ref 22–32)
Calcium: 8.9 mg/dL (ref 8.9–10.3)
Chloride: 94 mmol/L — ABNORMAL LOW (ref 98–111)
Creatinine, Ser: 1.83 mg/dL — ABNORMAL HIGH (ref 0.61–1.24)
GFR, Estimated: 44 mL/min — ABNORMAL LOW (ref >60)
Glucose, Bld: 418 mg/dL — ABNORMAL HIGH (ref 70–99)
Potassium: 4.9 mmol/L (ref 3.5–5.1)
Sodium: 133 mmol/L — ABNORMAL LOW (ref 135–145)

## 2020-03-12 LAB — TRIGLYCERIDES: Triglycerides: 235 mg/dL — ABNORMAL HIGH (ref <150)

## 2020-03-12 LAB — FERRITIN: Ferritin: 297 ng/mL (ref 24–336)

## 2020-03-12 LAB — LACTATE DEHYDROGENASE: LDH: 110 U/L (ref 98–192)

## 2020-03-12 LAB — HIV ANTIBODY (ROUTINE TESTING W REFLEX): HIV Screen 4th Generation wRfx: NONREACTIVE

## 2020-03-12 LAB — FIBRINOGEN: Fibrinogen: 446 mg/dL (ref 210–475)

## 2020-03-12 LAB — RESP PANEL BY RT-PCR (FLU A&B, COVID) ARPGX2
Influenza A by PCR: NEGATIVE
Influenza B by PCR: NEGATIVE
SARS Coronavirus 2 by RT PCR: POSITIVE — AB

## 2020-03-12 LAB — BETA-HYDROXYBUTYRIC ACID: Beta-Hydroxybutyric Acid: 6.77 mmol/L — ABNORMAL HIGH (ref 0.05–0.27)

## 2020-03-12 LAB — PROCALCITONIN: Procalcitonin: 0.36 ng/mL

## 2020-03-12 LAB — GLUCOSE, CAPILLARY: Glucose-Capillary: 162 mg/dL — ABNORMAL HIGH (ref 70–99)

## 2020-03-12 LAB — FIBRIN DERIVATIVES D-DIMER (ARMC ONLY): Fibrin derivatives D-dimer (ARMC): 428.27 ng/mL (FEU) (ref 0.00–499.00)

## 2020-03-12 LAB — HEPATITIS B SURFACE ANTIGEN: Hepatitis B Surface Ag: NONREACTIVE

## 2020-03-12 LAB — TROPONIN I (HIGH SENSITIVITY)
Troponin I (High Sensitivity): 10 ng/L
Troponin I (High Sensitivity): 10 ng/L
Troponin I (High Sensitivity): 10 ng/L (ref <18)

## 2020-03-12 LAB — BRAIN NATRIURETIC PEPTIDE: B Natriuretic Peptide: 80 pg/mL (ref 0.0–100.0)

## 2020-03-12 LAB — C-REACTIVE PROTEIN: CRP: 5.6 mg/dL — ABNORMAL HIGH (ref <1.0)

## 2020-03-12 MED ORDER — INSULIN ASPART 100 UNIT/ML ~~LOC~~ SOLN
0.0000 [IU] | Freq: Every day | SUBCUTANEOUS | Status: DC
  Administered 2020-03-13: 23:00:00 2 [IU] via SUBCUTANEOUS
  Filled 2020-03-12: qty 1

## 2020-03-12 MED ORDER — PANTOPRAZOLE SODIUM 40 MG PO TBEC
40.0000 mg | DELAYED_RELEASE_TABLET | Freq: Every day | ORAL | Status: DC
  Administered 2020-03-12 – 2020-03-15 (×4): 40 mg via ORAL
  Filled 2020-03-12 (×4): qty 1

## 2020-03-12 MED ORDER — LACTATED RINGERS IV SOLN: INTRAVENOUS | Status: DC

## 2020-03-12 MED ORDER — LACTATED RINGERS IV BOLUS
1000.0000 mL | Freq: Once | INTRAVENOUS | Status: AC
  Administered 2020-03-12: 1000 mL via INTRAVENOUS

## 2020-03-12 MED ORDER — ONDANSETRON HCL 4 MG/2ML IJ SOLN
4.0000 mg | Freq: Three times a day (TID) | INTRAMUSCULAR | Status: DC | PRN
  Administered 2020-03-13 – 2020-03-14 (×3): 4 mg via INTRAVENOUS
  Filled 2020-03-12 (×3): qty 2

## 2020-03-12 MED ORDER — DM-GUAIFENESIN ER 30-600 MG PO TB12
1.0000 | ORAL_TABLET | Freq: Two times a day (BID) | ORAL | Status: DC | PRN
  Administered 2020-03-12 – 2020-03-14 (×2): 1 via ORAL
  Filled 2020-03-12 (×2): qty 1

## 2020-03-12 MED ORDER — LOPERAMIDE HCL 2 MG PO CAPS: 2.0000 mg | ORAL_CAPSULE | Freq: Two times a day (BID) | ORAL | Status: DC | PRN

## 2020-03-12 MED ORDER — INSULIN DETEMIR 100 UNIT/ML ~~LOC~~ SOLN
12.0000 [IU] | Freq: Every day | SUBCUTANEOUS | Status: DC
  Administered 2020-03-12: 12 [IU] via SUBCUTANEOUS
  Filled 2020-03-12 (×2): qty 0.12

## 2020-03-12 MED ORDER — DEXTROSE IN LACTATED RINGERS 5 % IV SOLN: INTRAVENOUS | Status: DC

## 2020-03-12 MED ORDER — INSULIN REGULAR(HUMAN) IN NACL 100-0.9 UT/100ML-% IV SOLN
INTRAVENOUS | Status: DC
  Administered 2020-03-12: 4.5 [IU]/h via INTRAVENOUS
  Administered 2020-03-12: 8.5 [IU]/h via INTRAVENOUS
  Filled 2020-03-12: qty 100

## 2020-03-12 MED ORDER — POTASSIUM CHLORIDE 10 MEQ/100ML IV SOLN
10.0000 meq | INTRAVENOUS | Status: AC
  Administered 2020-03-12 (×2): 10 meq via INTRAVENOUS
  Filled 2020-03-12 (×2): qty 100

## 2020-03-12 MED ORDER — IBUPROFEN 400 MG PO TABS
600.0000 mg | ORAL_TABLET | Freq: Once | ORAL | Status: AC
  Administered 2020-03-12: 600 mg via ORAL
  Filled 2020-03-12: qty 2

## 2020-03-12 MED ORDER — INSULIN ASPART 100 UNIT/ML ~~LOC~~ SOLN
0.0000 [IU] | Freq: Three times a day (TID) | SUBCUTANEOUS | Status: DC
  Administered 2020-03-12: 2 [IU] via SUBCUTANEOUS
  Administered 2020-03-13: 09:00:00 9 [IU] via SUBCUTANEOUS
  Administered 2020-03-13: 7 [IU] via SUBCUTANEOUS
  Administered 2020-03-13: 17:00:00 1 [IU] via SUBCUTANEOUS
  Administered 2020-03-14: 18:00:00 2 [IU] via SUBCUTANEOUS
  Filled 2020-03-12 (×5): qty 1

## 2020-03-12 MED ORDER — SODIUM CHLORIDE 0.9 % IV SOLN
200.0000 mg | Freq: Once | INTRAVENOUS | Status: AC
  Administered 2020-03-12: 200 mg via INTRAVENOUS
  Filled 2020-03-12: qty 200

## 2020-03-12 MED ORDER — ADULT MULTIVITAMIN W/MINERALS CH
1.0000 | ORAL_TABLET | Freq: Every day | ORAL | Status: DC
  Administered 2020-03-12 – 2020-03-15 (×4): 1 via ORAL
  Filled 2020-03-12 (×4): qty 1

## 2020-03-12 MED ORDER — ENOXAPARIN SODIUM 40 MG/0.4ML ~~LOC~~ SOLN
40.0000 mg | SUBCUTANEOUS | Status: DC
  Administered 2020-03-12 – 2020-03-14 (×3): 40 mg via SUBCUTANEOUS
  Filled 2020-03-12 (×3): qty 0.4

## 2020-03-12 MED ORDER — ACETAMINOPHEN 325 MG PO TABS
650.0000 mg | ORAL_TABLET | Freq: Four times a day (QID) | ORAL | Status: DC | PRN
  Administered 2020-03-12 – 2020-03-15 (×4): 650 mg via ORAL
  Filled 2020-03-12 (×4): qty 2

## 2020-03-12 MED ORDER — SODIUM CHLORIDE 0.9 % IV SOLN
100.0000 mg | Freq: Every day | INTRAVENOUS | Status: AC
  Administered 2020-03-13 – 2020-03-14 (×2): 100 mg via INTRAVENOUS
  Filled 2020-03-12 (×2): qty 20

## 2020-03-12 MED ORDER — DEXTROSE 50 % IV SOLN: 0.0000 mL | INTRAVENOUS | Status: DC | PRN

## 2020-03-12 MED ORDER — LACTATED RINGERS IV BOLUS
20.0000 mL/kg | Freq: Once | INTRAVENOUS | Status: AC
  Administered 2020-03-12: 1678 mL via INTRAVENOUS

## 2020-03-12 MED ORDER — ZINC SULFATE 220 (50 ZN) MG PO CAPS
220.0000 mg | ORAL_CAPSULE | Freq: Every day | ORAL | Status: DC
  Administered 2020-03-12 – 2020-03-15 (×4): 220 mg via ORAL
  Filled 2020-03-12 (×4): qty 1

## 2020-03-12 MED ORDER — ALBUTEROL SULFATE HFA 108 (90 BASE) MCG/ACT IN AERS
2.0000 | INHALATION_SPRAY | RESPIRATORY_TRACT | Status: DC | PRN
  Filled 2020-03-12: qty 6.7

## 2020-03-12 MED ORDER — HYDRALAZINE HCL 20 MG/ML IJ SOLN: 5.0000 mg | INTRAMUSCULAR | Status: DC | PRN

## 2020-03-12 MED ORDER — ASCORBIC ACID 500 MG PO TABS
500.0000 mg | ORAL_TABLET | Freq: Every day | ORAL | Status: DC
  Administered 2020-03-12 – 2020-03-15 (×4): 500 mg via ORAL
  Filled 2020-03-12 (×4): qty 1

## 2020-03-12 NOTE — ED Notes (Signed)
Pt now requesting something to help him sleep. Denies normally taking something to assist with sleep. Pt states hasn't slept much in last 3 days. Provider Mora Bellman notified.

## 2020-03-12 NOTE — ED Notes (Signed)
Messaged floor nurse Malka to give report.

## 2020-03-12 NOTE — ED Notes (Signed)
Pt provided lunch tray.

## 2020-03-12 NOTE — ED Notes (Signed)
Unable to find a glucometer to check CBG at this time. Will continue to check.

## 2020-03-12 NOTE — ED Notes (Signed)
Christina at [phone] confirms ok to go ahead and send pt to floor.

## 2020-03-12 NOTE — ED Notes (Signed)
Critical glucose of 501 and anion gap of 27 called from lab. Secure chat sent to dr. Cheri Fowler to notify, pt to go to room 1.

## 2020-03-12 NOTE — ED Triage Notes (Signed)
Pt states has been ill for 5 days. Pt states he is weak, shob, has vomiting, diarrhea. Pt states "I can't take it anymore".

## 2020-03-12 NOTE — ED Notes (Signed)
Remains unable to find glucometer. Will continue to look in department.

## 2020-03-12 NOTE — H&P (Signed)
History and Physical    Leonard Johnston DZH:[id number] DOB: 1965-08-09 DOA: 03/12/2020  Referring MD/NP/PA:   PCP: Derinda Late, MD   Patient coming from:  The patient is coming from home.  At baseline, pt is independent for most of ADL.        Chief Complaint: Shortness of breath, nausea, vomiting, diarrhea, generalized weakness  HPI: Leonard Johnston is a 55 y.o. male with medical history significant of hypertension, hyperlipidemia, type 1 diabetes, GERD, anxiety, CKD stage III, who presents with shortness of breath, nausea, vomiting, diarrhea, generalized weakness.  Patient is stating he has been sick for more than 5 days.  He has shortness of breath, cough, nausea, vomiting, diarrhea and generalized weakness.  Patient has poor appetite and decreased oral intake.  No chest pain or abdominal pain.  No symptoms of UTI or unilateral weakness.  No fever or chills.  ED Course: pt was found to have positive COVID PCR, DKA (AG 27, bicarbonate 14, blood sugar 501, positive Ketone in urinalysis, beta hydroxybutyric acid 6.77, VBG showed pH of 7.24), worsening renal function with creatinine 1.83, BUN 41 (recent baseline creatinine 1.4 on 12/14/2019), WBC 11.3, troponin level 10, 10, temperature normal, blood pressure 124/67, heart rate 86, RR 16, oxygen saturation 95% on room air. Chest x-ray negative for effusion.  Patient is admitted to [facility] status inpatient   Review of Systems:   General: no fevers, chills, no body weight gain, has poor appetite, has fatigue HEENT: no blurry vision, hearing changes or sore throat Respiratory: has dyspnea, coughing, no wheezing CV: no chest pain, no palpitations GI: has nausea, vomiting, diarrhea, no constipation, abdominal pain,  GU: no dysuria, burning on urination, increased urinary frequency, hematuria  Ext: no leg edema Neuro: no unilateral weakness, numbness, or tingling, no vision change or hearing loss Skin: no rash, no skin tear. MSK: No muscle  spasm, no deformity, no limitation of range of movement in spin Heme: No easy bruising.  Travel history: No recent long distant travel.  Allergy: No Known Allergies  Past Medical History:  Diagnosis Date  . Anxiety   . Diabetes mellitus type I ([place])   . Diabetes mellitus without complication ([place])    type 1  . GERD (gastroesophageal reflux disease)   . Hypercholesteremia     Past Surgical History:  Procedure Laterality Date  . amputaion of toe    . COLONOSCOPY WITH PROPOFOL N/A 06/24/2017   Procedure: COLONOSCOPY WITH PROPOFOL;  Surgeon: Manya Silvas, MD;  [facility] ENDOSCOPY;  Service: Endoscopy;  Laterality: N/A;  . ESOPHAGOGASTRODUODENOSCOPY (EGD) WITH PROPOFOL N/A 06/24/2017   Procedure: ESOPHAGOGASTRODUODENOSCOPY (EGD) WITH PROPOFOL;  Surgeon: Manya Silvas, MD;  Location: [facility] ENDOSCOPY;  Service: Endoscopy;  Laterality: N/A;  . EYE SURGERY    . TOE SURGERY      Social History:  reports that he quit smoking about 10 years ago. He has never used smokeless tobacco. He reports that he does not drink alcohol and does not use drugs.  Family History:  Family History  Problem Relation Age of Onset  . Alcohol abuse Sister   . Drug abuse Sister   . Alcohol abuse Sister   . Drug abuse Sister      Prior to Admission medications   Medication Sig Start Date End Date Taking? Authorizing Provider  BD PEN NEEDLE MICRO U/F 32G X 6 MM MISC 4 (four) times daily. use as directed 12/16/17  Yes [provider]  Continuous Blood Gluc  Receiver (DEXCOM G6 RECEIVER) DEVI Use as directed. [facility] [id number] 12/26/17  Yes [provider]  Continuous Blood Gluc Sensor (DEXCOM G6 SENSOR) MISC Use 1 Device every 10 (ten) days N[facility] [phone]. Run through Medicare partB 01/01/18  Yes [provider]  Continuous Blood Gluc Transmit (DEXCOM G6 TRANSMITTER) MISC Use as directed, every 3 months. [place] [phone]. Run through Medicare partB 01/01/18  Yes [provider]  insulin aspart (NOVOLOG) 100 UNIT/ML FlexPen Inject into the skin 3 (three) times daily with meals. Per sliding scale on smart phone.   Yes [provider]  insulin detemir (LEVEMIR) 100 UNIT/ML injection Inject 16 Units into the skin at bedtime.   Yes [provider]  Multiple Vitamin (MULTI-VITAMINS) TABS Take by mouth.   Yes [provider]  omeprazole (PRILOSEC) 20 MG capsule Take 20 mg by mouth daily.   Yes [provider]  prazosin (MINIPRESS) 5 MG capsule Take 1 capsule (5 mg total) by mouth at bedtime. For nightmares 01/22/18 10/18/19  Ursula Alert, MD  propranolol (INDERAL) 10 MG tablet Take 1 tablet (10 mg total) by mouth 3 (three) times daily as needed. Only for severe agitation/anxiety 01/22/18 10/18/19  Ursula Alert, MD  ranitidine (ZANTAC) 75 MG tablet Take 75 mg by mouth 2 (two) times daily.  10/18/19  [provider]  sertraline (ZOLOFT) 50 MG tablet Take 1.5 tablets (75 mg total) by mouth daily. 01/22/18 10/18/19  Ursula Alert, MD  traZODone (DESYREL) 50 MG tablet TAKE 1/2 TO 1 (ONE-HALF TO ONE) TABLET BY MOUTH AT BEDTIME AS NEEDED FOR SLEEP 01/22/18 10/18/19  Ursula Alert, MD    Physical Exam: Vitals:   03/12/20 0600 03/12/20 0900 03/12/20 1000 03/12/20 1133  BP: 124/67 136/79 116/69 111/62  Pulse: 86 78 83 84  Resp: 16  20 16   Temp:      TempSrc:      SpO2: 95% 98% 95% 97%  Weight:      Height:       General: Not in acute distress.  Dry mucous membrane HEENT:       Eyes: PERRL, EOMI, no scleral icterus.       ENT: No discharge from the ears and nose, no pharynx injection, no tonsillar enlargement.        Neck: No JVD, no bruit, no mass felt. Heme: No neck lymph node enlargement. Cardiac: S1/S2, RRR, No murmurs, No gallops or rubs. Respiratory: No rales, wheezing, rhonchi or rubs. GI: Soft, nondistended, nontender, no rebound pain, no organomegaly, BS present. GU: No hematuria Ext: No pitting leg  edema bilaterally. 2+DP/PT pulse bilaterally. Musculoskeletal: No joint deformities, No joint redness or warmth, no limitation of ROM in spin. Skin: No rashes.  Neuro: Alert, oriented X3, cranial nerves II-XII grossly intact, moves all extremities normally.  Psych: Patient is not psychotic, no suicidal or hemocidal ideation.  Labs on Admission: I have personally reviewed following labs and imaging studies  CBC: Recent Labs  Lab 03/12/20 0149  WBC 11.3*  HGB 15.4  HCT 47.6  MCV 94.8  PLT 622*   Basic Metabolic Panel: Recent Labs  Lab 03/12/20 0149 03/12/20 0335 03/12/20 0729  NA 134* 133* 134*  K 4.9 4.9 4.2  CL 93* 94* 99  CO2 14* 17* 23  GLUCOSE 501* 418* 228*  BUN 41* 41* 37*  CREATININE 1.85* 1.83* 1.27*  CALCIUM 9.0 8.9 8.4*   GFR: Estimated Creatinine Clearance: 79.8 mL/min (A) (by C-G formula based on SCr of 1.27 mg/dL (H)).  Liver Function Tests: Recent Labs  Lab 03/12/20 0149  AST 23  ALT 19  ALKPHOS 78  BILITOT 2.4*  PROT 7.5  ALBUMIN 3.9   No results for input(s): LIPASE, AMYLASE in the last 168 hours. No results for input(s): AMMONIA in the last 168 hours. Coagulation Profile: No results for input(s): INR, PROTIME in the last 168 hours. Cardiac Enzymes: No results for input(s): CKTOTAL, CKMB, CKMBINDEX, TROPONINI in the last 168 hours. BNP (last 3 results) No results for input(s): PROBNP in the last 8760 hours. HbA1C: No results for input(s): HGBA1C in the last 72 hours. CBG: Recent Labs  Lab 03/12/20 0609 03/12/20 0730 03/12/20 0851 03/12/20 1003 03/12/20 1257  GLUCAP 239* 217* 199* 160* 93   Lipid Profile: Recent Labs    03/12/20 1009  TRIG 235*   Thyroid Function Tests: No results for input(s): TSH, T4TOTAL, FREET4, T3FREE, THYROIDAB in the last 72 hours. Anemia Panel: Recent Labs    03/12/20 1009  FERRITIN 297   Urine analysis:    Component Value Date/Time   COLORURINE YELLOW (A) 03/12/2020 0335   APPEARANCEUR HAZY (A)  03/12/2020 0335   APPEARANCEUR Clear 03/02/2014 1943   LABSPEC 1.023 03/12/2020 0335   LABSPEC 1.017 03/02/2014 1943   PHURINE 5.0 03/12/2020 0335   GLUCOSEU >=500 (A) 03/12/2020 0335   GLUCOSEU >=500 03/02/2014 1943   HGBUR SMALL (A) 03/12/2020 0335   BILIRUBINUR NEGATIVE 03/12/2020 0335   BILIRUBINUR Negative 03/02/2014 1943   KETONESUR 80 (A) 03/12/2020 0335   PROTEINUR NEGATIVE 03/12/2020 0335   NITRITE NEGATIVE 03/12/2020 0335   LEUKOCYTESUR NEGATIVE 03/12/2020 0335   LEUKOCYTESUR Negative 03/02/2014 1943   Sepsis Labs: @LABRCNTIP (procalcitonin:4,lacticidven:4) ) Recent Results (from the past 240 hour(s))  Resp Panel by RT-PCR (Flu A&B, Covid)     Status: Abnormal   Collection Time: 03/12/20  3:35 AM   Specimen: Nasopharyngeal(NP) swabs in vial transport medium  Result Value Ref Range Status   SARS Coronavirus 2 by RT PCR POSITIVE (A) NEGATIVE Final    Comment: RESULT CALLED TO, READ BACK BY AND VERIFIED WITH: [facility] WALLACE AT [id number] 03/12/20.PMF (NOTE) SARS-CoV-2 target nucleic acids are DETECTED.  The SARS-CoV-2 RNA is generally detectable in upper respiratory specimens during the acute phase of infection. Positive results are indicative of the presence of the identified virus, but do not rule out bacterial infection or co-infection with other pathogens not detected by the test. Clinical correlation with patient history and other diagnostic information is necessary to determine patient infection status. The expected result is Negative.  Fact Sheet for Patients: EntrepreneurPulse.com.au  Fact Sheet for Healthcare Providers: IncredibleEmployment.be  This test is not yet approved or cleared by the [place] FDA and  has been authorized for detection and/or diagnosis of SARS-CoV-2 by FDA under an Emergency Use Authorization (EUA).  This EUA will remain in effect (meaning this test can  be used) for the duration of  the COVID-19  declaration under Section 564(b)(1) of the Act, 21 U.S.C. section 360bbb-3(b)(1), unless the authorization is terminated or revoked sooner.     Influenza A by PCR NEGATIVE NEGATIVE Final   Influenza B by PCR NEGATIVE NEGATIVE Final    Comment: (NOTE) The Xpert Xpress SARS-CoV-2/FLU/RSV plus assay is intended as an aid in the diagnosis of influenza from Nasopharyngeal swab specimens and should not be used as a sole basis for treatment. Nasal washings and aspirates are unacceptable for Xpert Xpress SARS-CoV-2/FLU/RSV testing.  Fact Sheet for Patients: EntrepreneurPulse.com.au  Fact Sheet for Healthcare  Providers: IncredibleEmployment.be  This test is not yet approved or cleared by the [place] and has been authorized for detection and/or diagnosis of SARS-CoV-2 by FDA under an Emergency Use Authorization (EUA). This EUA will remain in effect (meaning this test can be used) for the duration of the COVID-19 declaration under Section 564(b)(1) of the Act, 21 U.S.C. section 360bbb-3(b)(1), unless the authorization is terminated or revoked.  Performed at [facility], [address] [place], [place] [address]      Radiological Exams on Admission: DG Chest 2 View  Result Date: 03/12/2020 CLINICAL DATA:  Shortness of breath EXAM: CHEST - 2 VIEW COMPARISON:  03/02/2014 FINDINGS: The heart size and mediastinal contours are within normal limits. Both lungs are clear. The visualized skeletal structures are unremarkable. IMPRESSION: No active cardiopulmonary disease. Electronically Signed   By: Ulyses Jarred M.D.   On: 03/12/2020 02:22     EKG: I have personally reviewed.  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  Patient was initially treated with insulin drip and IV fluid in ED, his anion gap has closed for edema.  Patient received 2.6 L of LR.  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  Patient is taking NovoLog and Levemir at home -Levemir 12 units daily -Sliding scale insulin  Nausea vomiting and diarrhea due to COVID-19 virus infection: Patient has a positive COVID PCR.  Patient reports cough and shortness of breath, but no oxygen desaturation.  Chest x-ray negative.  Patient has COVID-19 gastroenteritis. -IV fluid as above -prn Imodium -Started remdesivir -Vitamin C -Zinc sulfate -Check inflammatory markers -prn albuterol and mucinex      DVT ppx: SQ Lovenox Code Status: Full code Family Communication:    Yes, patient's wife via phone  disposition Plan:  Anticipate discharge back to previous environment Consults called: None Admission status: Med-surg bed as inpt     Status is: Inpatient  Remains inpatient appropriate  because:Inpatient level of care appropriate due to severity of illness.  Patient has multiple complaints this, now presents COVID-19 infection with gastroenteritis, DKA, worsening renal function.  His presentation is highly complicated for patient is high risk of deteriorating kidney needs to be treated in hospital for at least 2 days   Dispo: The patient is from: Home              Anticipated d/c is to: Home              Anticipated  d/c date is: 2 days              Patient currently is not medically stable to d/c.          Date of Service 03/12/2020    Ivor Costa [facility]   If 7PM-7AM, please contact night-coverage [email or web address] 03/12/2020, 1:48 PM

## 2020-03-12 NOTE — ED Notes (Signed)
Notified provider Niu X of pt's continued HA; pt currently rating it 7/10. Pt requesting pain meds for head; already had tylenol. Awaiting orders from provider.

## 2020-03-12 NOTE — Progress Notes (Addendum)
Remdesivir - Pharmacy Brief Note   O:  ALT: 19 CXR: No active cardiopulmonary disease. SpO2: 95-96% on RA   A/P:  Patient is high risk and symptoms onset <5days with no respiratory symptoms. Will order 3 day course of remdesivir (Per [facility] COVID-19 Treatment guidance)   Remdesivir 200 mg IVPB once followed by 100 mg IVPB daily x 2 days.   Pernell Dupre, PharmD, BCPS Clinical Pharmacist 03/12/2020 8:22 AM

## 2020-03-12 NOTE — ED Provider Notes (Signed)
[facility] Emergency Department Provider Note    Event Date/Time   First MD Initiated Contact with Patient 03/12/20 0309     (approximate)  I have reviewed the triage vital signs and the nursing notes.   HISTORY  Chief Complaint Weakness    HPI Leonard Johnston is a 55 y.o. male with a history of type 1 diabetes presents to ER for 5 days of generalized weakness nausea vomiting watery diarrhea having trouble keeping any food or fluids down.  States his blood sugars have been running "through the roof.  "Has had some chills.  Does feel some shortness of breath no cough or congestion.    Past Medical History:  Diagnosis Date  . Anxiety   . Diabetes mellitus type I ([place])   . Diabetes mellitus without complication ([place])    type 1  . GERD (gastroesophageal reflux disease)   . Hypercholesteremia    Family History  Problem Relation Age of Onset  . Alcohol abuse Sister   . Drug abuse Sister   . Alcohol abuse Sister   . Drug abuse Sister    Past Surgical History:  Procedure Laterality Date  . amputaion of toe    . COLONOSCOPY WITH PROPOFOL N/A 06/24/2017   Procedure: COLONOSCOPY WITH PROPOFOL;  Surgeon: Manya Silvas, MD;  Location: [facility] ENDOSCOPY;  Service: Endoscopy;  Laterality: N/A;  . ESOPHAGOGASTRODUODENOSCOPY (EGD) WITH PROPOFOL N/A 06/24/2017   Procedure: ESOPHAGOGASTRODUODENOSCOPY (EGD) WITH PROPOFOL;  Surgeon: Manya Silvas, MD;  Location: [facility] ENDOSCOPY;  Service: Endoscopy;  Laterality: N/A;  . EYE SURGERY    . TOE SURGERY     Patient Active Problem List   Diagnosis Date Noted  . AKI (acute kidney injury) ([place]) 09/11/2015  . Anxiety 09/11/2015  . Essential hypertension 09/11/2015  . Pure hypercholesterolemia 09/11/2015  . 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 Proliferative diabetic retinopathy, both eyes ([place]) 06/19/2012  . Retinopathy, diabetic, proliferative ([place]) 06/04/2012  .  Acute osteomyelitis of ankle or foot ([place]) 12/16/2011  . Cellulitis of foot 12/12/2011      Prior to Admission medications   Medication Sig Start Date End Date Taking? Authorizing Provider  BD PEN NEEDLE MICRO U/F 32G X 6 MM MISC 4 (four) times daily. use as directed 12/16/17  Yes [provider]  Continuous Blood Gluc Receiver (DEXCOM G6 RECEIVER) DEVI Use as directed. [facility] [id number] 12/26/17  Yes [provider]  Continuous Blood Gluc Sensor (DEXCOM G6 SENSOR) MISC Use 1 Device every 10 (ten) days NDC [phone]. Run through Medicare partB 01/01/18  Yes [provider]  Continuous Blood Gluc Transmit (DEXCOM G6 TRANSMITTER) MISC Use as directed, every 3 months. [place] [phone]. Run through Medicare partB 01/01/18  Yes [provider]  insulin aspart (NOVOLOG) 100 UNIT/ML FlexPen Inject into the skin 3 (three) times daily with meals. Per sliding scale on smart phone.   Yes [provider]  insulin detemir (LEVEMIR) 100 UNIT/ML injection Inject 16 Units into the skin at bedtime.   Yes [provider]  Multiple Vitamin (MULTI-VITAMINS) TABS Take by mouth.   Yes [provider]  omeprazole (PRILOSEC) 20 MG capsule Take 20 mg by mouth daily.   Yes [provider]  prazosin (MINIPRESS) 5 MG capsule Take 1 capsule (5 mg total) by mouth at bedtime. For nightmares 01/22/18 10/18/19  Ursula Alert, MD  propranolol (INDERAL) 10 MG tablet Take 1 tablet (10 mg total) by mouth 3 (three)  times daily as needed. Only for severe agitation/anxiety 01/22/18 10/18/19  Ursula Alert, MD  ranitidine (ZANTAC) 75 MG tablet Take 75 mg by mouth 2 (two) times daily.  10/18/19  [provider]  sertraline (ZOLOFT) 50 MG tablet Take 1.5 tablets (75 mg total) by mouth daily. 01/22/18 10/18/19  Ursula Alert, MD  traZODone (DESYREL) 50 MG tablet TAKE 1/2 TO 1 (ONE-HALF TO ONE) TABLET BY MOUTH AT BEDTIME AS NEEDED FOR SLEEP 01/22/18  10/18/19  Ursula Alert, MD    Allergies Patient has no known allergies.    Social History Social History   Tobacco Use  . Smoking status: Former Smoker    Quit date: 06/24/2009    Years since quitting: 10.7  . Smokeless tobacco: Never Used  Vaping Use  . Vaping Use: Never used  Substance Use Topics  . Alcohol use: No  . Drug use: No    Review of Systems Patient denies headaches, rhinorrhea, blurry vision, numbness, shortness of breath, chest pain, edema, cough, abdominal pain, nausea, vomiting, diarrhea, dysuria, fevers, rashes or hallucinations unless otherwise stated above in HPI. ____________________________________________   PHYSICAL EXAM:  VITAL SIGNS: Vitals:   03/12/20 0138 03/12/20 0139  BP:  126/73  Pulse: 99   Resp: 16   Temp: 98.2 F (36.8 C)   SpO2: 100%     Constitutional: Alert and oriented.  Eyes: Conjunctivae are normal.  Head: Atraumatic. Nose: No congestion/rhinnorhea. Mouth/Throat: Mucous membranes are dry.   Neck: No stridor. Painless ROM.  Cardiovascular: Normal rate, regular rhythm. Grossly normal heart sounds.  Good peripheral circulation. Respiratory: Normal respiratory effort.  No retractions. Lungs CTAB. Gastrointestinal: Soft and nontender. No distention. No abdominal bruits. No CVA tenderness. Genitourinary: deferred Musculoskeletal: No lower extremity tenderness nor edema.  No joint effusions. Neurologic:  Normal speech and language. No gross focal neurologic deficits are appreciated. No facial droop Skin:  Skin is warm, dry and intact. No rash noted. Psychiatric: Mood and affect are normal. Speech and behavior are normal.  ____________________________________________   LABS (all labs ordered are listed, but only abnormal results are displayed)  Results for orders placed or performed during the hospital encounter of 03/12/20 (from the past 24 hour(s))  CBC     Status: Abnormal   Collection Time: 03/12/20  1:49 AM  Result  Value Ref Range   WBC 11.3 (H) 4.0 - 10.5 K/uL   RBC 5.02 4.22 - 5.81 MIL/uL   Hemoglobin 15.4 13.0 - 17.0 g/dL   HCT 47.6 39.0 - 52.0 %   MCV 94.8 80.0 - 100.0 fL   MCH 30.7 26.0 - 34.0 pg   MCHC 32.4 30.0 - 36.0 g/dL   RDW 13.2 11.5 - 15.5 %   Platelets 140 (L) 150 - 400 K/uL   nRBC 0.0 0.0 - 0.2 %  Comprehensive metabolic panel     Status: Abnormal   Collection Time: 03/12/20  1:49 AM  Result Value Ref Range   Sodium 134 (L) 135 - 145 mmol/L   Potassium 4.9 3.5 - 5.1 mmol/L   Chloride 93 (L) 98 - 111 mmol/L   CO2 14 (L) 22 - 32 mmol/L   Glucose, Bld 501 (HH) 70 - 99 mg/dL   BUN 41 (H) 6 - 20 mg/dL   Creatinine, Ser 1.85 (H) 0.61 - 1.24 mg/dL   Calcium 9.0 8.9 - 10.3 mg/dL   Total Protein 7.5 6.5 - 8.1 g/dL   Albumin 3.9 3.5 - 5.0 g/dL   AST 23 15 - 41  U/L   ALT 19 0 - 44 U/L   Alkaline Phosphatase 78 38 - 126 U/L   Total Bilirubin 2.4 (H) 0.3 - 1.2 mg/dL   GFR, Estimated 43 (L) >60 mL/min   Anion gap 27 (H) 5 - 15  Troponin I (High Sensitivity)     Status: None   Collection Time: 03/12/20  1:49 AM  Result Value Ref Range   Troponin I (High Sensitivity) 10 <18 ng/L  CBG monitoring, ED     Status: Abnormal   Collection Time: 03/12/20  3:56 AM  Result Value Ref Range   Glucose-Capillary 410 (H) 70 - 99 mg/dL   ____________________________________________  EKG My review and personal interpretation at Time: 1:36   Indication: n/v  Rate: 100  Rhythm: sinus Axis: normal Other: normal intervals, no stemi ____________________________________________  RADIOLOGY  I personally reviewed all radiographic images ordered to evaluate for the above acute complaints and reviewed radiology reports and findings.  These findings were personally discussed with the patient.  Please see medical record for radiology report.  ____________________________________________   PROCEDURES  Procedure(s) performed:  .Critical Care Performed by: Merlyn Lot, MD Authorized by:  Merlyn Lot, MD   Critical care provider statement:    Critical care time (minutes):  34   Critical care time was exclusive of:  Separately billable procedures and treating other patients   Critical care was necessary to treat or prevent imminent or life-threatening deterioration of the following conditions:  Endocrine crisis   Critical care was time spent personally by me on the following activities:  Development of treatment plan with patient or surrogate, discussions with consultants, evaluation of patient's response to treatment, examination of patient, obtaining history from patient or surrogate, ordering and performing treatments and interventions, ordering and review of laboratory studies, ordering and review of radiographic studies, pulse oximetry, re-evaluation of patient's condition and review of old charts      Critical Care performed: yes ____________________________________________   INITIAL IMPRESSION / [place] / ED COURSE  Pertinent labs & imaging results that were available during my care of the patient were reviewed by me and considered in my medical decision making (see chart for details).   DDX: dka, hhs, dehydration, covid, pna, uti, electrolyte abn  MATTIX IMHOF is a 55 y.o. who presents to the ED with presentation as described above.  Patient nontoxic-appearing does have blood work presentation concerning for DKA.  Will give IV fluid resuscitation we will start IV insulin infusion.  The patient will be placed on continuous pulse oximetry and telemetry for monitoring.  Laboratory evaluation will be sent to evaluate for the above complaints.     Clinical Course as of 03/12/20 0417  Sat Mar 12, 2020  3235 Chest x-ray reassuring.  Patient with mild AKI given his metabolic acidosis with high anion gap we'll continue with IV insulin infusion and admission to the hospital.  Patient agreeable to plan. [PR]    Clinical Course User Index [PR] Merlyn Lot, MD    The patient was evaluated in Emergency Department today for the symptoms described in the history of present illness. He/she was evaluated in the context of the global COVID-19 pandemic, which necessitated consideration that the patient might be at risk for infection with the SARS-CoV-2 virus that causes COVID-19. Institutional protocols and algorithms that pertain to the evaluation of patients at risk for COVID-19 are in a state of rapid change based on information released by regulatory bodies including the CDC and  federal and state organizations. These policies and algorithms were followed during the patient's care in the ED.  As part of my medical decision making, I reviewed the following data within the [place] notes reviewed and incorporated, Labs reviewed, notes from prior ED visits and [place] Controlled Substance Database   ____________________________________________   FINAL CLINICAL IMPRESSION(S) / ED DIAGNOSES  Final diagnoses:  Diabetic ketoacidosis without coma associated with type 1 diabetes mellitus ([place])      NEW MEDICATIONS STARTED DURING THIS VISIT:  New Prescriptions   No medications on file     Note:  This document was prepared using Dragon voice recognition software and may include unintentional dictation errors.    Merlyn Lot, MD 03/12/20 [phone]

## 2020-03-12 NOTE — ED Notes (Signed)
Dr Niu at bedside 

## 2020-03-13 DIAGNOSIS — U071 COVID-19: Secondary | ICD-10-CM | POA: Diagnosis not present

## 2020-03-13 DIAGNOSIS — N1831 Chronic kidney disease, stage 3a: Secondary | ICD-10-CM | POA: Diagnosis not present

## 2020-03-13 DIAGNOSIS — N179 Acute kidney failure, unspecified: Secondary | ICD-10-CM | POA: Diagnosis not present

## 2020-03-13 DIAGNOSIS — E1010 Type 1 diabetes mellitus with ketoacidosis without coma: Secondary | ICD-10-CM | POA: Diagnosis not present

## 2020-03-13 LAB — CBC WITH DIFFERENTIAL/PLATELET
Abs Immature Granulocytes: 0.01 10*3/uL (ref 0.00–0.07)
Basophils Absolute: 0 10*3/uL (ref 0.0–0.1)
Basophils Relative: 0 %
Eosinophils Absolute: 0 10*3/uL (ref 0.0–0.5)
Eosinophils Relative: 1 %
HCT: 40.8 % (ref 39.0–52.0)
Hemoglobin: 13.7 g/dL (ref 13.0–17.0)
Immature Granulocytes: 0 %
Lymphocytes Relative: 26 %
Lymphs Abs: 1.2 10*3/uL (ref 0.7–4.0)
MCH: 30.7 pg (ref 26.0–34.0)
MCHC: 33.6 g/dL (ref 30.0–36.0)
MCV: 91.5 fL (ref 80.0–100.0)
Monocytes Absolute: 0.4 10*3/uL (ref 0.1–1.0)
Monocytes Relative: 9 %
Neutro Abs: 2.9 10*3/uL (ref 1.7–7.7)
Neutrophils Relative %: 64 %
Platelets: 104 10*3/uL — ABNORMAL LOW (ref 150–400)
RBC: 4.46 MIL/uL (ref 4.22–5.81)
RDW: 13.4 % (ref 11.5–15.5)
WBC: 4.5 10*3/uL (ref 4.0–10.5)
nRBC: 0 % (ref 0.0–0.2)

## 2020-03-13 LAB — COMPREHENSIVE METABOLIC PANEL
ALT: 11 U/L (ref 0–44)
AST: 19 U/L (ref 15–41)
Albumin: 3 g/dL — ABNORMAL LOW (ref 3.5–5.0)
Alkaline Phosphatase: 58 U/L (ref 38–126)
Anion gap: 8 (ref 5–15)
BUN: 33 mg/dL — ABNORMAL HIGH (ref 6–20)
CO2: 29 mmol/L (ref 22–32)
Calcium: 8.9 mg/dL (ref 8.9–10.3)
Chloride: 101 mmol/L (ref 98–111)
Creatinine, Ser: 1.2 mg/dL (ref 0.61–1.24)
GFR, Estimated: >60 mL/min (ref >60)
Glucose, Bld: 306 mg/dL — ABNORMAL HIGH (ref 70–99)
Potassium: 4.7 mmol/L (ref 3.5–5.1)
Sodium: 138 mmol/L (ref 135–145)
Total Bilirubin: 0.6 mg/dL (ref 0.3–1.2)
Total Protein: 5.9 g/dL — ABNORMAL LOW (ref 6.5–8.1)

## 2020-03-13 LAB — GLUCOSE, CAPILLARY
Glucose-Capillary: 371 mg/dL — ABNORMAL HIGH (ref 70–99)
Glucose-Capillary: 310 mg/dL — ABNORMAL HIGH (ref 70–99)
Glucose-Capillary: 127 mg/dL — ABNORMAL HIGH (ref 70–99)
Glucose-Capillary: 213 mg/dL — ABNORMAL HIGH (ref 70–99)

## 2020-03-13 LAB — MAGNESIUM: Magnesium: 1.9 mg/dL (ref 1.7–2.4)

## 2020-03-13 LAB — FIBRIN DERIVATIVES D-DIMER (ARMC ONLY): Fibrin derivatives D-dimer (ARMC): 460.67 ng/mL (FEU) (ref 0.00–499.00)

## 2020-03-13 LAB — FERRITIN: Ferritin: 240 ng/mL (ref 24–336)

## 2020-03-13 LAB — C-REACTIVE PROTEIN: CRP: 3.9 mg/dL — ABNORMAL HIGH (ref <1.0)

## 2020-03-13 MED ORDER — INSULIN DETEMIR 100 UNIT/ML ~~LOC~~ SOLN
18.0000 [IU] | Freq: Every day | SUBCUTANEOUS | Status: DC
  Administered 2020-03-13: 18 [IU] via SUBCUTANEOUS
  Filled 2020-03-13 (×2): qty 0.18

## 2020-03-13 MED ORDER — INSULIN ASPART 100 UNIT/ML ~~LOC~~ SOLN
5.0000 [IU] | Freq: Three times a day (TID) | SUBCUTANEOUS | Status: DC
  Administered 2020-03-13 – 2020-03-15 (×6): 5 [IU] via SUBCUTANEOUS
  Filled 2020-03-13 (×6): qty 1

## 2020-03-13 NOTE — Progress Notes (Signed)
PROGRESS NOTE    Leonard Johnston   EHU:[id number]  DOB: 10/28/1965  PCP: Derinda Late, MD    DOA: 03/12/2020 LOS: 1   Brief Narrative   Leonard Johnston is a 55 y.o. male with medical history significant of hypertension, hyperlipidemia, type 1 diabetes, GERD, anxiety, CKD stage IIIa, who presented to the ED on 03/12/20 with shortness of breath, nausea, vomiting, diarrhea, generalized weakness, poor appetite x over 5 days.  Positive Covid-19  PCR.  Found to be in DKA with anion gap 27, bicarb 14, glucose 501, ketonuria, elevate beta-hydroxybutyrate, pH 7.24 on VBG.  Renal function with AKI on CKD stage 3a with creatinie 1.83 (baseline ~1.4).  Mild leukocytosis on labs.  Chest xray unremarkable.  No hypoxia, afebrile, stable vitals.   Treated with insulin drip in the ED, gap closed prior to time of admission.  Admitted to med/surg for further management.    Started on remdesivir and supportive care for Covid-19 infection.       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   --Continue remdesivir --No hypoxia, defer steroids --Follow inflammatory markers, CMP, CBC --Mucinex, vitamins, zinc, bronchodilator --O2 if needed to keep spO2 at or above 88%   DKA - POA, off insulin drip before admitted to floor.   --Titrate insulin for inpatient goal 140-180 --Levemir 18 units qHS --Novolog 5 units TID WC + sliding scale   AKI superimposed on CKD stage 3a - due to dehydration with DKA.  Improved with IVF's. --monitor BMP --avoid nephrotoxins and hypotension --renally dose meds   Hypertension -  chronic, stable.  Not on antihypertensives outpatient.  PRN hydralazine.  Hyperlipidemia - Not on medication. PCP follow up.  Uncontrolled Type 1 diabetes - Takes Novolog and Levemir at home, uses continuous glucose monitor. --Levemir and Novolog per orders --Adjust insulin as needed  --A1c pending  Nausea, vomiting, diarrhea - resolved.  Likely due to Covid-19 and DKA.  Monitor. Tolerating diet. --Covid tx as above --PRN Imodium, Zofran     Patient BMI: Body mass index is 19.82 kg/m.   DVT prophylaxis: enoxaparin (LOVENOX) injection 40 mg Start: 03/12/20 2200   Diet:  Diet Orders (From admission, onward)    Start     Ordered   03/12/20 1007  Diet Carb Modified Fluid consistency: Thin; Room service appropriate? Yes  Diet effective now       Comments: COVID +  Question Answer Comment  Diet-HS Snack? Nothing   Calorie Level Medium 1600-2000   Fluid consistency: Thin   Room service appropriate? Yes      03/12/20 1006            Code Status: Full Code    Subjective 03/13/20    Pt feeling well overall.  Headache resolved.  Nausea better.  Appetite improved.  No SOB, F/C, CP or other complaints.   Disposition Plan & Communication   Status is: Inpatient  Remains inpatient appropriate because:IV treatments appropriate due to intensity of illness or inability to take PO   Dispo: The patient is from: Home  Anticipated d/c is to: Home              Anticipated d/c date is: 2 days              Patient currently is not medically stable to d/c.    Consults, Procedures, Significant Events   Consultants:   None  Procedures:   None  Antimicrobials:  Anti-infectives (From admission, onward)   Start     Dose/Rate Route Frequency Ordered Stop   03/13/20 1000  remdesivir 100 mg in sodium chloride 0.9 % 100 mL IVPB       "Followed by" Linked Group Details   100 mg 200 mL/hr over 30 Minutes Intravenous Daily 03/12/20 0827 03/15/20 0959   03/12/20 0930   remdesivir 200 mg in sodium chloride 0.9% 250 mL IVPB       "Followed by" Linked Group Details   200 mg 580 mL/hr over 30 Minutes Intravenous Once 03/12/20 0827 03/12/20 1050         Objective   Vitals:   03/12/20 2207 03/13/20 0052 03/13/20 0459 03/13/20 0747  BP: 120/80 (!) 158/87 104/63 116/75  Pulse: 86 81 70 82  Resp: 18 19 18 18   Temp: 98.2 F (36.8 C) 98.1 F (36.7 C) 97.8 F (36.6 C) 97.8 F (36.6 C)  TempSrc:      SpO2: 100% 98% 98% 98%  Weight:      Height:        Intake/Output Summary (Last 24 hours) at 03/13/2020 0824 Last data filed at 03/12/2020 1257 Gross per 24 hour  Intake 843.11 ml  Output -  Net 843.11 ml   Filed Weights   03/12/20 0139  Weight: 83.9 kg    Physical Exam:  General exam: awake, alert, no acute distress HEENT: atraumatic, clear conjunctiva, anicteric sclera, moist mucus membranes, hearing grossly normal  Respiratory system: CTAB, no wheezes, rales or rhonchi, normal respiratory effort. Cardiovascular system: normal S1/S2, RRR, no JVD, murmurs, rubs, gallops, no pedal edema.   Gastrointestinal system: soft, NT, ND, no HSM felt, +bowel sounds. Central nervous system: A&O x4. no gross focal neurologic deficits, normal speech Extremities: moves all, no edema, normal tone Skin: dry, intact, normal temperature, normal color Psychiatry: normal mood, congruent affect, judgement and insight appear normal  Labs   Data Reviewed: I have personally reviewed following labs and imaging studies  CBC: Recent Labs  Lab 03/12/20 0149 03/13/20 0505  WBC 11.3* 4.5  NEUTROABS  --  2.9  HGB 15.4 13.7  HCT 47.6 40.8  MCV 94.8 91.5  PLT 140* [id number]*   Basic Metabolic Panel: Recent Labs  Lab 03/12/20 0149 03/12/20 0335 03/12/20 0729 03/13/20 0505  NA 134* 133* 134* 138  K 4.9 4.9 4.2 4.7  CL 93* 94* 99 101  CO2 14* 17* 23 29  GLUCOSE 501* 418* 228* 306*  BUN 41* 41* 37* 33*  CREATININE 1.85* 1.83* 1.27* 1.20  CALCIUM 9.0 8.9 8.4* 8.9   MG  --   --   --  1.9   GFR: Estimated Creatinine Clearance: 84.5 mL/min (by C-G formula based on SCr of 1.2 mg/dL). Liver Function Tests: Recent Labs  Lab 03/12/20 0149 03/13/20 0505  AST 23 19  ALT 19 11  ALKPHOS 78 58  BILITOT 2.4* 0.6  PROT 7.5 5.9*  ALBUMIN 3.9 3.0*   No results for input(s): LIPASE, AMYLASE in the last 168 hours. No results for input(s): AMMONIA in the last 168 hours. Coagulation Profile: No results for  input(s): INR, PROTIME in the last 168 hours. Cardiac Enzymes: No results for input(s): CKTOTAL, CKMB, CKMBINDEX, TROPONINI in the last 168 hours. BNP (last 3 results) No results for input(s): PROBNP in the last 8760 hours. HbA1C: No results for input(s): HGBA1C in the last 72 hours. CBG: Recent Labs  Lab 03/12/20 1257 03/12/20 1726 03/12/20 2022 03/12/20 2214 03/13/20 0805  GLUCAP 93 139* 153* 162* 371*   Lipid Profile: Recent Labs    03/12/20 1009  TRIG 235*   Thyroid Function Tests: No results for input(s): TSH, T4TOTAL, FREET4, T3FREE, THYROIDAB in the last 72 hours. Anemia Panel: Recent Labs    03/12/20 1009 03/13/20 0505  FERRITIN 297 240   Sepsis Labs: Recent Labs  Lab 03/12/20 1009  PROCALCITON 0.36    Recent Results (from the past 240 hour(s))  Resp Panel by RT-PCR (Flu A&B, Covid)     Status: Abnormal   Collection Time: 03/12/20  3:35 AM   Specimen: Nasopharyngeal(NP) swabs in vial transport medium  Result Value Ref Range Status   SARS Coronavirus 2 by RT PCR POSITIVE (A) NEGATIVE Final    Comment: RESULT CALLED TO, READ BACK BY AND VERIFIED WITH: [facility] AT [id number] 03/12/20.PMF (NOTE) SARS-CoV-2 target nucleic acids are DETECTED.  The SARS-CoV-2 RNA is generally detectable in upper respiratory specimens during the acute phase of infection. Positive results are indicative of the presence of the identified virus, but do not rule out bacterial infection or co-infection with other pathogens not detected by  the test. Clinical correlation with patient history and other diagnostic information is necessary to determine patient infection status. The expected result is Negative.  Fact Sheet for Patients: EntrepreneurPulse.com.au  Fact Sheet for Healthcare Providers: IncredibleEmployment.be  This test is not yet approved or cleared by the [place] FDA and  has been authorized for detection and/or diagnosis of SARS-CoV-2 by FDA under an Emergency Use Authorization (EUA).  This EUA will remain in effect (meaning this test can  be used) for the duration of  the COVID-19 declaration under Section 564(b)(1) of the Act, 21 U.S.C. section 360bbb-3(b)(1), unless the authorization is terminated or revoked sooner.     Influenza A by PCR NEGATIVE NEGATIVE Final   Influenza B by PCR NEGATIVE NEGATIVE Final    Comment: (NOTE) The Xpert Xpress SARS-CoV-2/FLU/RSV plus assay is intended as an aid in the diagnosis of influenza from Nasopharyngeal swab specimens and should not be used as a sole basis for treatment. Nasal washings and aspirates are unacceptable for Xpert Xpress SARS-CoV-2/FLU/RSV testing.  Fact Sheet for Patients: EntrepreneurPulse.com.au  Fact Sheet for Healthcare Providers: IncredibleEmployment.be  This test is not yet approved or cleared by the [place] FDA and has been authorized for detection and/or diagnosis of SARS-CoV-2 by FDA under an Emergency Use Authorization (EUA). This EUA will remain in effect (meaning this test can be used) for the duration of the COVID-19 declaration under Section 564(b)(1) of the Act, 21 U.S.C. section 360bbb-3(b)(1), unless the authorization is terminated or revoked.  Performed at [facility], [address] [place], [place] [address]   Culture, blood (Routine X 2) w Reflex to ID Panel     Status: None (Preliminary result)   Collection Time: 03/12/20 10:09  AM   Specimen: BLOOD  Result Value Ref Range Status   Specimen Description BLOOD BLOOD RIGHT FOREARM  Final   Special Requests   Final    BOTTLES DRAWN AEROBIC AND ANAEROBIC Blood Culture results may not be optimal due  to an excessive volume of blood received in culture bottles   Culture   Final    NO GROWTH < 24 HOURS Performed at [facility], [address] [place], [place] [address]    Report Status PENDING  Incomplete  Culture, blood (Routine X 2) w Reflex to ID Panel     Status: None (Preliminary result)   Collection Time: 03/12/20 10:10 AM   Specimen: BLOOD  Result Value Ref Range Status   Specimen Description BLOOD BLOOD LEFT FOREARM  Final   Special Requests   Final    BOTTLES DRAWN AEROBIC AND ANAEROBIC Blood Culture results may not be optimal due to an excessive volume of blood received in culture bottles   Culture   Final    NO GROWTH < 24 HOURS Performed at [facility], [address] [place], [place] [address]    Report Status PENDING  Incomplete      Imaging Studies   DG Chest 2 View  Result Date: 03/12/2020 CLINICAL DATA:  Shortness of breath EXAM: CHEST - 2 VIEW COMPARISON:  03/02/2014 FINDINGS: The heart size and mediastinal contours are within normal limits. Both lungs are clear. The visualized skeletal structures are unremarkable. IMPRESSION: No active cardiopulmonary disease. Electronically Signed   By: Ulyses Jarred M.D.   On: 03/12/2020 02:22     Medications   Scheduled Meds: . vitamin C  500 mg Oral Daily  . enoxaparin (LOVENOX) injection  40 mg Subcutaneous Q24H  . insulin aspart  0-5 Units Subcutaneous QHS  . insulin aspart  0-9 Units Subcutaneous TID WC  . insulin detemir  12 Units Subcutaneous QHS  . multivitamin with minerals  1 tablet Oral Daily  . pantoprazole  40 mg Oral Daily  . zinc sulfate  220 mg Oral Daily   Continuous Infusions: . remdesivir 100 mg in NS 100 mL         LOS: 1 day    Time spent: 30  minutes    Ezekiel Slocumb, DO [facility]  03/13/2020, 8:24 AM    If 7PM-7AM, please contact night-coverage. How to contact the [facility] Attending or Consulting provider [place] or covering provider during after hours [place], for this patient?    1. Check the care team in [facility] and look for a) attending/consulting TR[facility] provider listed and b) the [facility] team listed 2. Log into [email or web address] and use [place]'s universal password to access. If you do not have the password, please contact the hospital operator. 3. Locate the [facility] provider you are looking for under [facility] and page to a number that you can be directly reached. 4. If you still have difficulty reaching the provider, please page the [facility] (Director on Call) for the Hospitalists listed on amion for assistance.

## 2020-03-13 NOTE — Plan of Care (Signed)
  Problem: Education: Goal: Knowledge of General Education information will improve Description: Including pain rating scale, medication(s)/side effects and non-pharmacologic comfort measures Outcome: Progressing   Problem: Health Behavior/Discharge Planning: Goal: Ability to manage health-related needs will improve Outcome: Progressing   Problem: Clinical Measurements: Goal: Ability to maintain clinical measurements within normal limits will improve Outcome: Progressing Goal: Will remain free from infection Outcome: Progressing Goal: Diagnostic test results will improve Outcome: Progressing Goal: Respiratory complications will improve Outcome: Progressing Goal: Cardiovascular complication will be avoided Outcome: Progressing   Problem: Activity: Goal: Risk for activity intolerance will decrease Outcome: Progressing   Problem: Nutrition: Goal: Adequate nutrition will be maintained Outcome: Progressing   Problem: Coping: Goal: Level of anxiety will decrease Outcome: Progressing   Problem: Elimination: Goal: Will not experience complications related to bowel motility Outcome: Progressing Goal: Will not experience complications related to urinary retention Outcome: Progressing   Problem: Pain Managment: Goal: General experience of comfort will improve Outcome: Progressing   Problem: Safety: Goal: Ability to remain free from injury will improve Outcome: Progressing   Problem: Skin Integrity: Goal: Risk for impaired skin integrity will decrease Outcome: Progressing   Problem: Respiratory: Goal: Will maintain a patent airway Outcome: Progressing Goal: Complications related to the disease process, condition or treatment will be avoided or minimized Outcome: Progressing   Problem: Education: Goal: Knowledge of risk factors and measures for prevention of condition will improve Outcome: Progressing   Problem: Fluid Volume: Goal: Ability to maintain a balanced  intake and output will improve Outcome: Progressing   Problem: Metabolic: Goal: Ability to maintain appropriate glucose levels will improve Outcome: Progressing

## 2020-03-13 NOTE — Progress Notes (Signed)
Inpatient Diabetes Program Recommendations  AACE/ADA: New Consensus Statement on Inpatient Glycemic Control (2015)  Target Ranges:  Prepandial:   less than 140 mg/dL      Peak postprandial:   less than 180 mg/dL (1-2 hours)      Critically ill patients:  140 - 180 mg/dL   Lab Results  Component Value Date   GLUCAP 371 (H) 03/13/2020    Review of Glycemic Control  Diabetes history: DM1 Outpatient Diabetes medications: Levemir 16 units QHS, Novolog s/s Current orders for Inpatient glycemic control: Levemir 18 units QHS, Novolog 0-9 units tidwc and 0-5 HS + 5 units tidwc  Steroids d/ced. No HgbA1C results.  Inpatient Diabetes Program Recommendations:     If FBS > 180 mg/dL, increase Levemir to 22 units QHS Please order HgbA1C to assess glycemic control prior to admission.  Continue to follow.  Thank you. Lorenda Peck, RD, LDN, CDE Inpatient Diabetes Coordinator [phone]

## 2020-03-13 NOTE — Plan of Care (Signed)

## 2020-03-13 NOTE — Hospital Course (Signed)
Leonard Johnston is a 55 y.o. male with medical history significant of hypertension, hyperlipidemia, type 1 diabetes, GERD, anxiety, CKD stage IIIa, who presented to the ED on 03/12/20 with shortness of breath, nausea, vomiting, diarrhea, generalized weakness, poor appetite x over 5 days.  Positive Covid-19  PCR.  Found to be in DKA with anion gap 27, bicarb 14, glucose 501, ketonuria, elevate beta-hydroxybutyrate, pH 7.24 on VBG.  Renal function with AKI on CKD stage 3a with creatinie 1.83 (baseline ~1.4).  Mild leukocytosis on labs.  Chest xray unremarkable.  No hypoxia, afebrile, stable vitals.   Treated with insulin drip in the ED, gap closed prior to time of admission.  Admitted to med/surg for further management.    Started on remdesivir and supportive care for Covid-19 infection.

## 2020-03-14 DIAGNOSIS — R197 Diarrhea, unspecified: Secondary | ICD-10-CM | POA: Diagnosis not present

## 2020-03-14 DIAGNOSIS — U071 COVID-19: Secondary | ICD-10-CM | POA: Diagnosis not present

## 2020-03-14 DIAGNOSIS — E1010 Type 1 diabetes mellitus with ketoacidosis without coma: Secondary | ICD-10-CM | POA: Diagnosis not present

## 2020-03-14 DIAGNOSIS — R112 Nausea with vomiting, unspecified: Secondary | ICD-10-CM | POA: Diagnosis not present

## 2020-03-14 LAB — COMPREHENSIVE METABOLIC PANEL
ALT: 14 U/L (ref 0–44)
AST: 22 U/L (ref 15–41)
Albumin: 3.1 g/dL — ABNORMAL LOW (ref 3.5–5.0)
Alkaline Phosphatase: 66 U/L (ref 38–126)
Anion gap: 8 (ref 5–15)
BUN: 29 mg/dL — ABNORMAL HIGH (ref 6–20)
CO2: 32 mmol/L (ref 22–32)
Calcium: 8.6 mg/dL — ABNORMAL LOW (ref 8.9–10.3)
Chloride: 100 mmol/L (ref 98–111)
Creatinine, Ser: 1.06 mg/dL (ref 0.61–1.24)
GFR, Estimated: >60 mL/min (ref >60)
Glucose, Bld: 85 mg/dL (ref 70–99)
Potassium: 3.4 mmol/L — ABNORMAL LOW (ref 3.5–5.1)
Sodium: 140 mmol/L (ref 135–145)
Total Bilirubin: 0.5 mg/dL (ref 0.3–1.2)
Total Protein: 6.3 g/dL — ABNORMAL LOW (ref 6.5–8.1)

## 2020-03-14 LAB — GLUCOSE, CAPILLARY
Glucose-Capillary: 78 mg/dL (ref 70–99)
Glucose-Capillary: 91 mg/dL (ref 70–99)
Glucose-Capillary: 171 mg/dL — ABNORMAL HIGH (ref 70–99)
Glucose-Capillary: 200 mg/dL — ABNORMAL HIGH (ref 70–99)

## 2020-03-14 LAB — CBC WITH DIFFERENTIAL/PLATELET
Abs Immature Granulocytes: 0.03 10*3/uL (ref 0.00–0.07)
Basophils Absolute: 0 10*3/uL (ref 0.0–0.1)
Basophils Relative: 0 %
Eosinophils Absolute: 0 10*3/uL (ref 0.0–0.5)
Eosinophils Relative: 1 %
HCT: 41 % (ref 39.0–52.0)
Hemoglobin: 14.2 g/dL (ref 13.0–17.0)
Immature Granulocytes: 1 %
Lymphocytes Relative: 31 %
Lymphs Abs: 1.1 10*3/uL (ref 0.7–4.0)
MCH: 31.1 pg (ref 26.0–34.0)
MCHC: 34.6 g/dL (ref 30.0–36.0)
MCV: 89.9 fL (ref 80.0–100.0)
Monocytes Absolute: 0.4 10*3/uL (ref 0.1–1.0)
Monocytes Relative: 11 %
Neutro Abs: 2 10*3/uL (ref 1.7–7.7)
Neutrophils Relative %: 56 %
Platelets: 108 10*3/uL — ABNORMAL LOW (ref 150–400)
RBC: 4.56 MIL/uL (ref 4.22–5.81)
RDW: 13.2 % (ref 11.5–15.5)
WBC: 3.5 10*3/uL — ABNORMAL LOW (ref 4.0–10.5)
nRBC: 0 % (ref 0.0–0.2)

## 2020-03-14 LAB — HEMOGLOBIN A1C
Hgb A1c MFr Bld: 9.1 % — ABNORMAL HIGH (ref 4.8–5.6)
Mean Plasma Glucose: 214.47 mg/dL

## 2020-03-14 LAB — FERRITIN: Ferritin: 179 ng/mL (ref 24–336)

## 2020-03-14 LAB — C-REACTIVE PROTEIN: CRP: 3.2 mg/dL — ABNORMAL HIGH (ref <1.0)

## 2020-03-14 LAB — MAGNESIUM: Magnesium: 1.8 mg/dL (ref 1.7–2.4)

## 2020-03-14 LAB — FIBRIN DERIVATIVES D-DIMER (ARMC ONLY): Fibrin derivatives D-dimer (ARMC): 439.11 ng/mL (FEU) (ref 0.00–499.00)

## 2020-03-14 MED ORDER — INSULIN DETEMIR 100 UNIT/ML ~~LOC~~ SOLN
16.0000 [IU] | Freq: Every day | SUBCUTANEOUS | Status: DC
  Administered 2020-03-15: 16 [IU] via SUBCUTANEOUS
  Filled 2020-03-14 (×2): qty 0.16

## 2020-03-14 MED ORDER — BENZOCAINE 10 % MT GEL
Freq: Four times a day (QID) | OROMUCOSAL | Status: DC | PRN
  Filled 2020-03-14 (×3): qty 9

## 2020-03-14 MED ORDER — MELATONIN 5 MG PO TABS
5.0000 mg | ORAL_TABLET | Freq: Every day | ORAL | Status: DC
  Administered 2020-03-14 (×2): 5 mg via ORAL
  Filled 2020-03-14 (×3): qty 1

## 2020-03-14 MED ORDER — TRAZODONE HCL 50 MG PO TABS
50.0000 mg | ORAL_TABLET | Freq: Every day | ORAL | Status: DC
  Administered 2020-03-14: 50 mg via ORAL
  Filled 2020-03-14: qty 1

## 2020-03-14 MED ORDER — POTASSIUM CHLORIDE CRYS ER 20 MEQ PO TBCR
40.0000 meq | EXTENDED_RELEASE_TABLET | Freq: Once | ORAL | Status: AC
  Administered 2020-03-14: 18:00:00 40 meq via ORAL
  Filled 2020-03-14: qty 2

## 2020-03-14 NOTE — Progress Notes (Signed)
Inpatient Diabetes Program Recommendations  AACE/ADA: New Consensus Statement on Inpatient Glycemic Control (2015)  Target Ranges:  Prepandial:   less than 140 mg/dL      Peak postprandial:   less than 180 mg/dL (1-2 hours)      Critically ill patients:  140 - 180 mg/dL    Results for NASSIR, NEIDERT (MRN [id number]) as of 03/14/2020 13:31  Ref. Range 03/14/2020 08:38 03/14/2020 13:00  Glucose-Capillary Latest Ref Range: 70 - 99 mg/dL 78 91     Home DM Meds: Levemir 16 units QHS       Novolog [place] (uses app in his phone to help him calculate correction and meal time boluses)   Current Orders: Levemir 18 units QHS     Novolog Sensitive Correction Scale/ SSI (0-9 units) TID AC + HS     Novolog 5 units TID     MD- Note patient received 18 units Levemir last PM.  CBG only 78 this AM  Please consider slight reduction of Levemir to 16 units QHS    --Will follow patient during hospitalization--  Wyn Quaker RN, MSN, CDE Diabetes Coordinator Inpatient Glycemic Control Team Team Pager: [phone] (8a-5p)

## 2020-03-14 NOTE — Progress Notes (Signed)
Patient is nauseated this AM, Zofran given. No other changes from previously documented assessment. Madlyn Frankel, RN

## 2020-03-14 NOTE — Progress Notes (Signed)
PROGRESS NOTE    KIELAN DREISBACH   TIR:[id number]  DOB: [birth year]  PCP: Derinda Late, MD    DOA: 03/12/2020 LOS: 2   Brief Narrative   Leonard Johnston is a 55 y.o. male with medical history significant of hypertension, hyperlipidemia, type 1 diabetes, GERD, anxiety, CKD stage IIIa, who presented to the ED on 03/12/20 with shortness of breath, nausea, vomiting, diarrhea, generalized weakness, poor appetite x over 5 days.  Positive Covid-19  PCR.  Found to be in DKA with anion gap 27, bicarb 14, glucose 501, ketonuria, elevate beta-hydroxybutyrate, pH 7.24 on VBG.  Renal function with AKI on CKD stage 3a with creatinie 1.83 (baseline ~1.4).  Mild leukocytosis on labs.  Chest xray unremarkable.  No hypoxia, afebrile, stable vitals.   Treated with insulin drip in the ED, gap closed prior to time of admission.  Admitted to med/surg for further management.    Started on remdesivir and supportive care for Covid-19 infection.       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   --Continue remdesivir --No hypoxia, defer steroids --Follow inflammatory markers, CMP, CBC --Mucinex, vitamins, zinc, bronchodilator --O2 if needed to keep spO2 at or above 88%   DKA - POA, off insulin drip before admitted to floor.   --Titrate insulin for inpatient goal 140-180 --Reduce Levemir 18>>16 units qHS --Novolog 5 units TID WC + sliding scale   AKI superimposed on CKD stage 3a - due to dehydration with DKA.  Improved with IVF's. --monitor BMP --avoid nephrotoxins and hypotension --renally dose  meds   Hypertension - chronic, stable.  Not on antihypertensives outpatient.  PRN hydralazine.  Hyperlipidemia - Not on medication. PCP follow up.  Uncontrolled Type 1 diabetes - Takes Novolog and Levemir at home, uses continuous glucose monitor. --Levemir and Novolog per orders --Adjust insulin as needed  --A1c pending  Nausea, vomiting, diarrhea - resolved.  Likely due to Covid-19 and DKA.  Monitor. Tolerating diet. --Covid tx as above --PRN Imodium, Zofran   Insomnia - trial of trazodone qHS     Patient BMI: Body mass index is 19.82 kg/m.   DVT prophylaxis: enoxaparin (LOVENOX) injection 40 mg Start: 03/12/20 2200   Diet:  Diet Orders (From admission, onward)    Start     Ordered   03/12/20 1007  Diet Carb Modified Fluid consistency: Thin; Room service appropriate? Yes  Diet effective now       Comments: COVID +  Question Answer Comment  Diet-HS Snack? Nothing   Calorie Level Medium 1600-2000   Fluid consistency: Thin   Room service appropriate? Yes      03/12/20 1006            Code Status: Full Code    Subjective 03/14/20    Pt not feeling well today.  Reports nausea.  Was not able to sleep, requests sleep aid tonight.  Feels more run down today and general malaise.   Disposition Plan & Communication   Status is: Inpatient  Remains inpatient appropriate because:IV treatments appropriate due to intensity of illness or inability to take PO   Dispo:  The patient is from: Home              Anticipated d/c is to: Home              Anticipated d/c date is: 1 day              Patient currently is not medically stable to d/c.    Consults, Procedures, Significant Events   Consultants:   None  Procedures:   None  Antimicrobials:  Anti-infectives (From admission, onward)   Start     Dose/Rate Route Frequency Ordered Stop   03/13/20 1000  remdesivir 100 mg in sodium chloride 0.9 % 100 mL IVPB       "Followed by" Linked Group Details   100  mg 200 mL/hr over 30 Minutes Intravenous Daily 03/12/20 0827 03/14/20 1143   03/12/20 0930  remdesivir 200 mg in sodium chloride 0.9% 250 mL IVPB       "Followed by" Linked Group Details   200 mg 580 mL/hr over 30 Minutes Intravenous Once 03/12/20 0827 03/12/20 1050         Objective   Vitals:   03/13/20 1543 03/13/20 2027 03/14/20 0442 03/14/20 0926  BP: (!) 143/87 119/82 (!) 141/93 (!) 147/97  Pulse: 77 86 79 79  Resp: 20 20 18 16   Temp: 97.8 F (36.6 C) 98.4 F (36.9 C) 98.6 F (37 C) 98.2 F (36.8 C)  TempSrc:  Oral  Oral  SpO2: 95% 94% 98% 97%  Weight:      Height:        Intake/Output Summary (Last 24 hours) at 03/14/2020 1623 Last data filed at 03/13/2020 1712 Gross per 24 hour  Intake 104.74 ml  Output --  Net 104.74 ml   Filed Weights   03/12/20 0139  Weight: 83.9 kg    Physical Exam:  General exam: awake, alert, no acute distress HEENT: moist mucus membranes, hearing grossly normal  Respiratory system: symmetric chest rise, normal respiratory effort, on room air Cardiovascular system: RRR, no JVD, no pedal edema.   Central nervous system: A&O x4. no gross focal neurologic deficits, normal speech Extremities: moves all, no edema, normal tone   Labs   Data Reviewed: I have personally reviewed following labs and imaging studies  CBC: Recent Labs  Lab 03/12/20 0149 03/13/20 0505 03/14/20 0714  WBC 11.3* 4.5 3.5*  NEUTROABS  --  2.9 2.0  HGB 15.4 13.7 14.2  HCT 47.6 40.8 41.0  MCV 94.8 91.5 89.9  PLT 140* 104* 123XX123*   Basic Metabolic Panel: Recent Labs  Lab 03/12/20 0149 03/12/20 0335 03/12/20 0729 03/13/20 0505 03/14/20 0714  NA 134* 133* 134* 138 140  K 4.9 4.9 4.2 4.7 3.4*  CL 93* 94* 99 101 100  CO2 14* 17* 23 29 32  GLUCOSE 501* 418* 228* 306* 85  BUN 41* 41* 37* 33* 29*  CREATININE 1.85* 1.83* 1.27* 1.20 1.06  CALCIUM 9.0 8.9 8.4* 8.9 8.6*  MG  --   --   --  1.9 1.8   GFR: Estimated Creatinine Clearance: 95.6 mL/min (by C-G  formula based on SCr of 1.06 mg/dL). Liver Function Tests: Recent Labs  Lab 03/12/20 0149 03/13/20 0505 03/14/20 0714  AST 23 19 22   ALT 19 11 14   ALKPHOS 78 58 66  BILITOT 2.4* 0.6 0.5  PROT 7.5 5.9* 6.3*  ALBUMIN 3.9 3.0* 3.1*   No results for input(s): LIPASE, AMYLASE in the last 168 hours. No results for input(s):  AMMONIA in the last 168 hours. Coagulation Profile: No results for input(s): INR, PROTIME in the last 168 hours. Cardiac Enzymes: No results for input(s): CKTOTAL, CKMB, CKMBINDEX, TROPONINI in the last 168 hours. BNP (last 3 results) No results for input(s): PROBNP in the last 8760 hours. HbA1C: Recent Labs    03/14/20 0714  HGBA1C 9.1*   CBG: Recent Labs  Lab 03/13/20 1158 03/13/20 1542 03/13/20 2125 03/14/20 0838 03/14/20 1300  GLUCAP 310* 127* 213* 78 91   Lipid Profile: Recent Labs    03/12/20 1009  TRIG 235*   Thyroid Function Tests: No results for input(s): TSH, T4TOTAL, FREET4, T3FREE, THYROIDAB in the last 72 hours. Anemia Panel: Recent Labs    03/13/20 0505 03/14/20 0714  FERRITIN 240 179   Sepsis Labs: Recent Labs  Lab 03/12/20 1009  PROCALCITON 0.36    Recent Results (from the past 240 hour(s))  Resp Panel by RT-PCR (Flu A&B, Covid)     Status: Abnormal   Collection Time: 03/12/20  3:35 AM   Specimen: Nasopharyngeal(NP) swabs in vial transport medium  Result Value Ref Range Status   SARS Coronavirus 2 by RT PCR POSITIVE (A) NEGATIVE Final    Comment: RESULT CALLED TO, READ BACK BY AND VERIFIED WITH: [facility] AT [id number] 03/12/20.PMF (NOTE) SARS-CoV-2 target nucleic acids are DETECTED.  The SARS-CoV-2 RNA is generally detectable in upper respiratory specimens during the acute phase of infection. Positive results are indicative of the presence of the identified virus, but do not rule out bacterial infection or co-infection with other pathogens not detected by the test. Clinical correlation with patient history  and other diagnostic information is necessary to determine patient infection status. The expected result is Negative.  Fact Sheet for Patients: EntrepreneurPulse.com.au  Fact Sheet for Healthcare Providers: IncredibleEmployment.be  This test is not yet approved or cleared by the [place] FDA and  has been authorized for detection and/or diagnosis of SARS-CoV-2 by FDA under an Emergency Use Authorization (EUA).  This EUA will remain in effect (meaning this test can  be used) for the duration of  the COVID-19 declaration under Section 564(b)(1) of the Act, 21 U.S.C. section 360bbb-3(b)(1), unless the authorization is terminated or revoked sooner.     Influenza A by PCR NEGATIVE NEGATIVE Final   Influenza B by PCR NEGATIVE NEGATIVE Final    Comment: (NOTE) The Xpert Xpress SARS-CoV-2/FLU/RSV plus assay is intended as an aid in the diagnosis of influenza from Nasopharyngeal swab specimens and should not be used as a sole basis for treatment. Nasal washings and aspirates are unacceptable for Xpert Xpress SARS-CoV-2/FLU/RSV testing.  Fact Sheet for Patients: EntrepreneurPulse.com.au  Fact Sheet for Healthcare Providers: IncredibleEmployment.be  This test is not yet approved or cleared by the [place] FDA and has been authorized for detection and/or diagnosis of SARS-CoV-2 by FDA under an Emergency Use Authorization (EUA). This EUA will remain in effect (meaning this test can be used) for the duration of the COVID-19 declaration under Section 564(b)(1) of the Act, 21 U.S.C. section 360bbb-3(b)(1), unless the authorization is terminated or revoked.  Performed at [facility], [address] [place], [place] [address]   Culture, blood (Routine X 2) w Reflex to ID Panel     Status: None (Preliminary result)   Collection Time: 03/12/20 10:09 AM   Specimen: BLOOD  Result Value Ref Range  Status   Specimen Description BLOOD BLOOD RIGHT FOREARM  Final   Special Requests   Final  BOTTLES DRAWN AEROBIC AND ANAEROBIC Blood Culture results may not be optimal due to an excessive volume of blood received in culture bottles   Culture   Final    NO GROWTH 2 DAYS Performed at [facility], [address] [place], [place] [address]    Report Status PENDING  Incomplete  Culture, blood (Routine X 2) w Reflex to ID Panel     Status: None (Preliminary result)   Collection Time: 03/12/20 10:10 AM   Specimen: BLOOD  Result Value Ref Range Status   Specimen Description BLOOD BLOOD LEFT FOREARM  Final   Special Requests   Final    BOTTLES DRAWN AEROBIC AND ANAEROBIC Blood Culture results may not be optimal due to an excessive volume of blood received in culture bottles   Culture   Final    NO GROWTH 2 DAYS Performed at [facility], [address], [place], [place] [address]    Report Status PENDING  Incomplete      Imaging Studies   No results found.   Medications   Scheduled Meds: . vitamin C  500 mg Oral Daily  . enoxaparin (LOVENOX) injection  40 mg Subcutaneous Q24H  . insulin aspart  0-5 Units Subcutaneous QHS  . insulin aspart  0-9 Units Subcutaneous TID WC  . insulin aspart  5 Units Subcutaneous TID WC  . insulin detemir  18 Units Subcutaneous QHS  . melatonin  5 mg Oral QHS  . multivitamin with minerals  1 tablet Oral Daily  . pantoprazole  40 mg Oral Daily  . potassium chloride  40 mEq Oral Once  . traZODone  50 mg Oral QHS  . zinc sulfate  220 mg Oral Daily   Continuous Infusions:      LOS: 2 days    Time spent: 25 minutes with >50% spent at bedside and in coordination of care.    Ezekiel Slocumb, DO [facility]  03/14/2020, 4:23 PM    If 7PM-7AM, please contact night-coverage. How to contact the [facility] Attending or Consulting provider [place] or covering provider during after hours [place], for this patient?     1. Check the care team in [facility] and look for a) attending/consulting [facility] provider listed and b) the [facility] team listed 2. Log into [email or web address] and use [place]'s universal password to access. If you do not have the password, please contact the hospital operator. 3. Locate the [facility] provider you are looking for under [facility] and page to a number that you can be directly reached. 4. If you still have difficulty reaching the provider, please page the [facility] (Director on Call) for the Hospitalists listed on amion for assistance.

## 2020-03-15 LAB — COMPREHENSIVE METABOLIC PANEL
ALT: 16 U/L (ref 0–44)
AST: 24 U/L (ref 15–41)
Albumin: 3.1 g/dL — ABNORMAL LOW (ref 3.5–5.0)
Alkaline Phosphatase: 72 U/L (ref 38–126)
Anion gap: 10 (ref 5–15)
BUN: 25 mg/dL — ABNORMAL HIGH (ref 6–20)
CO2: 33 mmol/L — ABNORMAL HIGH (ref 22–32)
Calcium: 8.9 mg/dL (ref 8.9–10.3)
Chloride: 97 mmol/L — ABNORMAL LOW (ref 98–111)
Creatinine, Ser: 1.11 mg/dL (ref 0.61–1.24)
GFR, Estimated: >60 mL/min (ref >60)
Glucose, Bld: 204 mg/dL — ABNORMAL HIGH (ref 70–99)
Potassium: 4.9 mmol/L (ref 3.5–5.1)
Sodium: 140 mmol/L (ref 135–145)
Total Bilirubin: 0.7 mg/dL (ref 0.3–1.2)
Total Protein: 6.2 g/dL — ABNORMAL LOW (ref 6.5–8.1)

## 2020-03-15 LAB — CBC WITH DIFFERENTIAL/PLATELET
Abs Immature Granulocytes: 0.01 10*3/uL (ref 0.00–0.07)
Basophils Absolute: 0 10*3/uL (ref 0.0–0.1)
Basophils Relative: 0 %
Eosinophils Absolute: 0 10*3/uL (ref 0.0–0.5)
Eosinophils Relative: 1 %
HCT: 42 % (ref 39.0–52.0)
Hemoglobin: 14 g/dL (ref 13.0–17.0)
Immature Granulocytes: 0 %
Lymphocytes Relative: 35 %
Lymphs Abs: 1.2 10*3/uL (ref 0.7–4.0)
MCH: 30.2 pg (ref 26.0–34.0)
MCHC: 33.3 g/dL (ref 30.0–36.0)
MCV: 90.5 fL (ref 80.0–100.0)
Monocytes Absolute: 0.4 10*3/uL (ref 0.1–1.0)
Monocytes Relative: 11 %
Neutro Abs: 1.8 10*3/uL (ref 1.7–7.7)
Neutrophils Relative %: 53 %
Platelets: 105 10*3/uL — ABNORMAL LOW (ref 150–400)
RBC: 4.64 MIL/uL (ref 4.22–5.81)
RDW: 13.1 % (ref 11.5–15.5)
WBC: 3.4 10*3/uL — ABNORMAL LOW (ref 4.0–10.5)
nRBC: 0 % (ref 0.0–0.2)

## 2020-03-15 LAB — MAGNESIUM: Magnesium: 1.8 mg/dL (ref 1.7–2.4)

## 2020-03-15 LAB — C-REACTIVE PROTEIN: CRP: 1.9 mg/dL — ABNORMAL HIGH (ref <1.0)

## 2020-03-15 LAB — FERRITIN: Ferritin: 181 ng/mL (ref 24–336)

## 2020-03-15 LAB — GLUCOSE, CAPILLARY
Glucose-Capillary: 104 mg/dL — ABNORMAL HIGH (ref 70–99)
Glucose-Capillary: 108 mg/dL — ABNORMAL HIGH (ref 70–99)

## 2020-03-15 LAB — FIBRIN DERIVATIVES D-DIMER (ARMC ONLY): Fibrin derivatives D-dimer (ARMC): 443.75 ng/mL (FEU) (ref 0.00–499.00)

## 2020-03-15 MED ORDER — TRAMADOL HCL 50 MG PO TABS
50.0000 mg | ORAL_TABLET | Freq: Four times a day (QID) | ORAL | Status: DC | PRN
  Administered 2020-03-15: 50 mg via ORAL
  Filled 2020-03-15: qty 1

## 2020-03-15 NOTE — Discharge Summary (Signed)
Physician Discharge Summary  Leonard Johnston [id number] DOB: 03-06-1965 DOA: 03/12/2020  PCP: Derinda Late, MD  Admit date: 03/12/2020 Discharge date: 03/15/2020  Admitted From: home Disposition:  home  Recommendations for Outpatient Follow-up:  1. Follow up with PCP in 1-2 weeks 2. 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 male with medical history significant of hypertension, hyperlipidemia, type 1 diabetes, GERD, anxiety, CKD stage IIIa, who presented to the ED on 03/12/20 with shortness of breath, nausea, vomiting, diarrhea, generalized weakness, poor appetite x over 5 days.  Positive Covid-19  PCR.  Found to be in DKA with anion gap 27, bicarb 14, glucose 501, ketonuria, elevate beta-hydroxybutyrate, pH 7.24 on VBG.  Renal function with AKI on CKD stage 3a with creatinie 1.83 (baseline ~1.4).  Mild leukocytosis on labs.  Chest xray unremarkable.  No hypoxia, afebrile, stable vitals.   Treated with insulin drip in the ED, gap closed prior to time of admission.  Admitted to med/surg for further management.    Started on remdesivir and supportive care for Covid-19 infection.      Covid-19 Infection - POA with sx's as outlined above and positive Covid PCR.  Treated with remdesivir.  No hypoxia, steroids were deferred.  Supportive care with Mucinex, vitamins, zinc, bronchodilator.  Symptoms improving, clinically stable for discharge home with  outpatient follow up.  DKA - POA, resolved with insulin drip,  Prior to being admitted to floor.  Transitioned back to Levemir and Novolog.    AKI superimposed on CKD stage 3a - due to dehydration with DKA.  Improved with IVF's  Hypertension - chronic, stable.  Not on antihypertensives outpatient.  PRN hydralazine.  Hyperlipidemia - Not on medication. PCP follow up.  Uncontrolled Type 1 diabetes - Continue Novolog and Levemir, uses continuous glucose monitor.  Nausea, vomiting, diarrhea - resolved.  Likely due to Covid-19 and DKA.  Resolved, tolerating diet.  Treated with PRN Imodium and Zofran.   Insomnia - trazodone qHS well tolerated during admission   Discharge Instructions   Discharge Instructions    Call MD for:   Complete by: As directed    Uncontrolled blood sugars   Call MD for:  extreme fatigue   Complete by: As directed    Call MD for:  persistant dizziness or light-headedness   Complete by: As directed    Call MD for:  persistant nausea and vomiting   Complete by: As directed    Call MD for:  severe uncontrolled pain   Complete by: As directed    Call MD for:  temperature >100.4   Complete by: As directed    Diet - low sodium heart healthy   Complete by: As directed    Increase activity slowly   Complete by: As directed      Allergies as of 03/15/2020   No Known Allergies     Medication List    TAKE these medications   BD Pen Needle Micro U/F 32G X 6  MM Misc Generic drug: Insulin Pen Needle 4 (four) times daily. use as directed   Dexcom G6 Receiver [place] Use as directed. [place] [phone]   Dexcom G6 Sensor Misc Use 1 Device every 10 (ten) days NDC [phone]. Run through Medicare partB   Dexcom G6 Transmitter Misc Use as directed, every 3 months. [place] [phone]. Run through Medicare partB   insulin aspart 100 UNIT/ML FlexPen Commonly known as: NOVOLOG Inject into the skin 3 (three) times daily with meals. Per sliding scale on smart  phone.   insulin detemir 100 UNIT/ML injection Commonly known as: LEVEMIR Inject 16 Units into the skin at bedtime.   Multi-Vitamins Tabs Take by mouth.   omeprazole 20 MG capsule Commonly known as: PRILOSEC Take 20 mg by mouth daily.       No Known Allergies  Consultations:  none   Procedures/Studies: DG Chest 2 View  Result Date: 03/12/2020 CLINICAL DATA:  Shortness of breath EXAM: CHEST - 2 VIEW COMPARISON:  03/02/2014 FINDINGS: The heart size and mediastinal contours are within normal limits. Both lungs are clear. The visualized skeletal structures are unremarkable. IMPRESSION: No active cardiopulmonary disease. Electronically Signed   By: Ulyses Jarred M.D.   On: 03/12/2020 02:22       Subjective: Pt feels better today. Nausea improved, fatigue much better.  Feels ready to go home.  No acute complaints.   Discharge Exam: Vitals:   03/15/20 0506 03/15/20 0734  BP: 125/83 127/84  Pulse: 80 79  Resp: 18 16  Temp: 98 F (36.7 C) 98 F (36.7 C)  SpO2: 97% 97%   Vitals:   03/14/20 1701 03/14/20 1931 03/15/20 0506 03/15/20 0734  BP: (!) 146/87 (!) 143/97 125/83 127/84  Pulse: 74 88 80 79  Resp: 20 18 18 16   Temp: 98 F (36.7 C) 98.6 F (37 C) 98 F (36.7 C) 98 F (36.7 C)  TempSrc: Oral  Oral   SpO2: 96% 100% 97% 97%  Weight:      Height:        General: Pt is alert, awake, not in acute distress Cardiovascular: RRR, S1/S2 +, no rubs, no gallops Respiratory: CTA bilaterally, no wheezing, no rhonchi Abdominal: Soft, NT, ND, bowel sounds + Extremities: no edema, no cyanosis    The results of significant diagnostics from this hospitalization (including imaging, microbiology, ancillary and laboratory) are listed below for reference.     Microbiology: Recent Results (from the past 240 hour(s))  Resp Panel by RT-PCR (Flu A&B, Covid)     Status: Abnormal   Collection Time: 03/12/20  3:35 AM   Specimen: Nasopharyngeal(NP) swabs in vial transport  medium  Result Value Ref Range Status   SARS Coronavirus 2 by RT PCR POSITIVE (A) NEGATIVE Final    Comment: RESULT CALLED TO, READ BACK BY AND VERIFIED WITH: [facility] WALLACE AT [id number] 03/12/20.PMF (NOTE) SARS-CoV-2 target nucleic acids are DETECTED.  The SARS-CoV-2 RNA is generally detectable in upper respiratory specimens during the acute phase of infection. Positive results are indicative of the presence of the identified virus, but do not rule out bacterial infection or co-infection with other pathogens not detected by the test. Clinical correlation with patient history and other diagnostic information is necessary to determine patient infection status. The expected result is Negative.  Fact Sheet for Patients: EntrepreneurPulse.com.au  Fact Sheet for Healthcare Providers: IncredibleEmployment.be  This test is not yet approved or cleared by the [place] FDA and  has been authorized for detection and/or diagnosis of SARS-CoV-2  by FDA under an Emergency Use Authorization (EUA).  This EUA will remain in effect (meaning this test can  be used) for the duration of  the COVID-19 declaration under Section 564(b)(1) of the Act, 21 U.S.C. section 360bbb-3(b)(1), unless the authorization is terminated or revoked sooner.     Influenza A by PCR NEGATIVE NEGATIVE Final   Influenza B by PCR NEGATIVE NEGATIVE Final    Comment: (NOTE) The Xpert Xpress SARS-CoV-2/FLU/RSV plus assay is intended as an aid in the diagnosis of influenza from Nasopharyngeal swab specimens and should not be used as a sole basis for treatment. Nasal washings and aspirates are unacceptable for Xpert Xpress SARS-CoV-2/FLU/RSV testing.  Fact Sheet for Patients: EntrepreneurPulse.com.au  Fact Sheet for Healthcare Providers: IncredibleEmployment.be  This test is not yet approved or cleared by the [place] FDA and has been authorized  for detection and/or diagnosis of SARS-CoV-2 by FDA under an Emergency Use Authorization (EUA). This EUA will remain in effect (meaning this test can be used) for the duration of the COVID-19 declaration under Section 564(b)(1) of the Act, 21 U.S.C. section 360bbb-3(b)(1), unless the authorization is terminated or revoked.  Performed at [facility], [address] [place], [place] [address]   Culture, blood (Routine X 2) w Reflex to ID Panel     Status: None (Preliminary result)   Collection Time: 03/12/20 10:09 AM   Specimen: BLOOD  Result Value Ref Range Status   Specimen Description BLOOD BLOOD RIGHT FOREARM  Final   Special Requests   Final    BOTTLES DRAWN AEROBIC AND ANAEROBIC Blood Culture results may not be optimal due to an excessive volume of blood received in culture bottles   Culture   Final    NO GROWTH 3 DAYS Performed at [facility], [address] [place], [place] [address]    Report Status PENDING  Incomplete  Culture, blood (Routine X 2) w Reflex to ID Panel     Status: None (Preliminary result)   Collection Time: 03/12/20 10:10 AM   Specimen: BLOOD  Result Value Ref Range Status   Specimen Description BLOOD BLOOD LEFT FOREARM  Final   Special Requests   Final    BOTTLES DRAWN AEROBIC AND ANAEROBIC Blood Culture results may not be optimal due to an excessive volume of blood received in culture bottles   Culture   Final    NO GROWTH 3 DAYS Performed at [facility], [address] [place], [place] [address]    Report Status PENDING  Incomplete     Labs: BNP (last 3 results) Recent Labs    03/12/20 1009  BNP [id number]   Basic Metabolic Panel: Recent Labs  Lab 03/12/20 0335 03/12/20 0729 03/13/20 0505 03/14/20 0714 03/15/20 0437  NA 133* 134* 138 140 140  K 4.9 4.2 4.7 3.4* 4.9  CL 94* 99 101 100 97*  CO2 17* 23 29 32 33*  GLUCOSE 418* 228* 306* 85 204*  BUN 41* 37* 33* 29* 25*  CREATININE 1.83* 1.27* 1.20 1.06 1.11   CALCIUM 8.9 8.4* 8.9 8.6* 8.9  MG  --   --  1.9 1.8 1.8   Liver Function Tests: Recent Labs  Lab 03/12/20 0149 03/13/20 0505 03/14/20 0714 03/15/20 0437  AST 23 19 22 24   ALT 19 11 14 16   ALKPHOS 78 58 66 72  BILITOT 2.4* 0.6 0.5 0.7  PROT 7.5 5.9* 6.3* 6.2*  ALBUMIN 3.9 3.0* 3.1* 3.1*   No results for input(s):  LIPASE, AMYLASE in the last 168 hours. No results for input(s): AMMONIA in the last 168 hours. CBC: Recent Labs  Lab 03/12/20 0149 03/13/20 0505 03/14/20 0714 03/15/20 0437  WBC 11.3* 4.5 3.5* 3.4*  NEUTROABS  --  2.9 2.0 1.8  HGB 15.4 13.7 14.2 14.0  HCT 47.6 40.8 41.0 42.0  MCV 94.8 91.5 89.9 90.5  PLT 140* 104* 108* 105*   Cardiac Enzymes: No results for input(s): CKTOTAL, CKMB, CKMBINDEX, TROPONINI in the last 168 hours. BNP: Invalid input(s): POCBNP CBG: Recent Labs  Lab 03/14/20 0838 03/14/20 1300 03/14/20 1650 03/14/20 2237 03/15/20 0752  GLUCAP 78 91 171* 200* 104*   D-Dimer No results for input(s): DDIMER in the last 72 hours. Hgb A1c Recent Labs    03/14/20 0714  HGBA1C 9.1*   Lipid Profile No results for input(s): CHOL, HDL, LDLCALC, TRIG, CHOLHDL, LDLDIRECT in the last 72 hours. Thyroid function studies No results for input(s): TSH, T4TOTAL, T3FREE, THYROIDAB in the last 72 hours.  Invalid input(s): FREET3 Anemia work up Recent Labs    03/14/20 0714 03/15/20 0437  FERRITIN 179 181   Urinalysis    Component Value Date/Time   COLORURINE YELLOW (A) 03/12/2020 0335   APPEARANCEUR HAZY (A) 03/12/2020 0335   APPEARANCEUR Clear 03/02/2014 1943   LABSPEC 1.023 03/12/2020 0335   LABSPEC 1.017 03/02/2014 1943   PHURINE 5.0 03/12/2020 0335   GLUCOSEU >=500 (A) 03/12/2020 0335   GLUCOSEU >=500 03/02/2014 1943   HGBUR SMALL (A) 03/12/2020 0335   BILIRUBINUR NEGATIVE 03/12/2020 0335   BILIRUBINUR Negative 03/02/2014 1943   KETONESUR 80 (A) 03/12/2020 0335   PROTEINUR NEGATIVE 03/12/2020 0335   NITRITE NEGATIVE 03/12/2020 0335    LEUKOCYTESUR NEGATIVE 03/12/2020 0335   LEUKOCYTESUR Negative 03/02/2014 1943   Sepsis Labs Invalid input(s): PROCALCITONIN,  WBC,  LACTICIDVEN Microbiology Recent Results (from the past 240 hour(s))  Resp Panel by RT-PCR (Flu A&B, Covid)     Status: Abnormal   Collection Time: 03/12/20  3:35 AM   Specimen: Nasopharyngeal(NP) swabs in vial transport medium  Result Value Ref Range Status   SARS Coronavirus 2 by RT PCR POSITIVE (A) NEGATIVE Final    Comment: RESULT CALLED TO, READ BACK BY AND VERIFIED WITH: [facility] WALLACE AT [id number] 03/12/20.PMF (NOTE) SARS-CoV-2 target nucleic acids are DETECTED.  The SARS-CoV-2 RNA is generally detectable in upper respiratory specimens during the acute phase of infection. Positive results are indicative of the presence of the identified virus, but do not rule out bacterial infection or co-infection with other pathogens not detected by the test. Clinical correlation with patient history and other diagnostic information is necessary to determine patient infection status. The expected result is Negative.  Fact Sheet for Patients: EntrepreneurPulse.com.au  Fact Sheet for Healthcare Providers: IncredibleEmployment.be  This test is not yet approved or cleared by the [place] FDA and  has been authorized for detection and/or diagnosis of SARS-CoV-2 by FDA under an Emergency Use Authorization (EUA).  This EUA will remain in effect (meaning this test can  be used) for the duration of  the COVID-19 declaration under Section 564(b)(1) of the Act, 21 U.S.C. section 360bbb-3(b)(1), unless the authorization is terminated or revoked sooner.     Influenza A by PCR NEGATIVE NEGATIVE Final   Influenza B by PCR NEGATIVE NEGATIVE Final    Comment: (NOTE) The Xpert Xpress SARS-CoV-2/FLU/RSV plus assay is intended as an aid in the diagnosis of influenza from Nasopharyngeal swab specimens and should not be used as a sole  basis for treatment. Nasal washings and aspirates are unacceptable for Xpert Xpress SARS-CoV-2/FLU/RSV testing.  Fact Sheet for Patients: EntrepreneurPulse.com.au  Fact Sheet for Healthcare Providers: IncredibleEmployment.be  This test is not yet approved or cleared by the [place] FDA and has been authorized for detection and/or diagnosis of SARS-CoV-2 by FDA under an Emergency Use Authorization (EUA). This EUA will remain in effect (meaning this test can be used) for the duration of the COVID-19 declaration under Section 564(b)(1) of the Act, 21 U.S.C. section 360bbb-3(b)(1), unless the authorization is terminated or revoked.  Performed at [facility], [address] [place], [place] [address]   Culture, blood (Routine X 2) w Reflex to ID Panel     Status: None (Preliminary result)   Collection Time: 03/12/20 10:09 AM   Specimen: BLOOD  Result Value Ref Range Status   Specimen Description BLOOD BLOOD RIGHT FOREARM  Final   Special Requests   Final    BOTTLES DRAWN AEROBIC AND ANAEROBIC Blood Culture results may not be optimal due to an excessive volume of blood received in culture bottles   Culture   Final    NO GROWTH 3 DAYS Performed at [facility], [address] [place], [place] [address]    Report Status PENDING  Incomplete  Culture, blood (Routine X 2) w Reflex to ID Panel     Status: None (Preliminary result)   Collection Time: 03/12/20 10:10 AM   Specimen: BLOOD  Result Value Ref Range Status   Specimen Description BLOOD BLOOD LEFT FOREARM  Final   Special Requests   Final    BOTTLES DRAWN AEROBIC AND ANAEROBIC Blood Culture results may not be optimal due to an excessive volume of blood received in culture bottles   Culture   Final    NO GROWTH 3 DAYS Performed at [facility], [address] [place], [place] [address]    Report Status PENDING  Incomplete     Time coordinating  discharge: Over 30 minutes  SIGNED:   Ezekiel Slocumb, DO [facility] 03/15/2020, 10:25 AM   If 7PM-7AM, please contact night-coverage [email or web address]

## 2020-03-15 NOTE — Care Management Important Message (Signed)
Important Message  Patient Details  Name: Leonard Johnston MRN: [id number] Date of Birth: Sep 10, 1965   Medicare Important Message Given:  Yes     Shelbie Hutching, RN 03/15/2020, 10:28 AM

## 2020-03-17 LAB — CULTURE, BLOOD (ROUTINE X 2)
Culture: NO GROWTH
Culture: NO GROWTH

## 2020-03-29 DIAGNOSIS — E782 Mixed hyperlipidemia: Secondary | ICD-10-CM | POA: Insufficient documentation

## 2020-08-29 DIAGNOSIS — N183 Chronic kidney disease, stage 3 unspecified: Secondary | ICD-10-CM | POA: Insufficient documentation

## 2020-09-27 ENCOUNTER — Emergency Department: Payer: Medicare HMO

## 2020-09-27 ENCOUNTER — Inpatient Hospital Stay: Payer: Medicare HMO

## 2020-09-27 ENCOUNTER — Observation Stay
Admission: EM | Admit: 2020-09-27 | Discharge: 2020-09-30 | Disposition: A | Discharge status: Home / Self Care | Payer: Medicare HMO | Attending: Hospitalist | Admitting: Hospitalist

## 2020-09-27 ENCOUNTER — Other Ambulatory Visit: Payer: Self-pay

## 2020-09-27 DIAGNOSIS — Z794 Long term (current) use of insulin: Secondary | ICD-10-CM | POA: Insufficient documentation

## 2020-09-27 DIAGNOSIS — L089 Local infection of the skin and subcutaneous tissue, unspecified: Secondary | ICD-10-CM

## 2020-09-27 DIAGNOSIS — K219 Gastro-esophageal reflux disease without esophagitis: Secondary | ICD-10-CM | POA: Diagnosis present

## 2020-09-27 DIAGNOSIS — I129 Hypertensive chronic kidney disease with stage 1 through stage 4 chronic kidney disease, or unspecified chronic kidney disease: Secondary | ICD-10-CM | POA: Insufficient documentation

## 2020-09-27 DIAGNOSIS — Z87891 Personal history of nicotine dependence: Secondary | ICD-10-CM | POA: Diagnosis not present

## 2020-09-27 DIAGNOSIS — E11628 Type 2 diabetes mellitus with other skin complications: Secondary | ICD-10-CM | POA: Diagnosis not present

## 2020-09-27 DIAGNOSIS — L97529 Non-pressure chronic ulcer of other part of left foot with unspecified severity: Secondary | ICD-10-CM | POA: Insufficient documentation

## 2020-09-27 DIAGNOSIS — Z79899 Other long term (current) drug therapy: Secondary | ICD-10-CM | POA: Diagnosis not present

## 2020-09-27 DIAGNOSIS — Z20822 Contact with and (suspected) exposure to covid-19: Secondary | ICD-10-CM | POA: Diagnosis not present

## 2020-09-27 DIAGNOSIS — IMO0002 Reserved for concepts with insufficient information to code with codable children: Secondary | ICD-10-CM | POA: Diagnosis present

## 2020-09-27 DIAGNOSIS — E10621 Type 1 diabetes mellitus with foot ulcer: Principal | ICD-10-CM | POA: Insufficient documentation

## 2020-09-27 DIAGNOSIS — E11319 Type 2 diabetes mellitus with unspecified diabetic retinopathy without macular edema: Secondary | ICD-10-CM | POA: Diagnosis present

## 2020-09-27 DIAGNOSIS — E1039 Type 1 diabetes mellitus with other diabetic ophthalmic complication: Secondary | ICD-10-CM | POA: Diagnosis present

## 2020-09-27 DIAGNOSIS — I10 Essential (primary) hypertension: Secondary | ICD-10-CM | POA: Diagnosis present

## 2020-09-27 DIAGNOSIS — M79672 Pain in left foot: Secondary | ICD-10-CM | POA: Diagnosis present

## 2020-09-27 DIAGNOSIS — E109 Type 1 diabetes mellitus without complications: Secondary | ICD-10-CM | POA: Diagnosis present

## 2020-09-27 DIAGNOSIS — E1022 Type 1 diabetes mellitus with diabetic chronic kidney disease: Secondary | ICD-10-CM | POA: Diagnosis not present

## 2020-09-27 DIAGNOSIS — Z8616 Personal history of COVID-19: Secondary | ICD-10-CM | POA: Diagnosis not present

## 2020-09-27 DIAGNOSIS — L97519 Non-pressure chronic ulcer of other part of right foot with unspecified severity: Secondary | ICD-10-CM | POA: Insufficient documentation

## 2020-09-27 DIAGNOSIS — N1831 Chronic kidney disease, stage 3a: Secondary | ICD-10-CM | POA: Diagnosis not present

## 2020-09-27 DIAGNOSIS — E1065 Type 1 diabetes mellitus with hyperglycemia: Secondary | ICD-10-CM

## 2020-09-27 LAB — PROTIME-INR
INR: 1 (ref 0.8–1.2)
Prothrombin Time: 13.2 seconds (ref 11.4–15.2)

## 2020-09-27 LAB — CBC WITH DIFFERENTIAL/PLATELET
Abs Immature Granulocytes: 0.04 10*3/uL (ref 0.00–0.07)
Basophils Absolute: 0 10*3/uL (ref 0.0–0.1)
Basophils Relative: 0 %
Eosinophils Absolute: 0 10*3/uL (ref 0.0–0.5)
Eosinophils Relative: 0 %
HCT: 40.9 % (ref 39.0–52.0)
Hemoglobin: 13.8 g/dL (ref 13.0–17.0)
Immature Granulocytes: 0 %
Lymphocytes Relative: 9 %
Lymphs Abs: 0.9 10*3/uL (ref 0.7–4.0)
MCH: 31.8 pg (ref 26.0–34.0)
MCHC: 33.7 g/dL (ref 30.0–36.0)
MCV: 94.2 fL (ref 80.0–100.0)
Monocytes Absolute: 0.6 10*3/uL (ref 0.1–1.0)
Monocytes Relative: 6 %
Neutro Abs: 8.5 10*3/uL — ABNORMAL HIGH (ref 1.7–7.7)
Neutrophils Relative %: 85 %
Platelets: 211 10*3/uL (ref 150–400)
RBC: 4.34 MIL/uL (ref 4.22–5.81)
RDW: 13.8 % (ref 11.5–15.5)
WBC: 10.1 10*3/uL (ref 4.0–10.5)
nRBC: 0 % (ref 0.0–0.2)

## 2020-09-27 LAB — COMPREHENSIVE METABOLIC PANEL
ALT: 14 U/L (ref 0–44)
AST: 14 U/L — ABNORMAL LOW (ref 15–41)
Albumin: 3.4 g/dL — ABNORMAL LOW (ref 3.5–5.0)
Alkaline Phosphatase: 76 U/L (ref 38–126)
Anion gap: 8 (ref 5–15)
BUN: 28 mg/dL — ABNORMAL HIGH (ref 6–20)
CO2: 26 mmol/L (ref 22–32)
Calcium: 8.9 mg/dL (ref 8.9–10.3)
Chloride: 102 mmol/L (ref 98–111)
Creatinine, Ser: 1.15 mg/dL (ref 0.61–1.24)
GFR, Estimated: >60 mL/min (ref >60)
Glucose, Bld: 316 mg/dL — ABNORMAL HIGH (ref 70–99)
Potassium: 4.1 mmol/L (ref 3.5–5.1)
Sodium: 136 mmol/L (ref 135–145)
Total Bilirubin: 0.7 mg/dL (ref 0.3–1.2)
Total Protein: 7.2 g/dL (ref 6.5–8.1)

## 2020-09-27 LAB — GLUCOSE, CAPILLARY: Glucose-Capillary: 361 mg/dL — ABNORMAL HIGH (ref 70–99)

## 2020-09-27 LAB — CBG MONITORING, ED: Glucose-Capillary: 242 mg/dL — ABNORMAL HIGH (ref 70–99)

## 2020-09-27 LAB — URINALYSIS, COMPLETE (UACMP) WITH MICROSCOPIC
Bilirubin Urine: NEGATIVE
Glucose, UA: >=500 mg/dL — AB
Ketones, ur: 5 mg/dL — AB
Leukocytes,Ua: NEGATIVE
Nitrite: NEGATIVE
Protein, ur: NEGATIVE mg/dL
Specific Gravity, Urine: 1.025 (ref 1.005–1.030)
Squamous Epithelial / HPF: NONE SEEN (ref 0–5)
pH: 5 (ref 5.0–8.0)

## 2020-09-27 LAB — RESP PANEL BY RT-PCR (FLU A&B, COVID) ARPGX2
Influenza A by PCR: NEGATIVE
Influenza B by PCR: NEGATIVE
SARS Coronavirus 2 by RT PCR: NEGATIVE

## 2020-09-27 LAB — SEDIMENTATION RATE: Sed Rate: 41 mm/hr — ABNORMAL HIGH (ref 0–20)

## 2020-09-27 LAB — APTT: aPTT: 35 seconds (ref 24–36)

## 2020-09-27 LAB — LACTIC ACID, PLASMA
Lactic Acid, Venous: 1.1 mmol/L (ref 0.5–1.9)
Lactic Acid, Venous: 1 mmol/L (ref 0.5–1.9)

## 2020-09-27 LAB — C-REACTIVE PROTEIN: CRP: 6.1 mg/dL — ABNORMAL HIGH (ref <1.0)

## 2020-09-27 LAB — URIC ACID: Uric Acid, Serum: 3.3 mg/dL — ABNORMAL LOW (ref 3.7–8.6)

## 2020-09-27 MED ORDER — INSULIN ASPART 100 UNIT/ML IJ SOLN
0.0000 [IU] | Freq: Three times a day (TID) | INTRAMUSCULAR | Status: DC
  Administered 2020-09-27: 3 [IU] via SUBCUTANEOUS
  Administered 2020-09-28 (×2): 7 [IU] via SUBCUTANEOUS
  Administered 2020-09-28: 3 [IU] via SUBCUTANEOUS
  Administered 2020-09-29 (×2): 5 [IU] via SUBCUTANEOUS
  Administered 2020-09-29: 3 [IU] via SUBCUTANEOUS
  Administered 2020-09-30: 2 [IU] via SUBCUTANEOUS
  Administered 2020-09-30: 7 [IU] via SUBCUTANEOUS
  Filled 2020-09-27 (×9): qty 1

## 2020-09-27 MED ORDER — VANCOMYCIN HCL IN DEXTROSE 1-5 GM/200ML-% IV SOLN
1000.0000 mg | Freq: Two times a day (BID) | INTRAVENOUS | Status: DC
  Administered 2020-09-28 – 2020-09-30 (×5): 1000 mg via INTRAVENOUS
  Filled 2020-09-27 (×7): qty 200

## 2020-09-27 MED ORDER — OCUVITE-LUTEIN PO CAPS
1.0000 | ORAL_CAPSULE | Freq: Every day | ORAL | Status: DC
  Administered 2020-09-28 – 2020-09-30 (×3): 1 via ORAL
  Filled 2020-09-27 (×3): qty 1

## 2020-09-27 MED ORDER — BASAGLAR KWIKPEN 100 UNIT/ML ~~LOC~~ SOPN: 15.0000 [IU] | PEN_INJECTOR | Freq: Every day | SUBCUTANEOUS | Status: DC

## 2020-09-27 MED ORDER — MORPHINE SULFATE (PF) 2 MG/ML IV SOLN: 2.0000 mg | INTRAVENOUS | Status: DC | PRN

## 2020-09-27 MED ORDER — ACETAMINOPHEN 160 MG/5ML PO SOLN
650.0000 mg | Freq: Four times a day (QID) | ORAL | Status: DC | PRN
  Filled 2020-09-27: qty 20.3

## 2020-09-27 MED ORDER — SODIUM CHLORIDE 0.9 % IV SOLN: 1.0000 g | INTRAVENOUS | Status: DC

## 2020-09-27 MED ORDER — HEPARIN SODIUM (PORCINE) 5000 UNIT/ML IJ SOLN
5000.0000 [IU] | Freq: Three times a day (TID) | INTRAMUSCULAR | Status: DC
  Administered 2020-09-27 – 2020-09-28 (×4): 5000 [IU] via SUBCUTANEOUS
  Filled 2020-09-27 (×4): qty 1

## 2020-09-27 MED ORDER — VANCOMYCIN HCL 2000 MG/400ML IV SOLN
2000.0000 mg | Freq: Once | INTRAVENOUS | Status: AC
  Administered 2020-09-27: 2000 mg via INTRAVENOUS
  Filled 2020-09-27: qty 400

## 2020-09-27 MED ORDER — INSULIN ASPART 100 UNIT/ML IJ SOLN
0.0000 [IU] | Freq: Every day | INTRAMUSCULAR | Status: DC
Start: 2020-09-27 — End: 2020-09-30
  Administered 2020-09-27: 5 [IU] via SUBCUTANEOUS
  Administered 2020-09-28: 3 [IU] via SUBCUTANEOUS
  Filled 2020-09-27 (×2): qty 1

## 2020-09-27 MED ORDER — HYDRALAZINE HCL 20 MG/ML IJ SOLN: 5.0000 mg | INTRAMUSCULAR | Status: DC | PRN

## 2020-09-27 MED ORDER — ONDANSETRON HCL 4 MG/2ML IJ SOLN: 4.0000 mg | Freq: Three times a day (TID) | INTRAMUSCULAR | Status: DC | PRN

## 2020-09-27 MED ORDER — OXYCODONE-ACETAMINOPHEN 5-325 MG PO TABS
1.0000 | ORAL_TABLET | ORAL | Status: DC | PRN
  Administered 2020-09-27 – 2020-09-29 (×9): 1 via ORAL
  Filled 2020-09-27 (×9): qty 1

## 2020-09-27 MED ORDER — PIPERACILLIN-TAZOBACTAM 3.375 G IVPB 30 MIN
3.3750 g | Freq: Once | INTRAVENOUS | Status: AC
  Administered 2020-09-27: 3.375 g via INTRAVENOUS
  Filled 2020-09-27: qty 50

## 2020-09-27 MED ORDER — PIPERACILLIN-TAZOBACTAM 4.5 G IVPB
4.5000 g | Freq: Once | INTRAVENOUS | Status: DC
  Filled 2020-09-27: qty 100

## 2020-09-27 MED ORDER — FAMOTIDINE 20 MG PO TABS: 40.0000 mg | ORAL_TABLET | Freq: Every day | ORAL | Status: DC | PRN

## 2020-09-27 MED ORDER — SODIUM CHLORIDE 0.9 % IV BOLUS
1000.0000 mL | Freq: Once | INTRAVENOUS | Status: AC
  Administered 2020-09-27: 1000 mL via INTRAVENOUS

## 2020-09-27 MED ORDER — ATORVASTATIN CALCIUM 20 MG PO TABS
40.0000 mg | ORAL_TABLET | Freq: Every day | ORAL | Status: DC
  Administered 2020-09-27 – 2020-09-29 (×3): 40 mg via ORAL
  Filled 2020-09-27 (×3): qty 2

## 2020-09-27 MED ORDER — INSULIN GLARGINE-YFGN 100 UNIT/ML ~~LOC~~ SOLN
15.0000 [IU] | Freq: Every day | SUBCUTANEOUS | Status: DC
  Administered 2020-09-27: 15 [IU] via SUBCUTANEOUS
  Filled 2020-09-27 (×2): qty 0.15

## 2020-09-27 MED ORDER — GADOBUTROL 1 MMOL/ML IV SOLN
7.0000 mL | Freq: Once | INTRAVENOUS | Status: AC | PRN
  Administered 2020-09-27: 7 mL via INTRAVENOUS

## 2020-09-27 MED ORDER — SODIUM CHLORIDE 0.9 % IV SOLN
2.0000 g | INTRAVENOUS | Status: DC
  Administered 2020-09-27 – 2020-09-29 (×3): 2 g via INTRAVENOUS
  Filled 2020-09-27 (×4): qty 20

## 2020-09-27 MED ORDER — ADULT MULTIVITAMIN W/MINERALS CH
1.0000 | ORAL_TABLET | Freq: Every day | ORAL | Status: DC
  Administered 2020-09-28 – 2020-09-29 (×2): 1 via ORAL
  Filled 2020-09-27 (×2): qty 1

## 2020-09-27 NOTE — Consult Note (Signed)
[facility] Nurse Consult Note: Reason for Consult:Bilateral neuropathic foot ulcerations. Podiatry has been consulted by Dr. Blaine Hamper.  I will place interim orders for Nursing. See also photos in EMR. Wound type:Neuropathic, L>R.  Left is at 1st metatarsal head, left is at 4th met head. Pressure Injury POA: N/A Measurement:To be obtained by bedside RN Wound bed:red, moist surrounded by callus Drainage (amount, consistency, odor) small Periwound: intact with erythema, callus Dressing procedure/placement/frequency: I have provided Nursing with interim orders to wash feet, dry, dress with xeroform gauze, top with gauze and secure with Kerlix.  Podiatric Medicine Orders will supercede mine.  [facility] nursing team will not follow, but will remain available to this patient, the nursing and medical teams.  Please re-consult if needed. Thanks, Maudie Flakes, MSN, RN, [place], Arther Abbott  Pager# [phone]

## 2020-09-27 NOTE — Consult Note (Signed)
Pharmacy Antibiotic Note  Leonard Johnston is a 55 y.o. male admitted on 09/27/2020 with diabetic foot infection. Pharmacy has been consulted for Vancomycin dosing.  Plan: Vancomycin 2000 mg LD given in ED Initiate Vancomycin 1000 mg Q12H. Goal AUC 400-550 Estimated AUC 508 Scr used: 1.15  Estimated Cmin 14.4 Monitor renal function   Height: '6\' 8"'$  (203.2 cm) Weight: 77.1 kg (170 lb) IBW/kg (Calculated) : 96  Temp (24hrs), Avg:98.6 F (37 C), Min:98.6 F (37 C), Max:98.6 F (37 C)  Recent Labs  Lab 09/27/20 1243 09/27/20 1444  WBC 10.1  --   CREATININE 1.15  --   LATICACIDVEN 1.1 1.0    Estimated Creatinine Clearance: 80.1 mL/min (by C-G formula based on SCr of 1.15 mg/dL).    No Known Allergies  Antimicrobials this admission: 8/2 Zosyn >> x1 8/2 ceftriaxone >>  8/2 Vancomycin >>  Dose adjustments this admission: N/a  Microbiology results: 8/2 BCx: sent   Thank you for allowing pharmacy to be a part of this patient's care.  Dorothe Pea, PharmD, BCPS Clinical Pharmacist   09/27/2020 4:47 PM

## 2020-09-27 NOTE — H&P (Signed)
History and Physical    Leonard Johnston:[id number] DOB: [birth year] DOA: 09/27/2020  Referring MD/NP/PA:   PCP: Derinda Late, MD   Patient coming from:  The patient is coming from home.  At baseline, pt is independent for most of ADL.        Chief Complaint: diabetic foot ulcer with infection  HPI: Leonard Johnston is a 55 y.o. male with medical history significant of type 1 diabetes, hypertension, hyperlipidemia, GERD, anxiety, osteomyelitis of the foot, COVID infection, DKA, who presents with diabetic foot ulcer with infection.  Pt states that he went to the beach recently. He was walking on the hot sands and developed blisters on the bottoms of both his feet. Pt put medicated cream on them. The blister initially began to heal, but he noticed pus drainage left foot ulcer in the past several days.  Although he has peripheral neuropathy, he has severe pain in both feet, worse on the left foot.  The pain is constant, sharp, 10 out of 10 in severity, nonradiating.  Patient does not have fever or chills.  No chest pain, shortness breath, cough.  Patient has nausea, no vomiting, diarrhea or abdominal pain.  No symptoms of UTI.  ED Course: pt was found to have WBC 10.1, lactic acid 1.1, 1.0, INR 1.0, negative urinalysis, pending COVID-19 PCR, uric acid 3.3, electrolytes renal function okay, temperature normal, blood pressure 149/90, heart rate 103, RR 18, oxygen saturation 97% on room air.  Patient is admitted to [facility] [facility] bed as inpatient.  Dr. Amalia Hailey of podiatry is consulted.  X-ray of left foot: 1. Central erosion of the base of the proximal phalanx great toe, along with scalloped and somewhat irregular head of the first metatarsal bulging into this erosion. The appearance is well corticated and I would tend to favor gout arthropathy or rheumatoid arthritis over septic arthritis. 2. Probably old fractures of the proximal phalanges of the third and fourth toes. 3. Prior amputation of the second  toe at the level of the proximal metaphysis of the proximal phalanx.  Review of Systems:   General: no fevers, chills, no body weight gain, has fatigue HEENT: no blurry vision, hearing changes or sore throat Respiratory: no dyspnea, coughing, wheezing CV: no chest pain, no palpitations GI: has nausea, no vomiting, abdominal pain, diarrhea, constipation GU: no dysuria, burning on urination, increased urinary frequency, hematuria  Ext: no leg edema. Has foot ulcer bilaterally Neuro: no unilateral weakness, numbness, or tingling, no vision change or hearing loss Skin: no rash, no skin tear. MSK: No muscle spasm, no deformity, no limitation of range of movement in spin Heme: No easy bruising.  Travel history: No recent long distant travel.  Allergy: No Known Allergies  Past Medical History:  Diagnosis Date   Anxiety    Diabetes mellitus type I ([place])    Diabetes mellitus without complication ([place])    type 1   GERD (gastroesophageal reflux disease)    Hypercholesteremia     Past Surgical History:  Procedure Laterality Date   amputaion of toe     COLONOSCOPY WITH PROPOFOL N/A 06/24/2017   Procedure: COLONOSCOPY WITH PROPOFOL;  Surgeon: Manya Silvas, MD;  Location: [facility] ENDOSCOPY;  Service: Endoscopy;  Laterality: N/A;   ESOPHAGOGASTRODUODENOSCOPY (EGD) WITH PROPOFOL N/A 06/24/2017   Procedure: ESOPHAGOGASTRODUODENOSCOPY (EGD) WITH PROPOFOL;  Surgeon: Manya Silvas, MD;  Location: [facility] ENDOSCOPY;  Service: Endoscopy;  Laterality: N/A;   EYE SURGERY     TOE SURGERY  Social History:  reports that he quit smoking about 11 years ago. He has never used smokeless tobacco. He reports that he does not drink alcohol and does not use drugs.  Family History:  Family History  Problem Relation Age of Onset   Alcohol abuse Sister    Drug abuse Sister    Alcohol abuse Sister    Drug abuse Sister      Prior to Admission medications   Medication Sig Start Date End Date Taking?  Authorizing Provider  BD PEN NEEDLE MICRO U/F 32G X 6 MM MISC 4 (four) times daily. use as directed 12/16/17   [provider]  Continuous Blood Gluc Receiver (DEXCOM G6 RECEIVER) DEVI Use as directed. [facility] [id number] 12/26/17   [provider]  Continuous Blood Gluc Sensor (DEXCOM G6 SENSOR) MISC Use 1 Device every 10 (ten) days NDC [id number]. Run through Medicare partB 01/01/18   [provider]  Continuous Blood Gluc Transmit (DEXCOM G6 TRANSMITTER) MISC Use as directed, every 3 months. [place] [phone]. Run through Medicare partB 01/01/18   [provider]  insulin aspart (NOVOLOG) 100 UNIT/ML FlexPen Inject into the skin 3 (three) times daily with meals. Per sliding scale on smart phone.    [provider]  insulin detemir (LEVEMIR) 100 UNIT/ML injection Inject 16 Units into the skin at bedtime.    [provider]  Multiple Vitamin (MULTI-VITAMINS) TABS Take by mouth.    [provider]  omeprazole (PRILOSEC) 20 MG capsule Take 20 mg by mouth daily.    [provider]  prazosin (MINIPRESS) 5 MG capsule Take 1 capsule (5 mg total) by mouth at bedtime. For nightmares 01/22/18 10/18/19  Ursula Alert, MD  propranolol (INDERAL) 10 MG tablet Take 1 tablet (10 mg total) by mouth 3 (three) times daily as needed. Only for severe agitation/anxiety 01/22/18 10/18/19  Ursula Alert, MD  ranitidine (ZANTAC) 75 MG tablet Take 75 mg by mouth 2 (two) times daily.  10/18/19  [provider]  sertraline (ZOLOFT) 50 MG tablet Take 1.5 tablets (75 mg total) by mouth daily. 01/22/18 10/18/19  Ursula Alert, MD  traZODone (DESYREL) 50 MG tablet TAKE 1/2 TO 1 (ONE-HALF TO ONE) TABLET BY MOUTH AT BEDTIME AS NEEDED FOR SLEEP 01/22/18 10/18/19  Ursula Alert, MD    Physical Exam: Vitals:   09/27/20 1447 09/27/20 1500 09/27/20 1720 09/27/20 1806  BP: (!) 159/78 (!) 149/90 (!) 146/82 139/78  Pulse: 96 93 90 91  Resp: 18 18 18 16    Temp:   98.9 F (37.2 C) 99.2 F (37.3 C)  TempSrc:   Oral Oral  SpO2: 98% 97% 98% 98%  Weight:      Height:       General: Not in acute distress HEENT:       Eyes: PERRL, EOMI, no scleral icterus.       ENT: No discharge from the ears and nose, no pharynx injection, no tonsillar enlargement.        Neck: No JVD, no bruit, no mass felt. Heme: No neck lymph node enlargement. Cardiac: S1/S2, RRR, No murmurs, No gallops or rubs. Respiratory: No rales, wheezing, rhonchi or rubs. GI: Soft, nondistended, nontender, no rebound pain, no organomegaly, BS present. GU: No hematuria Ext: No pitting leg edema bilaterally. 1+DP/PT pulse bilaterally. Musculoskeletal: No joint deformities, No joint redness or warmth, no limitation of ROM in spin. Skin: Has plantar ulcer in both feet, with little pus drainage from left foot ulcer  Neuro: Alert, oriented X3, cranial nerves II-XII grossly intact, moves all extremities normally.  Psych: Patient is not psychotic, no suicidal or hemocidal ideation.  Labs on Admission: I have personally reviewed following labs and imaging studies  CBC: Recent Labs  Lab 09/27/20 1243  WBC 10.1  NEUTROABS 8.5*  HGB 13.8  HCT 40.9  MCV 94.2  PLT 454   Basic Metabolic Panel: Recent Labs  Lab 09/27/20 1243  NA 136  K 4.1  CL 102  CO2 26  GLUCOSE 316*  BUN 28*  CREATININE 1.15  CALCIUM 8.9   GFR: Estimated Creatinine Clearance: 80.1 mL/min (by C-G formula based on SCr of 1.15 mg/dL). Liver Function Tests: Recent Labs  Lab 09/27/20 1243  AST 14*  ALT 14  ALKPHOS 76  BILITOT 0.7  PROT 7.2  ALBUMIN 3.4*   No results for input(s): LIPASE, AMYLASE in the last 168 hours. No results for input(s): AMMONIA in the last 168 hours. Coagulation Profile: Recent Labs  Lab 09/27/20 1243  INR 1.0   Cardiac Enzymes: No results for input(s): CKTOTAL, CKMB, CKMBINDEX, TROPONINI in the last 168 hours. BNP (last 3 results) No results for input(s):  PROBNP in the last 8760 hours. HbA1C: No results for input(s): HGBA1C in the last 72 hours. CBG: Recent Labs  Lab 09/27/20 1721  GLUCAP 242*   Lipid Profile: No results for input(s): CHOL, HDL, LDLCALC, TRIG, CHOLHDL, LDLDIRECT in the last 72 hours. Thyroid Function Tests: No results for input(s): TSH, T4TOTAL, FREET4, T3FREE, THYROIDAB in the last 72 hours. Anemia Panel: No results for input(s): VITAMINB12, FOLATE, FERRITIN, TIBC, IRON, RETICCTPCT in the last 72 hours. Urine analysis:    Component Value Date/Time   COLORURINE YELLOW (A) 09/27/2020 1444   APPEARANCEUR CLEAR (A) 09/27/2020 1444   APPEARANCEUR Clear 03/02/2014 1943   LABSPEC 1.025 09/27/2020 1444   LABSPEC 1.017 03/02/2014 1943   PHURINE 5.0 09/27/2020 1444   GLUCOSEU >=500 (A) 09/27/2020 1444   GLUCOSEU >=500 03/02/2014 1943   HGBUR SMALL (A) 09/27/2020 1444   BILIRUBINUR NEGATIVE 09/27/2020 1444   BILIRUBINUR Negative 03/02/2014 1943   KETONESUR 5 (A) 09/27/2020 1444   PROTEINUR NEGATIVE 09/27/2020 1444   NITRITE NEGATIVE 09/27/2020 1444   LEUKOCYTESUR NEGATIVE 09/27/2020 1444   LEUKOCYTESUR Negative 03/02/2014 1943   Sepsis Labs: @LABRCNTIP (procalcitonin:4,lacticidven:4) ) Recent Results (from the past 240 hour(s))  Resp Panel by RT-PCR (Flu A&B, Covid) Nasopharyngeal Swab     Status: None   Collection Time: 09/27/20  4:51 PM   Specimen: Nasopharyngeal Swab; Nasopharyngeal(NP) swabs in vial transport medium  Result Value Ref Range Status   SARS Coronavirus 2 by RT PCR NEGATIVE NEGATIVE Final    Comment: (NOTE) SARS-CoV-2 target nucleic acids are NOT DETECTED.  The SARS-CoV-2 RNA is generally detectable in upper respiratory specimens during the acute phase of infection. The lowest concentration of SARS-CoV-2 viral copies this assay can detect is 138 copies/mL. A negative result does not preclude SARS-Cov-2 infection and should not be used as the sole basis for treatment or other patient management  decisions. A negative result may occur with  improper specimen collection/handling, submission of specimen other than nasopharyngeal swab, presence of viral mutation(s) within the areas targeted by this assay, and inadequate number of viral copies(<138 copies/mL). A negative result must be combined with clinical observations, patient history, and epidemiological information. The expected result is Negative.  Fact Sheet for Patients:  EntrepreneurPulse.com.au  Fact Sheet for Healthcare Providers:  IncredibleEmployment.be  This test is no t yet  approved or cleared by the [place] and  has been authorized for detection and/or diagnosis of SARS-CoV-2 by FDA under an Emergency Use Authorization (EUA). This EUA will remain  in effect (meaning this test can be used) for the duration of the COVID-19 declaration under Section 564(b)(1) of the Act, 21 U.S.C.section 360bbb-3(b)(1), unless the authorization is terminated  or revoked sooner.       Influenza A by PCR NEGATIVE NEGATIVE Final   Influenza B by PCR NEGATIVE NEGATIVE Final    Comment: (NOTE) The Xpert Xpress SARS-CoV-2/FLU/RSV plus assay is intended as an aid in the diagnosis of influenza from Nasopharyngeal swab specimens and should not be used as a sole basis for treatment. Nasal washings and aspirates are unacceptable for Xpert Xpress SARS-CoV-2/FLU/RSV testing.  Fact Sheet for Patients: EntrepreneurPulse.com.au  Fact Sheet for Healthcare Providers: IncredibleEmployment.be  This test is not yet approved or cleared by the [place] FDA and has been authorized for detection and/or diagnosis of SARS-CoV-2 by FDA under an Emergency Use Authorization (EUA). This EUA will remain in effect (meaning this test can be used) for the duration of the COVID-19 declaration under Section 564(b)(1) of the Act, 21 U.S.C. section 360bbb-3(b)(1), unless the  authorization is terminated or revoked.  Performed at [facility] [address] [place], [place] [address]      Radiological Exams on Admission: DG Foot Complete Left  Result Date: 09/27/2020 CLINICAL DATA:  Foot injury.  Blistering.  Diabetes. EXAM: LEFT FOOT - COMPLETE 3+ VIEW COMPARISON:  None. FINDINGS: Amputation of the second toe at the level of the proximal metaphysis of the proximal phalanx. Well corticated distal margin. Central erosion of the base of the proximal phalanx great toe with irregular head of the first metatarsal bulging into the resulting concavity. Mild sclerosis. Sclerosis and deformity in the shaft of the proximal phalanx fourth toe suspicious for a prior fracture, but age indeterminate. There is likewise some deformity along the distal metaphysis of the proximal phalanx third toe likely from a prior fracture. No bony destructive findings are identified. IMPRESSION: 1. Central erosion of the base of the proximal phalanx great toe, along with scalloped and somewhat irregular head of the first metatarsal bulging into this erosion. The appearance is well corticated and I would tend to favor gout arthropathy or rheumatoid arthritis over septic arthritis. 2. Probably old fractures of the proximal phalanges of the third and fourth toes. 3. Prior amputation of the second toe at the level of the proximal metaphysis of the proximal phalanx. Electronically Signed   By: Van Clines M.D.   On: 09/27/2020 13:49     EKG:  Not done in ED, will get one.   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  Does not meet criteria for sepsis.  Currently hemodynamically stable. Consulted Dr. Amalia Hailey of  podiatry. X-ray of left foot findings are suggestive for gout per radiologist, but uric acid is normal 3.3.  Will follow-up podiatrist's recommendations.  - Admitted to MedSurg bed as inpatient - Empiric antimicrobial treatment with vancomycin and Rocephin - PRN Zofran for nausea, morphine and Percocet for pain - Blood cultures x 2  - ESR and CRP - wound care consult - MRI-left  foot - IVF: 2.0 L of LR -  INR/PTT  Essential hypertension: Patient's not taking medications currently.  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  Patient taking NovoLog and glargine insulin at home -Sliding scale insulin -Decrease at home glargine insulin dose from 20 to 15 unit daily    DVT ppx: SQ Heparin   Code Status: Full code Family Communication: not done, no family member is at bed side.   Disposition Plan:  Anticipate discharge back to previous environment Consults called:  Dr. Hilaria Ota of podiatry Admission status and Level of care: Med-Surg:   as inpt      Status is: Inpatient  Remains inpatient appropriate because:Inpatient level of care appropriate due to severity of illness  Dispo: The patient is from: Home              Anticipated d/c is to: Home              Patient currently is not medically stable to d/c.   Difficult to place patient No        Date of Service 09/27/2020    Ivor Costa [facility]   If 7PM-7AM, please contact night-coverage [email or web address] 09/27/2020, 6:42 PM

## 2020-09-27 NOTE — Plan of Care (Signed)

## 2020-09-27 NOTE — ED Provider Notes (Signed)
[facility]  ____________________________________________   Event Date/Time   First MD Initiated Contact with Patient 09/27/20 1421     (approximate)  I have reviewed the triage vital signs and the nursing notes.   HISTORY  Chief Complaint Wound Infection    HPI METIN OSWALD is a 55 y.o. male type 1 DM, GERD, HLD who presents with a wound infection.  Patient notes that about a month ago he walked on the hot sand and then had blisters on his bilateral feet.  They were initially healing however he then noticed worsening pain of the left foot wound over the past several days.  He has had chills but no fevers.  Did note significant purulence draining from the wound today.       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 ([place]) [id number]   Proliferative diabetic retinopathy, both eyes ([place]) 06/19/2012   Retinopathy, diabetic, proliferative ([place]) 06/04/2012   Acute osteomyelitis of ankle or foot ([place]) 12/16/2011   Cellulitis of foot 12/12/2011    Past Surgical History:  Procedure Laterality Date   amputaion of toe     COLONOSCOPY WITH PROPOFOL N/A 06/24/2017   Procedure: COLONOSCOPY WITH PROPOFOL;  Surgeon: Manya Silvas, MD;  Location: [facility] ENDOSCOPY;  Service: Endoscopy;  Laterality: N/A;   ESOPHAGOGASTRODUODENOSCOPY (EGD) WITH PROPOFOL N/A 06/24/2017    Procedure: ESOPHAGOGASTRODUODENOSCOPY (EGD) WITH PROPOFOL;  Surgeon: Manya Silvas, MD;  Location: [facility] ENDOSCOPY;  Service: Endoscopy;  Laterality: N/A;   EYE SURGERY     TOE SURGERY      Prior to Admission medications   Medication Sig Start Date End Date Taking? Authorizing Provider  BD PEN NEEDLE MICRO U/F 32G X 6 MM MISC 4 (four) times daily. use as directed 12/16/17   [provider]  Continuous Blood Gluc Receiver (DEXCOM G6 RECEIVER) DEVI Use as directed. [facility] [id number] 12/26/17   [provider]  Continuous Blood Gluc Sensor (DEXCOM G6 SENSOR) MISC Use 1 Device every 10 (ten) days NDC [phone]. Run through Medicare partB 01/01/18   [provider]  Continuous Blood Gluc Transmit (DEXCOM G6 TRANSMITTER) MISC Use as directed, every 3 months. [place] [phone]. Run through Medicare partB 01/01/18   [provider]  insulin aspart (NOVOLOG) 100 UNIT/ML FlexPen Inject into the skin 3 (three) times daily with meals. Per sliding scale on smart phone.    [provider]  insulin detemir (LEVEMIR) 100 UNIT/ML injection Inject 16 Units into the skin at bedtime.    [provider]  Multiple Vitamin (MULTI-VITAMINS) TABS Take by mouth.    [provider]  omeprazole (PRILOSEC) 20 MG capsule Take 20 mg by mouth daily.    [provider]  prazosin (MINIPRESS) 5 MG capsule Take 1 capsule (5 mg total) by mouth at bedtime. For nightmares 01/22/18 10/18/19  Ursula Alert, MD  propranolol (INDERAL) 10 MG tablet  Take 1 tablet (10 mg total) by mouth 3 (three) times daily as needed. Only for severe agitation/anxiety 01/22/18 10/18/19  Ursula Alert, MD  ranitidine (ZANTAC) 75 MG tablet Take 75 mg by mouth 2 (two) times daily.  10/18/19  [provider]  sertraline (ZOLOFT) 50 MG tablet Take 1.5 tablets (75 mg total) by mouth daily. 01/22/18 10/18/19  Ursula Alert, MD  traZODone (DESYREL) 50 MG tablet TAKE 1/2 TO 1  (ONE-HALF TO ONE) TABLET BY MOUTH AT BEDTIME AS NEEDED FOR SLEEP 01/22/18 10/18/19  Ursula Alert, MD    Allergies Patient has no known allergies.  Family History  Problem Relation Age of Onset   Alcohol abuse Sister    Drug abuse Sister    Alcohol abuse Sister    Drug abuse Sister     Social History Social History   Tobacco Use   Smoking status: Former    Types: Cigarettes    Quit date: 06/24/2009    Years since quitting: 11.2   Smokeless tobacco: Never  Vaping Use   Vaping Use: Never used  Substance Use Topics   Alcohol use: No   Drug use: No    Review of Systems   Review of Systems  Constitutional:  Positive for chills. Negative for fever.  Musculoskeletal:  Positive for arthralgias and joint swelling.  Skin:  Positive for wound.  Neurological:  Negative for light-headedness and headaches.  All other systems reviewed and are negative.  Physical Exam Updated Vital Signs BP (!) 149/90   Pulse 93   Temp 98.6 F (37 C) (Oral)   Resp 18   Ht '6\' 8"'$  (2.032 m)   Wt 77.1 kg   SpO2 97%   BMI 18.68 kg/m   Physical Exam Vitals and nursing note reviewed.  Constitutional:      General: He is not in acute distress.    Appearance: Normal appearance.  HENT:     Head: Normocephalic and atraumatic.  Eyes:     General: No scleral icterus.    Conjunctiva/sclera: Conjunctivae normal.  Pulmonary:     Effort: Pulmonary effort is normal. No respiratory distress.     Breath sounds: Normal breath sounds. No wheezing.  Musculoskeletal:        General: No deformity or signs of injury.     Cervical back: Normal range of motion.  Skin:    Coloration: Skin is not jaundiced or pale.     Comments: Left foot wound pictured below, there is purulent drainage and erythema tracking on the medial aspect of the foot up to the ankle with warmth and tenderness  Neurological:     General: No focal deficit present.     Mental Status: He is alert and oriented to person, place, and time.  Mental status is at baseline.  Psychiatric:        Mood and Affect: Mood normal.        Behavior: Behavior normal.      LABS (all labs ordered are listed, but only abnormal results are displayed)  Labs Reviewed  COMPREHENSIVE METABOLIC PANEL - Abnormal; Notable for the following components:      Result Value   Glucose, Bld 316 (*)    BUN 28 (*)    Albumin 3.4 (*)    AST 14 (*)    All other components within normal limits  CBC WITH DIFFERENTIAL/PLATELET - Abnormal; Notable for the following components:   Neutro Abs 8.5 (*)    All other components within normal limits  URINALYSIS, COMPLETE (UACMP) WITH MICROSCOPIC - Abnormal; Notable for the following components:   Color, Urine YELLOW (*)    APPearance CLEAR (*)    Glucose, UA >=500 (*)    Hgb urine dipstick SMALL (*)    Ketones, ur 5 (*)    Bacteria, UA RARE (*)    All other components within normal limits  CULTURE, BLOOD (ROUTINE X 2)  CULTURE, BLOOD (ROUTINE X 2)  LACTIC ACID, PLASMA  LACTIC ACID, PLASMA  PROTIME-INR   ____________________________________________  EKG  N/a ____________________________________________  RADIOLOGY Almeta Monas, personally viewed and evaluated these images (plain radiographs) as part of my medical decision making, as well as reviewing the written report by the radiologist.  ED MD interpretation: I reviewed the left foot x-ray which does not show any gas, per radiology read there is a erosion of the proximal phalanx    ____________________________________________   PROCEDURES  Procedure(s) performed (including Critical Care):  Procedures   ____________________________________________   INITIAL IMPRESSION / ASSESSMENT AND PLAN / ED COURSE  55 year old male who presents with a diabetic foot infection.  On exam he has a ulcer with surrounding cellulitis tracking up the foot.  Has had chills but no other systemic symptoms.  Vital signs within normal limits and labs are  reassuring.  The x-ray of the foot does not show any gas but there is of note a erosion of the proximal phalanx which radiology read as possible gout however in the setting of his infection could also represent osteomyelitis.  We will treat with IV antibiotics and admit.  Will need to be seen by podiatry as well.   ____________________________________________   FINAL CLINICAL IMPRESSION(S) / ED DIAGNOSES  Final diagnoses:  Foot infection     ED Discharge Orders     None        Note:  This document was prepared using Dragon voice recognition software and may include unintentional dictation errors.    Rada Hay, MD 09/27/20 [phone]

## 2020-09-27 NOTE — ED Notes (Signed)
RN informed bed assigned 

## 2020-09-27 NOTE — ED Triage Notes (Addendum)
Pt here with wounds to his bilateral feet. Pt has neuropathy and went to the beach recently and was walking on the sand and developed blisters on the bottoms of both his feet. Pt put medicated cream on them and they began to heal but pt states that he noticed pus coming from the left foot recently. Pt also has DM type 1. Pt took ibuprofen at home.

## 2020-09-27 NOTE — ED Notes (Signed)
1 set of cultures sent to lab

## 2020-09-27 NOTE — Progress Notes (Signed)
Pt admitted to room 154 from ED. Pt oriented to room equipment and call bell in reach. VSS.   09/27/20 1806  Vitals  Temp 99.2 F (37.3 C)  Temp Source Oral  BP 139/78  BP Location Left Arm  BP Method Automatic  Patient Position (if appropriate) Sitting  Pulse Rate 91  Pulse Rate Source Monitor  Resp 16  Level of Consciousness  Level of Consciousness Alert  MEWS COLOR  MEWS Score Color Green  Oxygen Therapy  SpO2 98 %  O2 Device Room Air

## 2020-09-28 ENCOUNTER — Encounter: Payer: Self-pay | Admitting: Internal Medicine

## 2020-09-28 DIAGNOSIS — L089 Local infection of the skin and subcutaneous tissue, unspecified: Secondary | ICD-10-CM

## 2020-09-28 DIAGNOSIS — E11628 Type 2 diabetes mellitus with other skin complications: Secondary | ICD-10-CM

## 2020-09-28 LAB — GLUCOSE, CAPILLARY
Glucose-Capillary: 330 mg/dL — ABNORMAL HIGH (ref 70–99)
Glucose-Capillary: 221 mg/dL — ABNORMAL HIGH (ref 70–99)
Glucose-Capillary: 313 mg/dL — ABNORMAL HIGH (ref 70–99)
Glucose-Capillary: 257 mg/dL — ABNORMAL HIGH (ref 70–99)

## 2020-09-28 LAB — CBC
HCT: 38.9 % — ABNORMAL LOW (ref 39.0–52.0)
Hemoglobin: 13 g/dL (ref 13.0–17.0)
MCH: 31.3 pg (ref 26.0–34.0)
MCHC: 33.4 g/dL (ref 30.0–36.0)
MCV: 93.5 fL (ref 80.0–100.0)
Platelets: 189 10*3/uL (ref 150–400)
RBC: 4.16 MIL/uL — ABNORMAL LOW (ref 4.22–5.81)
RDW: 13.8 % (ref 11.5–15.5)
WBC: 8.1 10*3/uL (ref 4.0–10.5)
nRBC: 0 % (ref 0.0–0.2)

## 2020-09-28 LAB — BASIC METABOLIC PANEL
Anion gap: 4 — ABNORMAL LOW (ref 5–15)
BUN: 23 mg/dL — ABNORMAL HIGH (ref 6–20)
CO2: 31 mmol/L (ref 22–32)
Calcium: 8.7 mg/dL — ABNORMAL LOW (ref 8.9–10.3)
Chloride: 99 mmol/L (ref 98–111)
Creatinine, Ser: 1 mg/dL (ref 0.61–1.24)
GFR, Estimated: >60 mL/min (ref >60)
Glucose, Bld: 370 mg/dL — ABNORMAL HIGH (ref 70–99)
Potassium: 4.3 mmol/L (ref 3.5–5.1)
Sodium: 134 mmol/L — ABNORMAL LOW (ref 135–145)

## 2020-09-28 MED ORDER — INSULIN GLARGINE-YFGN 100 UNIT/ML ~~LOC~~ SOLN
20.0000 [IU] | Freq: Every day | SUBCUTANEOUS | Status: DC
  Administered 2020-09-28 – 2020-09-29 (×2): 20 [IU] via SUBCUTANEOUS
  Filled 2020-09-28 (×3): qty 0.2

## 2020-09-28 MED ORDER — ENOXAPARIN SODIUM 40 MG/0.4ML IJ SOSY
40.0000 mg | PREFILLED_SYRINGE | INTRAMUSCULAR | Status: DC
  Administered 2020-09-28 – 2020-09-29 (×2): 40 mg via SUBCUTANEOUS
  Filled 2020-09-28 (×2): qty 0.4

## 2020-09-28 MED ORDER — MUPIROCIN 2 % EX OINT
1.0000 "application " | TOPICAL_OINTMENT | Freq: Every day | CUTANEOUS | Status: DC
  Administered 2020-09-28 – 2020-09-30 (×3): 1 via TOPICAL
  Filled 2020-09-28: qty 22

## 2020-09-28 MED ORDER — SIMETHICONE 80 MG PO CHEW
160.0000 mg | CHEWABLE_TABLET | Freq: Four times a day (QID) | ORAL | Status: DC | PRN
  Administered 2020-09-28: 160 mg via ORAL
  Filled 2020-09-28 (×2): qty 2

## 2020-09-28 MED ORDER — INSULIN ASPART 100 UNIT/ML IJ SOLN
4.0000 [IU] | Freq: Three times a day (TID) | INTRAMUSCULAR | Status: DC
  Administered 2020-09-29 – 2020-09-30 (×5): 4 [IU] via SUBCUTANEOUS
  Filled 2020-09-28 (×5): qty 1

## 2020-09-28 NOTE — Care Management CC44 (Signed)
Condition Code 44 Documentation Completed  Patient Details  Name: Leonard Johnston MRN: [id number] Date of Birth: 1965/09/03   Condition Code 44 given:  Yes Patient signature on Condition Code 44 notice:  Yes Documentation of 2 MD's agreement:  Yes Code 44 added to claim:  Yes    Su Hilt, RN 09/28/2020, 2:26 PM

## 2020-09-28 NOTE — Progress Notes (Signed)
Inpatient Diabetes Program Recommendations  AACE/ADA: New Consensus Statement on Inpatient Glycemic Control (2015)  Target Ranges:  Prepandial:   less than 140 mg/dL      Peak postprandial:   less than 180 mg/dL (1-2 hours)      Critically ill patients:  140 - 180 mg/dL   Results for Leonard Johnston, Leonard Johnston (MRN [id number]) as of 09/28/2020 09:38  Ref. Range 09/27/2020 17:21 09/27/2020 21:07 09/28/2020 08:15  Glucose-Capillary Latest Ref Range: 70 - 99 mg/dL 242 (H)  3 units NOVOLOG  361 (H)  5 units NOVOLOG  15 units Semglee 330 (H)  7 units NOVOLOG      Admit Diabetic foot infection-bilateral foot ulcers  History: Type 1 Diabetes  Home DM Meds: Basaglar (ENDO notes state Levemir) 20 QHS    Novolog 0-13 TID   Dexcom CGM  Current Orders: Semglee 15 units QHS    Novolog 0-9 units TID ac/hs     ENDO: Dr. Quentin Cornwall visit 08/22/2020--No Changes made to regimen (Levemir 20 units QHS + Novolog 1:36 >100 and Novolog 1:13 grams Carbs)      MD- Note CBG 330 this AM  Please consider:  1. Increase Semglee to 20 units QHS (home dose) Please consider giving an extra 5 units Semglee X 1 dose this AM  2. Start Novolog Meal Coverage: Novolog 4 units TID with meals Hold if pt eats <50% of meal, Hold if pt NPO     --Will follow patient during hospitalization--  Wyn Quaker RN, MSN, CDE Diabetes Coordinator Inpatient Glycemic Control Team Team Pager: [phone] (8a-5p)

## 2020-09-28 NOTE — Care Management Obs Status (Signed)
[facility] NOTIFICATION   Patient Details  Name: Leonard Johnston MRN: [id number] Date of Birth: [birth year]   Medicare Observation Status Notification Given:  Yes    Su Hilt, RN 09/28/2020, 2:26 PM

## 2020-09-28 NOTE — Consult Note (Addendum)
Reason for Consult: Bilateral foot ulcer left leg cellulitis Referring Physician: Enzo Bi, MD  Leonard Johnston is an 55 y.o. male.  HPI: Ulceration developed a few weeks ago he is not sure exactly how it started.  He noticed few days ago that it became swollen red and drainage and it became very painful.  He has a history of osteomyelitis and PICC line for left foot infections in the past.    Past Medical History:  Diagnosis Date   Anxiety    Diabetes mellitus type I ([place])    Diabetes mellitus without complication ([place])    type 1   GERD (gastroesophageal reflux disease)    Hypercholesteremia     Past Surgical History:  Procedure Laterality Date   amputaion of toe     COLONOSCOPY WITH PROPOFOL N/A 06/24/2017   Procedure: COLONOSCOPY WITH PROPOFOL;  Surgeon: Manya Silvas, MD;  Location: [facility] ENDOSCOPY;  Service: Endoscopy;  Laterality: N/A;   ESOPHAGOGASTRODUODENOSCOPY (EGD) WITH PROPOFOL N/A 06/24/2017   Procedure: ESOPHAGOGASTRODUODENOSCOPY (EGD) WITH PROPOFOL;  Surgeon: Manya Silvas, MD;  Location: [facility] ENDOSCOPY;  Service: Endoscopy;  Laterality: N/A;   EYE SURGERY     TOE SURGERY      Family History  Problem Relation Age of Onset   Alcohol abuse Sister    Drug abuse Sister    Alcohol abuse Sister    Drug abuse Sister     Social History:  reports that he quit smoking about 11 years ago. He has never used smokeless tobacco. He reports that he does not drink alcohol and does not use drugs.  Allergies: No Known Allergies  Medications: I have reviewed the patient's current medications.  Results for orders placed or performed during the hospital encounter of 09/27/20 (from the past 48 hour(s))  Comprehensive metabolic panel     Status: Abnormal   Collection Time: 09/27/20 12:43 PM  Result Value Ref Range   Sodium 136 135 - 145 mmol/L   Potassium 4.1 3.5 - 5.1 mmol/L   Chloride 102 98 - 111 mmol/L   CO2 26 22 - 32 mmol/L   Glucose, Bld 316 (H) 70 - 99 mg/dL     Comment: Glucose reference range applies only to samples taken after fasting for at least 8 hours.   BUN 28 (H) 6 - 20 mg/dL   Creatinine, Ser 1.15 0.61 - 1.24 mg/dL   Calcium 8.9 8.9 - 10.3 mg/dL   Total Protein 7.2 6.5 - 8.1 g/dL   Albumin 3.4 (L) 3.5 - 5.0 g/dL   AST 14 (L) 15 - 41 U/L   ALT 14 0 - 44 U/L   Alkaline Phosphatase 76 38 - 126 U/L   Total Bilirubin 0.7 0.3 - 1.2 mg/dL   GFR, Estimated >60 >60 mL/min    Comment: (NOTE) Calculated using the CKD-EPI Creatinine Equation (2021)    Anion gap 8 5 - 15    Comment: Performed at [facility], [address] [place], [place] [address]  Lactic acid, plasma     Status: None   Collection Time: 09/27/20 12:43 PM  Result Value Ref Range   Lactic Acid, Venous 1.1 0.5 - 1.9 mmol/L    Comment: Performed at [facility], [address] [place],  [address]  CBC with Differential     Status: Abnormal   Collection Time: 09/27/20 12:43 PM  Result Value Ref Range   WBC 10.1 4.0 - 10.5 K/uL   RBC 4.34 4.22 -  5.81 MIL/uL   Hemoglobin 13.8 13.0 - 17.0 g/dL   HCT 40.9 39.0 - 52.0 %   MCV 94.2 80.0 - 100.0 fL   MCH 31.8 26.0 - 34.0 pg   MCHC 33.7 30.0 - 36.0 g/dL   RDW 13.8 11.5 - 15.5 %   Platelets 211 150 - 400 K/uL   nRBC 0.0 0.0 - 0.2 %   Neutrophils Relative % 85 %   Neutro Abs 8.5 (H) 1.7 - 7.7 K/uL   Lymphocytes Relative 9 %   Lymphs Abs 0.9 0.7 - 4.0 K/uL   Monocytes Relative 6 %   Monocytes Absolute 0.6 0.1 - 1.0 K/uL   Eosinophils Relative 0 %   Eosinophils Absolute 0.0 0.0 - 0.5 K/uL   Basophils Relative 0 %   Basophils Absolute 0.0 0.0 - 0.1 K/uL   Immature Granulocytes 0 %   Abs Immature Granulocytes 0.04 0.00 - 0.07 K/uL    Comment: Performed at [facility], [address] [place], [place] [address]  Protime-INR     Status: None   Collection Time: 09/27/20 12:43 PM  Result Value Ref Range   Prothrombin Time 13.2 11.4 - 15.2 seconds   INR 1.0 0.8 - 1.2    Comment: (NOTE) INR  goal varies based on device and disease states. Performed at [facility], [address] [place], [place] [address]   Culture, blood (Routine x 2)     Status: None (Preliminary result)   Collection Time: 09/27/20 12:43 PM   Specimen: BLOOD  Result Value Ref Range   Specimen Description BLOOD RIGHT ANTECUBITAL    Special Requests      BOTTLES DRAWN AEROBIC AND ANAEROBIC Blood Culture adequate volume   Culture      NO GROWTH < 24 HOURS Performed at [facility], [address] [place], [place] [address]    Report Status PENDING   Lactic acid, plasma     Status: None   Collection Time: 09/27/20  2:44 PM  Result Value Ref Range   Lactic Acid, Venous 1.0 0.5 - 1.9 mmol/L    Comment: Performed at [facility], [address] [place], [place] [address]  Culture, blood (Routine x 2)     Status: None (Preliminary result)   Collection Time: 09/27/20  2:44 PM   Specimen: BLOOD  Result Value Ref Range   Specimen Description BLOOD RIGHT ANTECUBITAL    Special Requests      BOTTLES DRAWN AEROBIC AND ANAEROBIC Blood Culture adequate volume   Culture      NO GROWTH < 24 HOURS Performed at [facility], [address], [place],  [address]    Report Status PENDING   Urinalysis, Complete w Microscopic Urine, Clean Catch     Status: Abnormal   Collection Time: 09/27/20  2:44 PM  Result Value Ref Range   Color, Urine YELLOW (A) YELLOW   APPearance CLEAR (A) CLEAR   Specific Gravity, Urine 1.025 1.005 - 1.030   pH 5.0 5.0 - 8.0   Glucose, UA >=500 (A) NEGATIVE mg/dL   Hgb urine dipstick SMALL (A) NEGATIVE   Bilirubin Urine NEGATIVE NEGATIVE   Ketones, ur 5 (A) NEGATIVE mg/dL   Protein, ur NEGATIVE NEGATIVE mg/dL   Nitrite NEGATIVE NEGATIVE   Leukocytes,Ua NEGATIVE NEGATIVE   RBC / HPF 0-5 0 - 5 RBC/hpf   WBC, UA 0-5 0 - 5 WBC/hpf   Bacteria, UA RARE (A) NONE SEEN   Squamous Epithelial /  LPF NONE SEEN 0 - 5    Comment: Performed at  [facility], [address] [place], [place] [address]  Uric acid     Status: Abnormal   Collection Time: 09/27/20  2:44 PM  Result Value Ref Range   Uric Acid, Serum 3.3 (L) 3.7 - 8.6 mg/dL    Comment: Performed at [facility], [address] [place], [place] [address]  Resp Panel by RT-PCR (Flu A&B, Covid) Nasopharyngeal Swab     Status: None   Collection Time: 09/27/20  4:51 PM   Specimen: Nasopharyngeal Swab; Nasopharyngeal(NP) swabs in vial transport medium  Result Value Ref Range   SARS Coronavirus 2 by RT PCR NEGATIVE NEGATIVE    Comment: (NOTE) SARS-CoV-2 target nucleic acids are NOT DETECTED.  The SARS-CoV-2 RNA is generally detectable in upper respiratory specimens during the acute phase of infection. The lowest concentration of SARS-CoV-2 viral copies this assay can detect is 138 copies/mL. A negative result does not preclude SARS-Cov-2 infection and should not be used as the sole basis for treatment or other patient management decisions. A negative result may occur with  improper specimen collection/handling, submission of specimen other than nasopharyngeal swab, presence of viral mutation(s) within the areas targeted by this assay, and inadequate number of viral copies(<138 copies/mL). A negative result must be combined with clinical observations, patient history, and epidemiological information. The expected result is Negative.  Fact Sheet for Patients:  EntrepreneurPulse.com.au  Fact Sheet for Healthcare Providers:  IncredibleEmployment.be  This test is no t yet approved or cleared by the [place] FDA and  has been authorized for detection and/or diagnosis of SARS-CoV-2 by FDA under an Emergency Use Authorization (EUA). This EUA will remain  in effect (meaning this test can be used) for the duration of the COVID-19 declaration under Section 564(b)(1) of the Act, 21 U.S.C.section 360bbb-3(b)(1), unless  the authorization is terminated  or revoked sooner.       Influenza A by PCR NEGATIVE NEGATIVE   Influenza B by PCR NEGATIVE NEGATIVE    Comment: (NOTE) The Xpert Xpress SARS-CoV-2/FLU/RSV plus assay is intended as an aid in the diagnosis of influenza from Nasopharyngeal swab specimens and should not be used as a sole basis for treatment. Nasal washings and aspirates are unacceptable for Xpert Xpress SARS-CoV-2/FLU/RSV testing.  Fact Sheet for Patients: EntrepreneurPulse.com.au  Fact Sheet for Healthcare Providers: IncredibleEmployment.be  This test is not yet approved or cleared by the [place] FDA and has been authorized for detection and/or diagnosis of SARS-CoV-2 by FDA under an Emergency Use Authorization (EUA). This EUA will remain in effect (meaning this test can be used) for the duration of the COVID-19 declaration under Section 564(b)(1) of the Act, 21 U.S.C. section 360bbb-3(b)(1), unless the authorization is terminated or revoked.  Performed at [facility], [address] [place], [place] [address]   CBG monitoring, ED     Status: Abnormal   Collection Time: 09/27/20  5:21 PM  Result Value Ref Range   Glucose-Capillary 242 (H) 70 - 99 mg/dL    Comment: Glucose reference range applies only to samples taken after fasting for at least 8 hours.  Sedimentation rate     Status: Abnormal   Collection Time: 09/27/20  6:32 PM  Result Value Ref Range   Sed Rate 41 (H) 0 - 20 mm/hr    Comment: Performed at [facility], [address] [place], [place] [address]  C-reactive protein     Status:  Abnormal   Collection Time: 09/27/20  6:32 PM  Result Value Ref Range   CRP 6.1 (H) <1.0 mg/dL    Comment: Performed at [facility] [address], [place], [place] [address]  APTT     Status: None   Collection Time: 09/27/20  6:32 PM  Result Value Ref Range   aPTT 35 24 - 36 seconds    Comment: Performed at  [facility], [address] [place], [place] [address]  Glucose, capillary     Status: Abnormal   Collection Time: 09/27/20  9:07 PM  Result Value Ref Range   Glucose-Capillary 361 (H) 70 - 99 mg/dL    Comment: Glucose reference range applies only to samples taken after fasting for at least 8 hours.  CBC     Status: Abnormal   Collection Time: 09/28/20  4:37 AM  Result Value Ref Range   WBC 8.1 4.0 - 10.5 K/uL   RBC 4.16 (L) 4.22 - 5.81 MIL/uL   Hemoglobin 13.0 13.0 - 17.0 g/dL   HCT 38.9 (L) 39.0 - 52.0 %   MCV 93.5 80.0 - 100.0 fL   MCH 31.3 26.0 - 34.0 pg   MCHC 33.4 30.0 - 36.0 g/dL   RDW 13.8 11.5 - 15.5 %   Platelets 189 150 - 400 K/uL   nRBC 0.0 0.0 - 0.2 %    Comment: Performed at [facility], [address], [place], [place] [id number]  Basic metabolic panel     Status: Abnormal   Collection Time: 09/28/20  4:37 AM  Result Value Ref Range   Sodium 134 (L) 135 - 145 mmol/L   Potassium 4.3 3.5 - 5.1 mmol/L   Chloride 99 98 - 111 mmol/L   CO2 31 22 - 32 mmol/L   Glucose, Bld 370 (H) 70 - 99 mg/dL    Comment: Glucose reference range applies only to samples taken after fasting for at least 8 hours.   BUN 23 (H) 6 - 20 mg/dL   Creatinine, Ser 1.00 0.61 - 1.24 mg/dL   Calcium 8.7 (L) 8.9 - 10.3 mg/dL   GFR, Estimated >60 >60 mL/min    Comment: (NOTE) Calculated using the CKD-EPI Creatinine Equation (2021)    Anion gap 4 (L) 5 - 15    Comment: Performed at [facility], [address] [place], [place] [address]  Glucose, capillary     Status: Abnormal   Collection Time: 09/28/20  8:15 AM  Result Value Ref Range   Glucose-Capillary 330 (H) 70 - 99 mg/dL    Comment: Glucose reference range applies only to samples taken after fasting for at least 8 hours.  Glucose, capillary     Status: Abnormal   Collection Time: 09/28/20 11:12 AM  Result Value Ref Range   Glucose-Capillary 221 (H) 70 - 99 mg/dL    Comment: Glucose reference range applies  only to samples taken after fasting for at least 8 hours.    MR FOOT LEFT W WO CONTRAST  Result Date: 09/28/2020 CLINICAL DATA:  Foot swelling, diabetic, osteomyelitis suspected, xray done EXAM: MRI OF THE LEFT FOREFOOT WITHOUT AND WITH CONTRAST TECHNIQUE: Multiplanar, multisequence MR imaging of the left foot was performed both before and after administration of intravenous contrast. CONTRAST:  69m GADAVIST GADOBUTROL 1 MMOL/ML IV SOLN COMPARISON:  Left foot radiograph 09/27/2020 FINDINGS: Bones/Joint/Cartilage Prior second toe amputation with residual base of the proximal phalanx. There is joint space narrowing of the great toe MTP  joint, with central concavity of the base of the great toe proximal phalanx, and subchondral cystic change along the plantar aspect of the first metatarsal head with adjacent bony edema. There is edema and erosive/subchondral cystic change in the great toe sesamoids. There is preserved T1 signal throughout the great toe phalanges. There is no joint effusion. There is no evidence of acute fracture. There is mild degenerative changes involving the interphalangeal joints of the lesser digits. Ligaments The Lisfranc ligament is intact. There is no evidence of plantar plate tear. Muscles and Tendons Mild muscle atrophy in the foot. There is focal bunching of a deep flexor tendon the second toe in the forefoot compatible with prior amputation of the second toe. Soft tissues Generalized soft tissue swelling of the forefoot. There is soft tissue thickening along the plantar aspect of the prior amputation site compatible with postsurgical change/scarring. There is no evidence of drainable abscess. There is a soft tissue ulcer along the plantar aspect of the medial forefoot at the level of the MTP joint, which appears superficial. IMPRESSION: Superficial plantar soft tissue ulcer along the medial forefoot. No evidence of soft tissue abscess. Findings in the great toe are most consistent with  metatarsophalangeal and metatarso-sesamoid joint arthritis with erosive/subchondral cystic change and reactive bony edema. No definite osteomyelitis. Electronically Signed   By: Maurine Simmering   On: 09/28/2020 09:01   DG Foot Complete Left  Result Date: 09/27/2020 CLINICAL DATA:  Foot injury.  Blistering.  Diabetes. EXAM: LEFT FOOT - COMPLETE 3+ VIEW COMPARISON:  None. FINDINGS: Amputation of the second toe at the level of the proximal metaphysis of the proximal phalanx. Well corticated distal margin. Central erosion of the base of the proximal phalanx great toe with irregular head of the first metatarsal bulging into the resulting concavity. Mild sclerosis. Sclerosis and deformity in the shaft of the proximal phalanx fourth toe suspicious for a prior fracture, but age indeterminate. There is likewise some deformity along the distal metaphysis of the proximal phalanx third toe likely from a prior fracture. No bony destructive findings are identified. IMPRESSION: 1. Central erosion of the base of the proximal phalanx great toe, along with scalloped and somewhat irregular head of the first metatarsal bulging into this erosion. The appearance is well corticated and I would tend to favor gout arthropathy or rheumatoid arthritis over septic arthritis. 2. Probably old fractures of the proximal phalanges of the third and fourth toes. 3. Prior amputation of the second toe at the level of the proximal metaphysis of the proximal phalanx. Electronically Signed   By: Van Clines M.D.   On: 09/27/2020 13:49    Review of Systems  Musculoskeletal:        Left foot and leg pain  All other systems reviewed and are negative. Blood pressure 114/75, pulse 87, temperature 98.5 F (36.9 C), resp. rate 16, height '6\' 8"'$  (2.032 m), weight 77.1 kg, SpO2 100 %.  Vitals:   09/28/20 0831 09/28/20 1111  BP: 114/73 114/75  Pulse: 72 87  Resp:  16  Temp:  98.5 F (36.9 C)  SpO2:  100%    General AA&O x3. Normal mood and  affect.  Vascular Dorsalis pedis and posterior tibial pulses  present 2+ bilaterally  Capillary refill normal to all digits. Pedal hair growth normal.  Neurologic Epicritic sensation grossly  absent beyond midtarsal level .  Dermatologic (Wound) Right foot ulceration submetatarsal 4 measuring approximately 2 x 1.5 cm to subcutaneous tissue no exposed bone tendon or  joint no signs of infection  Left foot submetatarsal 1 ulceration with hyperkeratosis and a purulent filled bullae medial to this  Orthopedic: Motor intact BLE.       Assessment/Plan:  Diabetic foot infection left foot ulceration right foot -Imaging: Studies independently reviewed -Antibiotics: Continue broad-spectrum IV antibiotics for now I took a culture of the drainage today in the allow [place] to narrow him to a p.o. option for 2 weeks for discharge -WB Status: WBAT bilateral LE -Wound Care: Change daily, irrigate wound with saline, apply mupirocin ointment and dry sterile dressings.  We will continue this at discharge -Surgical Plan: I reviewed his MRI findings and after performing a bedside I&D today I do not see need for operative debridement or bone resection.  Believe the changes in the first MPJ are likely secondary to gout attacks which she says he has had several of these before in the past. -Follow-up with me 2 weeks after discharge at our [facility] office: [address], [place], [place]   Criselda Peaches 09/28/2020, 2:15 PM   Best available via secure chat for questions or concerns.    Procedure note: Following sterile prep with Betadine and using sterile pickups and scissors I performed an I&D of the left foot bulla and wound.  Purulent drainage was expressed from the bullae and cultures of this were taken.  I did require further dissection from the wound extending to the bullae into subcutaneous tissue.  No exposed bone tendon or joint was noted.  It was irrigated with saline and dressed  with Betadine gauze dressings.  He tolerated the procedure well

## 2020-09-28 NOTE — Progress Notes (Signed)
  Chaplain On-Call responded to Order Requisition for Advance Directives information for the patient.  Chaplain provided the documents and education for the patient.  The patient stated his understanding and that he will discuss the documents further with his family.  Chaplain Pollyann Samples M.Div., [facility]

## 2020-09-28 NOTE — TOC Progression Note (Signed)
Transition of Care [facility]) - Progression Note    Patient Details  Name: Leonard Johnston MRN: [id number] Date of Birth: 20-Jul-1965  Transition of Care [facility]) CM/SW [place], RN Phone Number: 09/28/2020, 2:43 PM  Clinical Narrative:    Met with the patient, completed a code 60, he stated that he will be here at least 2 more days He lives at home with his fiance, he has a sister that is a wound care nurse that will be helping him, he is going to be going to the outpatient wound care clinic to be set up by podiatry, he stated that he does not need wound care HH, he needs a RW, I notified Rhonda with [employer] to bring in  the RW to the room prior to DC, he has transportatiuon and can afford his medications        Expected Discharge Plan and Services                                                 Social Determinants of Health (SDOH) Interventions    Readmission Risk Interventions No flowsheet data found.

## 2020-09-28 NOTE — Progress Notes (Signed)
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 male with medical history significant of type 1 diabetes, hypertension, hyperlipidemia, GERD, anxiety, osteomyelitis of the foot, COVID infection, DKA, who presents with diabetic foot ulcer with infection.   Bilateral Diabetic foot ulcers:  Patient does not have fever or leukocytosis.  Currently hemodynamically stable.  --MRI left foot no definite osteomyelitis --started on vanc and ceftriaxone Plan: --I/D at bedside by podiatry --cont vanc/ceftriaxone, per podiatry until cellulitis resolves   Essential hypertension:  Patient's not taking medications currently.  BP wnl   HLD: --cont home statin   GERD: --Pepcid PRN   Uncontrolled type 1 diabetes mellitus, poorly controlled with ophthalmic complication [facility]): Recent A1c 10.7 on 08/28/20 --Patient taking NovoLog and glargine insulin at home --increase glargine to 20u nightly --add mealtime 4u TID --SSI    DVT prophylaxis: Lovenox SQ Code Status: Full code  Family Communication:  Level of care: Med-Surg Dispo:   The patient is from: home Anticipated d/c is to: home Anticipated d/c date is: 2-3 days Patient currently is not medically ready to d/c due to: on IV abx, per podiatry rec   Subjective and Interval History:  Pt received bedside I/D of left foot ulcer with podiatry today, tolerated it well.   Objective: Vitals:   09/28/20 0816 09/28/20 0831 09/28/20 1111 09/28/20 1630  BP: (!) 91/56 114/73 114/75 135/86  Pulse: 72 72 87 75  Resp: '16  16 16  '$ Temp: 98.2 F (36.8 C)  98.5 F (36.9 C) 98.6 F (37 C)  TempSrc:      SpO2: 99%  100%  100%  Weight:      Height:        Intake/Output Summary (Last 24 hours) at 09/28/2020 1919 Last data filed at 09/28/2020 0400 Gross per 24 hour  Intake 217.79 ml  Output 1000 ml  Net -782.21 ml   Filed Weights   09/27/20 1239  Weight: 77.1 kg    Examination:   Constitutional: NAD, AAOx3 HEENT: conjunctivae and lids normal, EOMI CV: No cyanosis.   RESP: normal respiratory effort, on RA Extremities: No effusions, edema in BLE.  Ulcer at bottom of left foot. SKIN: warm, dry Neuro: II - XII grossly intact.   Psych: Normal mood and affect.  Appropriate judgement and reason   Data Reviewed: I have personally reviewed following labs and imaging studies  CBC: Recent Labs  Lab 09/27/20 1243 09/28/20 0437  WBC 10.1 8.1  NEUTROABS 8.5*  --   HGB 13.8 13.0  HCT 40.9 38.9*  MCV 94.2 93.5  PLT 211 [id number]   Basic Metabolic Panel: Recent Labs  Lab 09/27/20 1243 09/28/20 0437  NA 136 134*  K 4.1 4.3  CL 102 99  CO2 26 31  GLUCOSE 316* 370*  BUN 28* 23*  CREATININE 1.15 1.00  CALCIUM 8.9 8.7*   GFR: Estimated Creatinine Clearance: 92.1 mL/min (by C-G formula based on SCr of 1 mg/dL). Liver Function Tests: Recent Labs  Lab 09/27/20 1243  AST 14*  ALT 14  ALKPHOS 76  BILITOT 0.7  PROT 7.2  ALBUMIN 3.4*   No results for input(s): LIPASE, AMYLASE in the last  168 hours. No results for input(s): AMMONIA in the last 168 hours. Coagulation Profile: Recent Labs  Lab 09/27/20 1243  INR 1.0   Cardiac Enzymes: No results for input(s): CKTOTAL, CKMB, CKMBINDEX, TROPONINI in the last 168 hours. BNP (last 3 results) No results for input(s): PROBNP in the last 8760 hours. HbA1C: No results for input(s): HGBA1C in the last 72 hours. CBG: Recent Labs  Lab 09/27/20 1721 09/27/20 2107 09/28/20 0815 09/28/20 1112 09/28/20 1628  GLUCAP 242* 361* 330* 221* 313*   Lipid Profile: No results for input(s): CHOL, HDL, LDLCALC, TRIG, CHOLHDL, LDLDIRECT in the last 72  hours. Thyroid Function Tests: No results for input(s): TSH, T4TOTAL, FREET4, T3FREE, THYROIDAB in the last 72 hours. Anemia Panel: No results for input(s): VITAMINB12, FOLATE, FERRITIN, TIBC, IRON, RETICCTPCT in the last 72 hours. Sepsis Labs: Recent Labs  Lab 09/27/20 1243 09/27/20 1444  LATICACIDVEN 1.1 1.0    Recent Results (from the past 240 hour(s))  Culture, blood (Routine x 2)     Status: None (Preliminary result)   Collection Time: 09/27/20 12:43 PM   Specimen: BLOOD  Result Value Ref Range Status   Specimen Description BLOOD RIGHT ANTECUBITAL  Final   Special Requests   Final    BOTTLES DRAWN AEROBIC AND ANAEROBIC Blood Culture adequate volume   Culture   Final    NO GROWTH < 24 HOURS Performed at [facility], [address] [place], [place] [address]    Report Status PENDING  Incomplete  Culture, blood (Routine x 2)     Status: None (Preliminary result)   Collection Time: 09/27/20  2:44 PM   Specimen: BLOOD  Result Value Ref Range Status   Specimen Description BLOOD RIGHT ANTECUBITAL  Final   Special Requests   Final    BOTTLES DRAWN AEROBIC AND ANAEROBIC Blood Culture adequate volume   Culture   Final    NO GROWTH < 24 HOURS Performed at [facility], [address], [place],  [address]    Report Status PENDING  Incomplete  Resp Panel by RT-PCR (Flu A&B, Covid) Nasopharyngeal Swab     Status: None   Collection Time: 09/27/20  4:51 PM   Specimen: Nasopharyngeal Swab; Nasopharyngeal(NP) swabs in vial transport medium  Result Value Ref Range Status   SARS Coronavirus 2 by RT PCR NEGATIVE NEGATIVE Final    Comment: (NOTE) SARS-CoV-2 target nucleic acids are NOT DETECTED.  The SARS-CoV-2 RNA is generally detectable in upper respiratory specimens during the acute phase of infection. The lowest concentration of SARS-CoV-2 viral copies this assay can detect is 138 copies/mL. A negative result does not preclude SARS-Cov-2 infection and  should not be used as the sole basis for treatment or other patient management decisions. A negative result may occur with  improper specimen collection/handling, submission of specimen other than nasopharyngeal swab, presence of viral mutation(s) within the areas targeted by this assay, and inadequate number of viral copies(<138 copies/mL). A negative result must be combined with clinical observations, patient history, and epidemiological information. The expected result is Negative.  Fact Sheet for Patients:  EntrepreneurPulse.com.au  Fact Sheet for Healthcare Providers:  IncredibleEmployment.be  This test is no t yet approved or cleared by the [place] FDA and  has been authorized for detection and/or diagnosis of SARS-CoV-2 by FDA under an Emergency Use Authorization (EUA). This EUA will remain  in effect (meaning this test can be used) for the duration of the COVID-19 declaration under Section 564(b)(1) of the  Act, 21 U.S.C.section 360bbb-3(b)(1), unless the authorization is terminated  or revoked sooner.       Influenza A by PCR NEGATIVE NEGATIVE Final   Influenza B by PCR NEGATIVE NEGATIVE Final    Comment: (NOTE) The Xpert Xpress SARS-CoV-2/FLU/RSV plus assay is intended as an aid in the diagnosis of influenza from Nasopharyngeal swab specimens and should not be used as a sole basis for treatment. Nasal washings and aspirates are unacceptable for Xpert Xpress SARS-CoV-2/FLU/RSV testing.  Fact Sheet for Patients: EntrepreneurPulse.com.au  Fact Sheet for Healthcare Providers: IncredibleEmployment.be  This test is not yet approved or cleared by the [place] FDA and has been authorized for detection and/or diagnosis of SARS-CoV-2 by FDA under an Emergency Use Authorization (EUA). This EUA will remain in effect (meaning this test can be used) for the duration of the COVID-19 declaration  under Section 564(b)(1) of the Act, 21 U.S.C. section 360bbb-3(b)(1), unless the authorization is terminated or revoked.  Performed at [facility], [place] [place],  [address]       Radiology Studies: MR FOOT LEFT W WO CONTRAST  Result Date: 09/28/2020 CLINICAL DATA:  Foot swelling, diabetic, osteomyelitis suspected, xray done EXAM: MRI OF THE LEFT FOREFOOT WITHOUT AND WITH CONTRAST TECHNIQUE: Multiplanar, multisequence MR imaging of the left foot was performed both before and after administration of intravenous contrast. CONTRAST:  108m GADAVIST GADOBUTROL 1 MMOL/ML IV SOLN COMPARISON:  Left foot radiograph 09/27/2020 FINDINGS: Bones/Joint/Cartilage Prior second toe amputation with residual base of the proximal phalanx. There is joint space narrowing of the great toe MTP joint, with central concavity of the base of the great toe proximal phalanx, and subchondral cystic change along the plantar aspect of the first metatarsal head with adjacent bony edema. There is edema and erosive/subchondral cystic change in the great toe sesamoids. There is preserved T1 signal throughout the great toe phalanges. There is no joint effusion. There is no evidence of acute fracture. There is mild degenerative changes involving the interphalangeal joints of the lesser digits. Ligaments The Lisfranc ligament is intact. There is no evidence of plantar plate tear. Muscles and Tendons Mild muscle atrophy in the foot. There is focal bunching of a deep flexor tendon the second toe in the forefoot compatible with prior amputation of the second toe. Soft tissues Generalized soft tissue swelling of the forefoot. There is soft tissue thickening along the plantar aspect of the prior amputation site compatible with postsurgical change/scarring. There is no evidence of drainable abscess. There is a soft tissue ulcer along the plantar aspect of the medial forefoot at the level of the MTP joint, which appears  superficial. IMPRESSION: Superficial plantar soft tissue ulcer along the medial forefoot. No evidence of soft tissue abscess. Findings in the great toe are most consistent with metatarsophalangeal and metatarso-sesamoid joint arthritis with erosive/subchondral cystic change and reactive bony edema. No definite osteomyelitis. Electronically Signed   By: JMaurine Simmering  On: 09/28/2020 09:01   DG Foot Complete Left  Result Date: 09/27/2020 CLINICAL DATA:  Foot injury.  Blistering.  Diabetes. EXAM: LEFT FOOT - COMPLETE 3+ VIEW COMPARISON:  None. FINDINGS: Amputation of the second toe at the level of the proximal metaphysis of the proximal phalanx. Well corticated distal margin. Central erosion of the base of the proximal phalanx great toe with irregular head of the first metatarsal bulging into the resulting concavity. Mild sclerosis. Sclerosis and deformity in the shaft of the proximal phalanx fourth toe suspicious for a prior fracture,  but age indeterminate. There is likewise some deformity along the distal metaphysis of the proximal phalanx third toe likely from a prior fracture. No bony destructive findings are identified. IMPRESSION: 1. Central erosion of the base of the proximal phalanx great toe, along with scalloped and somewhat irregular head of the first metatarsal bulging into this erosion. The appearance is well corticated and I would tend to favor gout arthropathy or rheumatoid arthritis over septic arthritis. 2. Probably old fractures of the proximal phalanges of the third and fourth toes. 3. Prior amputation of the second toe at the level of the proximal metaphysis of the proximal phalanx. Electronically Signed   By: Van Clines M.D.   On: 09/27/2020 13:49     Scheduled Meds:  atorvastatin  40 mg Oral QHS   heparin  5,000 Units Subcutaneous Q8H   insulin aspart  0-5 Units Subcutaneous QHS   insulin aspart  0-9 Units Subcutaneous TID WC   insulin glargine-yfgn  15 Units Subcutaneous QHS    multivitamin with minerals  1 tablet Oral QHS   multivitamin-lutein  1 capsule Oral Daily   mupirocin ointment  1 application Topical Daily   Continuous Infusions:  cefTRIAXone (ROCEPHIN)  IV 2 g (09/28/20 1650)   vancomycin 1,000 mg (09/28/20 1727)     LOS: 1 day     Enzo Bi, MD [facility] If 7PM-7AM, please contact night-coverage 09/28/2020, 7:19 PM

## 2020-09-28 NOTE — Consult Note (Signed)
PODIATRY CONSULTATION  NAME Leonard Johnston MRN [id number] DOB 1965-03-02 DOA 09/27/2020   Reason for consult: abscess/cellulitis left foot Chief Complaint  Patient presents with   Wound Infection    Consulting physician: Ivor Costa MD  History of present illness: 55 y.o. male PMHx diabetes mellitus type 1, history of prior toe amputation secondary to osteomyelitis presenting for evaluation of left foot pain with redness and swelling.  Patient states that he went to the beach the second week of July and sustained blisters to the bilateral feet.  He states that the right foot blister healed uneventfully while the left foot blister became red and infected over the past few days.  He came to the emergency department where he was admitted for further treatment and evaluation.  Past Medical History:  Diagnosis Date   Anxiety    Diabetes mellitus type I ([place])    Diabetes mellitus without complication ([place])    type 1   GERD (gastroesophageal reflux disease)    Hypercholesteremia     CBC Latest Ref Rng & Units 09/28/2020 09/27/2020 03/15/2020  WBC 4.0 - 10.5 K/uL 8.1 10.1 3.4(L)  Hemoglobin 13.0 - 17.0 g/dL 13.0 13.8 14.0  Hematocrit 39.0 - 52.0 % 38.9(L) 40.9 42.0  Platelets 150 - 400 K/uL 189 211 105(L)    BMP Latest Ref Rng & Units 09/28/2020 09/27/2020 03/15/2020  Glucose 70 - 99 mg/dL 370(H) 316(H) 204(H)  BUN 6 - 20 mg/dL 23(H) 28(H) 25(H)  Creatinine 0.61 - 1.24 mg/dL 1.00 1.15 1.11  Sodium 135 - 145 mmol/L 134(L) 136 140  Potassium 3.5 - 5.1 mmol/L 4.3 4.1 4.9  Chloride 98 - 111 mmol/L 99 102 97(L)  CO2 22 - 32 mmol/L 31 26 33(H)  Calcium 8.9 - 10.3 mg/dL 8.7(L) 8.9 8.9         Physical Exam: General: The patient is alert and oriented x3 in no acute distress.   Dermatology: Ulcer noted to the right foot appears to be healing without any complications.  It is dry and stable.  Periwound is callused however there is no erythema or edema noted to the right foot.  Left foot  demonstrates erythema with edema and likely abscess around the first MTPJ.  At the moment there does not appear to be any significant purulence or drainage.  No malodor noted.  Vascular: Palpable pedal pulses bilaterally.  Capillary refill immediate.  Neurological: Epicritic and protective threshold diminished bilaterally.   Musculoskeletal Exam: History of prior toe amputation, left second, secondary to osteomyelitis  Radiographic exam LT foot 09/27/2020: Amputation of the second toe at the level of the proximal metaphysis of the proximal phalanx. Well corticated distal margin.   Central erosion of the base of the proximal phalanx great toe with irregular head of the first metatarsal bulging into the resulting concavity. Mild sclerosis.   Sclerosis and deformity in the shaft of the proximal phalanx fourth toe suspicious for a prior fracture, but age indeterminate. There is likewise some deformity along the distal metaphysis of the proximal phalanx third toe likely from a prior fracture.   No bony destructive findings are identified.   IMPRESSION: 1. Central erosion of the base of the proximal phalanx great toe, along with scalloped and somewhat irregular head of the first metatarsal bulging into this erosion. The appearance is well corticated and I would tend to favor gout arthropathy or rheumatoid arthritis over septic arthritis. 2. Probably old fractures of the proximal phalanges of the third and fourth toes. 3.  Prior amputation of the second toe at the level of the proximal metaphysis of the proximal phalanx.  MRI LT FOOT W WO contrast pending   ASSESSMENT/PLAN OF CARE Abscess/cellulitis left first MTPJ -Unless the appearance of the foot improves significantly over the next 24 hours while MRI is pending, the patient will likely need incision and drainage with debridement of the left first MTPJ. -MRI pending -Continue IV antibiotics -Podiatry will follow and determine  whether he needs to go to surgery for irrigation and debridement of the foot     Thank you for the consult.  Please contact me directly with any questions or concerns.  Cell UV:[phone]   Edrick Kins, DPM [facility]  Dr. Edrick Kins, DPM    [address], [place] [address]                Office [phone]  Fax [phone]

## 2020-09-29 DIAGNOSIS — E11628 Type 2 diabetes mellitus with other skin complications: Secondary | ICD-10-CM | POA: Diagnosis not present

## 2020-09-29 DIAGNOSIS — L089 Local infection of the skin and subcutaneous tissue, unspecified: Secondary | ICD-10-CM | POA: Diagnosis not present

## 2020-09-29 LAB — CBC
HCT: 43.3 % (ref 39.0–52.0)
Hemoglobin: 14.2 g/dL (ref 13.0–17.0)
MCH: 30.6 pg (ref 26.0–34.0)
MCHC: 32.8 g/dL (ref 30.0–36.0)
MCV: 93.3 fL (ref 80.0–100.0)
Platelets: 200 10*3/uL (ref 150–400)
RBC: 4.64 MIL/uL (ref 4.22–5.81)
RDW: 13.4 % (ref 11.5–15.5)
WBC: 4.8 10*3/uL (ref 4.0–10.5)
nRBC: 0 % (ref 0.0–0.2)

## 2020-09-29 LAB — GLUCOSE, CAPILLARY
Glucose-Capillary: 291 mg/dL — ABNORMAL HIGH (ref 70–99)
Glucose-Capillary: 255 mg/dL — ABNORMAL HIGH (ref 70–99)
Glucose-Capillary: 271 mg/dL — ABNORMAL HIGH (ref 70–99)
Glucose-Capillary: 208 mg/dL — ABNORMAL HIGH (ref 70–99)
Glucose-Capillary: 180 mg/dL — ABNORMAL HIGH (ref 70–99)

## 2020-09-29 LAB — BASIC METABOLIC PANEL
Anion gap: 7 (ref 5–15)
BUN: 24 mg/dL — ABNORMAL HIGH (ref 6–20)
CO2: 31 mmol/L (ref 22–32)
Calcium: 8.9 mg/dL (ref 8.9–10.3)
Chloride: 99 mmol/L (ref 98–111)
Creatinine, Ser: 1.07 mg/dL (ref 0.61–1.24)
GFR, Estimated: >60 mL/min (ref >60)
Glucose, Bld: 250 mg/dL — ABNORMAL HIGH (ref 70–99)
Potassium: 4.3 mmol/L (ref 3.5–5.1)
Sodium: 137 mmol/L (ref 135–145)

## 2020-09-29 LAB — MAGNESIUM: Magnesium: 2.1 mg/dL (ref 1.7–2.4)

## 2020-09-29 MED ORDER — SODIUM CHLORIDE 0.9 % IV SOLN
INTRAVENOUS | Status: DC | PRN
  Administered 2020-09-29: 1000 mL via INTRAVENOUS
  Administered 2020-09-29 – 2020-09-30 (×2): 250 mL via INTRAVENOUS

## 2020-09-29 NOTE — Progress Notes (Signed)
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 male with medical history significant of type 1 diabetes, hypertension, hyperlipidemia, GERD, anxiety, osteomyelitis of the foot, COVID infection, DKA, who presents with diabetic foot ulcer with infection.   Bilateral Diabetic foot ulcers:  Patient does not have fever or leukocytosis.  Currently hemodynamically stable.  --MRI left foot no definite osteomyelitis --started on vanc and ceftriaxone --I/D of left foot ulcer at bedside by podiatry on 8/3 Plan: --cont vanc/ceftriaxone for 1 more day   Essential hypertension:  Patient's not taking medications currently.  BP wnl --monitor   HLD: --cont home statin   GERD: --Pepcid PRN   Uncontrolled type 1 diabetes mellitus, poorly controlled with ophthalmic complication [facility]): Recent A1c 10.7 on 08/28/20 --Patient taking NovoLog and glargine insulin at home --cont glargine 20u nightly --cont mealtime 4u TID --SSI    DVT prophylaxis: Lovenox SQ Code Status: Full code  Family Communication:  Level of care: Med-Surg Dispo:   The patient is from: home Anticipated d/c is to: home Anticipated d/c date is: tomorrow Patient currently is not medically ready to d/c due to: on IV abx, per podiatry rec   Subjective and Interval History:  Pain and swelling in left foot improved.     Objective: Vitals:   09/29/20 0428 09/29/20 0736 09/29/20 1158 09/29/20 1458  BP: 133/82 (!) 142/91 126/84 113/66  Pulse: 76 81 86 80  Resp: '20 16 16   '$ Temp: 97.6 F (36.4 C) 98.2 F (36.8 C) 98 F (36.7 C) 97.8 F (36.6 C)  TempSrc:      SpO2: 97% 98% 100% 96%   Weight:      Height:        Intake/Output Summary (Last 24 hours) at 09/29/2020 1750 Last data filed at 09/29/2020 0030 Gross per 24 hour  Intake --  Output 450 ml  Net -450 ml   Filed Weights   09/27/20 1239  Weight: 77.1 kg    Examination:   Constitutional: NAD, AAOx3 HEENT: conjunctivae and lids normal, EOMI CV: No cyanosis.   RESP: normal respiratory effort, on RA Extremities: erythema and edema in left foot, though improved. SKIN: warm, dry Neuro: II - XII grossly intact.   Psych: Normal mood and affect.  Appropriate judgement and reason   Data Reviewed: I have personally reviewed following labs and imaging studies  CBC: Recent Labs  Lab 09/27/20 1243 09/28/20 0437 09/29/20 0442  WBC 10.1 8.1 4.8  NEUTROABS 8.5*  --   --   HGB 13.8 13.0 14.2  HCT 40.9 38.9* 43.3  MCV 94.2 93.5 93.3  PLT 211 189 [id number]   Basic Metabolic Panel: Recent Labs  Lab 09/27/20 1243 09/28/20 0437 09/29/20 0442  NA 136 134* 137  K 4.1 4.3 4.3  CL 102 99 99  CO2 '26 31 31  '$ GLUCOSE 316* 370* 250*  BUN 28* 23* 24*  CREATININE 1.15 1.00 1.07  CALCIUM 8.9 8.7* 8.9  MG  --   --  2.1   GFR: Estimated Creatinine Clearance: 86.1 mL/min (by C-G formula based on SCr of 1.07 mg/dL). Liver Function Tests: Recent Labs  Lab 09/27/20 1243  AST 14*  ALT 14  ALKPHOS 76  BILITOT 0.7  PROT 7.2  ALBUMIN 3.4*   No results for input(s): LIPASE, AMYLASE in the last 168 hours. No results for input(s): AMMONIA in the last 168 hours. Coagulation Profile: Recent Labs  Lab 09/27/20 1243  INR 1.0   Cardiac Enzymes: No results for input(s): CKTOTAL, CKMB, CKMBINDEX, TROPONINI in the last 168 hours. BNP (last 3 results) No results for input(s): PROBNP in the last 8760 hours. HbA1C: No results for input(s): HGBA1C in the last 72 hours. CBG: Recent Labs  Lab 09/28/20 2041 09/29/20 0746 09/29/20 1155 09/29/20 1341 09/29/20 1618  GLUCAP 257* 291* 255* 271* 208*   Lipid Profile: No  results for input(s): CHOL, HDL, LDLCALC, TRIG, CHOLHDL, LDLDIRECT in the last 72 hours. Thyroid Function Tests: No results for input(s): TSH, T4TOTAL, FREET4, T3FREE, THYROIDAB in the last 72 hours. Anemia Panel: No results for input(s): VITAMINB12, FOLATE, FERRITIN, TIBC, IRON, RETICCTPCT in the last 72 hours. Sepsis Labs: Recent Labs  Lab 09/27/20 1243 09/27/20 1444  LATICACIDVEN 1.1 1.0    Recent Results (from the past 240 hour(s))  Culture, blood (Routine x 2)     Status: None (Preliminary result)   Collection Time: 09/27/20 12:43 PM   Specimen: BLOOD  Result Value Ref Range Status   Specimen Description BLOOD RIGHT ANTECUBITAL  Final   Special Requests   Final    BOTTLES DRAWN AEROBIC AND ANAEROBIC Blood Culture adequate volume   Culture   Final    NO GROWTH 2 DAYS Performed at [facility], [address] [place], [place] [address]    Report Status PENDING  Incomplete  Culture, blood (Routine x 2)     Status: None (Preliminary result)   Collection Time: 09/27/20  2:44 PM   Specimen: BLOOD  Result Value Ref Range Status   Specimen Description BLOOD RIGHT ANTECUBITAL  Final   Special Requests   Final    BOTTLES DRAWN AEROBIC AND ANAEROBIC Blood Culture adequate volume   Culture   Final    NO GROWTH 2 DAYS Performed at [facility], [address], [place], [place] [address]    Report Status PENDING  Incomplete  Resp Panel by RT-PCR (Flu A&B, Covid) Nasopharyngeal Swab     Status: None   Collection Time: 09/27/20  4:51 PM   Specimen: Nasopharyngeal Swab; Nasopharyngeal(NP) swabs in vial transport medium  Result Value Ref Range Status   SARS Coronavirus 2 by RT PCR NEGATIVE NEGATIVE Final    Comment: (NOTE) SARS-CoV-2 target nucleic acids are NOT DETECTED.  The SARS-CoV-2 RNA is generally detectable in upper respiratory specimens during the acute phase of infection. The lowest concentration of SARS-CoV-2 viral copies this assay can detect  is 138 copies/mL. A negative result does not preclude SARS-Cov-2 infection and should not be used as the sole basis for treatment or other patient management decisions. A negative result may occur with  improper specimen collection/handling, submission of specimen other than nasopharyngeal swab, presence of viral mutation(s) within the areas targeted by this assay, and inadequate number of viral copies(<138 copies/mL). A negative result must be combined with clinical observations, patient history, and epidemiological information. The expected result is Negative.  Fact Sheet for Patients:  EntrepreneurPulse.com.au  Fact Sheet for Healthcare Providers:  IncredibleEmployment.be  This test is no t yet approved or cleared by the [place] FDA and  has been authorized for detection and/or diagnosis of SARS-CoV-2 by FDA under an Emergency Use Authorization (EUA). This EUA  will remain  in effect (meaning this test can be used) for the duration of the COVID-19 declaration under Section 564(b)(1) of the Act, 21 U.S.C.section 360bbb-3(b)(1), unless the authorization is terminated  or revoked sooner.       Influenza A by PCR NEGATIVE NEGATIVE Final   Influenza B by PCR NEGATIVE NEGATIVE Final    Comment: (NOTE) The Xpert Xpress SARS-CoV-2/FLU/RSV plus assay is intended as an aid in the diagnosis of influenza from Nasopharyngeal swab specimens and should not be used as a sole basis for treatment. Nasal washings and aspirates are unacceptable for Xpert Xpress SARS-CoV-2/FLU/RSV testing.  Fact Sheet for Patients: EntrepreneurPulse.com.au  Fact Sheet for Healthcare Providers: IncredibleEmployment.be  This test is not yet approved or cleared by the [place] FDA and has been authorized for detection and/or diagnosis of SARS-CoV-2 by FDA under an Emergency Use Authorization (EUA). This EUA will remain in effect  (meaning this test can be used) for the duration of the COVID-19 declaration under Section 564(b)(1) of the Act, 21 U.S.C. section 360bbb-3(b)(1), unless the authorization is terminated or revoked.  Performed at [facility], [address], [place], [place] [address]   Aerobic/Anaerobic Culture w Gram Stain (surgical/deep wound)     Status: None (Preliminary result)   Collection Time: 09/28/20  2:38 PM   Specimen: Abscess  Result Value Ref Range Status   Specimen Description   Final    ABSCESS Performed at [facility], [address], [place], [place] [address]    Special Requests   Final    LEFT FOOT Performed at [facility], [address] [place], [place] [address]    Gram Stain   Final    MODERATE SQUAMOUS EPITHELIAL CELLS PRESENT MODERATE WBC PRESENT, PREDOMINANTLY PMN RARE GRAM POSITIVE COCCI IN PAIRS    Culture   Final    TOO YOUNG TO READ Performed at [facility], [place] [address], [place], [place] [address]    Report Status PENDING  Incomplete      Radiology Studies: MR FOOT LEFT W WO CONTRAST  Result Date: 09/28/2020 CLINICAL DATA:  Foot swelling, diabetic, osteomyelitis suspected, xray done EXAM: MRI OF THE LEFT FOREFOOT WITHOUT AND WITH CONTRAST TECHNIQUE: Multiplanar, multisequence MR imaging of the left foot was performed both before and after administration of intravenous contrast. 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 There is joint space narrowing of the great toe MTP joint, with central concavity of the base of the great toe proximal phalanx, and subchondral cystic change along the plantar aspect of the first metatarsal head with adjacent bony edema. There is edema and erosive/subchondral cystic change in the great toe sesamoids. There is preserved T1 signal  throughout the great toe phalanges. There is no joint effusion. There is no evidence of acute fracture. There is mild degenerative changes involving the interphalangeal joints of the lesser digits. Ligaments The Lisfranc ligament is intact. There is no evidence of plantar plate tear. Muscles and Tendons Mild muscle atrophy in the foot. There is focal bunching of a deep flexor tendon the second toe in the forefoot compatible with prior amputation of the second toe. Soft tissues Generalized soft tissue swelling of the forefoot. There is soft tissue thickening along the plantar aspect of the prior amputation site compatible with postsurgical change/scarring. There is no evidence of drainable abscess. There is a  soft tissue ulcer along the plantar aspect of the medial forefoot at the level of the MTP joint, which appears superficial. IMPRESSION: Superficial plantar soft tissue ulcer along the medial forefoot. No evidence of soft tissue abscess. Findings in the great toe are most consistent with metatarsophalangeal and metatarso-sesamoid joint arthritis with erosive/subchondral cystic change and reactive bony edema. No definite osteomyelitis. Electronically Signed   By: Maurine Simmering   On: 09/28/2020 09:01     Scheduled Meds:  atorvastatin  40 mg Oral QHS   enoxaparin (LOVENOX) injection  40 mg Subcutaneous Q24H   insulin aspart  0-5 Units Subcutaneous QHS   insulin aspart  0-9 Units Subcutaneous TID WC   insulin aspart  4 Units Subcutaneous TID WC   insulin glargine-yfgn  20 Units Subcutaneous QHS   multivitamin with minerals  1 tablet Oral QHS   multivitamin-lutein  1 capsule Oral Daily   mupirocin ointment  1 application Topical Daily   Continuous Infusions:  sodium chloride 250 mL (09/29/20 1539)   cefTRIAXone (ROCEPHIN)  IV 2 g (09/29/20 1541)   vancomycin 1,000 mg (09/29/20 1628)     LOS: 1 day     Enzo Bi, MD [facility] If 7PM-7AM, please contact night-coverage 09/29/2020, 5:50 PM

## 2020-09-29 NOTE — Progress Notes (Signed)
Inpatient Diabetes Program Recommendations  AACE/ADA: New Consensus Statement on Inpatient Glycemic Control (2015)  Target Ranges:  Prepandial:   less than 140 mg/dL      Peak postprandial:   less than 180 mg/dL (1-2 hours)      Critically ill patients:  140 - 180 mg/dL   Lab Results  Component Value Date   GLUCAP 291 (H) 09/29/2020   HGBA1C 9.1 (H) 03/14/2020    Review of Glycemic Control Results for Leonard Johnston, Leonard Johnston (MRN [id number]) as of 09/29/2020 10:25  Ref. Range 09/28/2020 08:15 09/28/2020 11:12 09/28/2020 16:28 09/28/2020 20:41 09/29/2020 07:46  Glucose-Capillary Latest Ref Range: 70 - 99 mg/dL 330 (H) 221 (H) 313 (H) 257 (H) 291 (H)   Diabetes history: DM1 Outpatient Diabetes medications:  Lantus 20 units QHS Novolog 0-13 units TID  Novolog 1 unit/13 carbs Current orders for Inpatient glycemic control: Semglee 20 QHS (increased last night), Novolog 0-9 units TID and 0-5 units QHS, Novolog 4 units TID (started this morning)  Inpatient Diabetes Program Recommendations:    Semglee 30 units QHS.  Spoke with patient at bedside.  He confirms above home medications.  He is current with Dr. Gabriel Carina; however, is looking for a different endocrinologist as their personalities do not mesh.  He states his A1C is elevated due to his foot infection.  He states he checks his blood sugar 4 x a day.  He uses an app to plug in his carbs which tells him how much insulin to administer.  He never skips doses and rotates sites appropriately.  He is currently in the process of getting a Dexcom.  He does not drink any beverages with sugar.  He walks his dogs daily.  He has all of his insulin and supplies at bedside and has shown me what he has in his bag.  He seems to stay on top of his diabetes.  Hopefully it is this infection which has elevated his A1C and he will be able to get it closer to goal.  Discussed long and short term complications of uncontrolled blood sugars.  He has occasional hypoglycemia and is aware of  signs, symptoms and treatments.  Encouraged him to look for and endocrinologist who would be a better fit for him.    Will continue to follow while inpatient.  Thank you, Reche Dixon, RN, BSN Diabetes Coordinator Inpatient Diabetes Program [phone] (team pager from 8a-5p)

## 2020-09-30 DIAGNOSIS — E11628 Type 2 diabetes mellitus with other skin complications: Secondary | ICD-10-CM | POA: Diagnosis not present

## 2020-09-30 DIAGNOSIS — L089 Local infection of the skin and subcutaneous tissue, unspecified: Secondary | ICD-10-CM | POA: Diagnosis not present

## 2020-09-30 LAB — BASIC METABOLIC PANEL
Anion gap: 4 — ABNORMAL LOW (ref 5–15)
BUN: 24 mg/dL — ABNORMAL HIGH (ref 6–20)
CO2: 29 mmol/L (ref 22–32)
Calcium: 8.7 mg/dL — ABNORMAL LOW (ref 8.9–10.3)
Chloride: 103 mmol/L (ref 98–111)
Creatinine, Ser: 1.03 mg/dL (ref 0.61–1.24)
GFR, Estimated: >60 mL/min (ref >60)
Glucose, Bld: 294 mg/dL — ABNORMAL HIGH (ref 70–99)
Potassium: 4.2 mmol/L (ref 3.5–5.1)
Sodium: 136 mmol/L (ref 135–145)

## 2020-09-30 LAB — CBC
HCT: 39 % (ref 39.0–52.0)
Hemoglobin: 13.2 g/dL (ref 13.0–17.0)
MCH: 31.5 pg (ref 26.0–34.0)
MCHC: 33.8 g/dL (ref 30.0–36.0)
MCV: 93.1 fL (ref 80.0–100.0)
Platelets: 192 10*3/uL (ref 150–400)
RBC: 4.19 MIL/uL — ABNORMAL LOW (ref 4.22–5.81)
RDW: 13.4 % (ref 11.5–15.5)
WBC: 6.8 10*3/uL (ref 4.0–10.5)
nRBC: 0 % (ref 0.0–0.2)

## 2020-09-30 LAB — GLUCOSE, CAPILLARY
Glucose-Capillary: 320 mg/dL — ABNORMAL HIGH (ref 70–99)
Glucose-Capillary: 160 mg/dL — ABNORMAL HIGH (ref 70–99)

## 2020-09-30 LAB — MAGNESIUM: Magnesium: 2.1 mg/dL (ref 1.7–2.4)

## 2020-09-30 MED ORDER — SULFAMETHOXAZOLE-TRIMETHOPRIM 800-160 MG PO TABS: 1.0000 | ORAL_TABLET | Freq: Two times a day (BID) | ORAL | 0 refills | Status: AC

## 2020-09-30 MED ORDER — MUPIROCIN 2 % EX OINT
1.0000 | TOPICAL_OINTMENT | Freq: Every day | CUTANEOUS | 0 refills | Status: DC
Start: 2020-09-30 — End: 2021-09-27

## 2020-09-30 MED ORDER — SODIUM CHLORIDE 0.9 % IV SOLN
2.0000 g | INTRAVENOUS | Status: DC
  Administered 2020-09-30: 2 g via INTRAVENOUS
  Filled 2020-09-30: qty 20

## 2020-09-30 MED ORDER — AMOXICILLIN-POT CLAVULANATE 875-125 MG PO TABS: 1.0000 | ORAL_TABLET | Freq: Two times a day (BID) | ORAL | 0 refills | Status: AC

## 2020-09-30 MED ORDER — BASAGLAR KWIKPEN 100 UNIT/ML ~~LOC~~ SOPN: 30.0000 [IU] | PEN_INJECTOR | Freq: Every day | SUBCUTANEOUS | 2 refills | Status: DC

## 2020-09-30 MED ORDER — BASAGLAR KWIKPEN 100 UNIT/ML ~~LOC~~ SOPN: 30.0000 [IU] | PEN_INJECTOR | Freq: Every day | SUBCUTANEOUS | Status: DC

## 2020-09-30 NOTE — Discharge Summary (Signed)
Physician Discharge Summary   Leonard Johnston  male DOB: [birth year]  [id number]  PCP: Derinda Late, MD  Admit date: 09/27/2020 Discharge date: 09/30/2020  Admitted From: home Disposition:  home Home Health: No  Pt said he didn't need [facility] wound care. CODE STATUS: Full code  Discharge Instructions     Discharge instructions   Complete by: As directed    You have received 4 days of IV antibiotics for your foot ulcer and cellulitis.  Please finish 10 more days with Bactrim and Augmentin at home as directed.  Follow-up with Dr. Sherryle Lis 2 weeks after discharge at [facility] office: [address], [place], [place]   Dr. Enzo Bi - -   Discharge wound care:   Complete by: As directed    Wound care to bilateral neuropathic foot ulcerations, R/L:   Change daily, irrigate wound with saline, apply mupirocin ointment and dry sterile dressings.  We will continue this at discharge [facility] Course:  For full details, please see H&P, progress notes, consult notes and ancillary notes.  Briefly,  Leonard Johnston is a 55 y.o. male with medical history significant of type 1 diabetes, hypertension, anxiety, osteomyelitis of the foot, COVID infection, DKA, who presented with diabetic foot ulcer with infection.   Bilateral Diabetic foot ulcers:  Patient does not have fever or leukocytosis.  Hemodynamically stable.  --MRI left foot no definite osteomyelitis --started on vanc and ceftriaxone --I/D of left foot ulcer at bedside by podiatry on 8/3 --Pt received 4 days of IV antibiotic and discharged with 10 more days of Bactrim and Augmentin.  Pt confirmed that he would be able to do wound care and dressing changes at home. --Pt will Follow up with Dr. Sherryle Lis 2 weeks after discharge    Essential hypertension, not currently active Patient's not taking medications currently.  BP wnl   HLD: --cont home statin   GERD: --Pepcid PRN   Uncontrolled type 1  diabetes mellitus, poorly controlled with ophthalmic complication [facility]):  Recent A1c 10.7 on 08/28/20 --Patient taking NovoLog and glargine insulin at home --received glargine 20u nightly, mealtime 4u TID and SSI while inpatient. --discharged on home regime as below.   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 The score is based on dignosis, age, lab data, medications, orders, and past utilization.   Low:  0-14.9   Medium: 15-21.9   High: 22-29.9   Extreme: 30 and above         Discharge Instructions:  Allergies as of 09/30/2020   No Known Allergies      Medication List     TAKE these medications    amoxicillin-clavulanate 875-125 MG tablet Commonly known as: Augmentin Take 1 tablet by mouth 2 (two) times daily for 10 days.   atorvastatin 40 MG tablet Commonly known as: LIPITOR Take 40 mg by mouth daily.   Basaglar KwikPen 100 UNIT/ML Inject 30 Units into the skin  at bedtime. What changed: how much to take   BD Pen Needle Micro U/F 32G X 6 MM Misc Generic drug: Insulin Pen Needle 4 (four) times daily. use as directed   Dexcom G6 Receiver [place] Use as directed. [place] [phone]   Dexcom G6 Sensor Misc Use 1 Device every 10 (ten) days NDC [phone]. Run through Medicare partB   Dexcom G6 Transmitter Misc Use as directed, every 3 months. [place] [phone]. Run through Medicare partB   famotidine 40 MG tablet Commonly known  as: PEPCID Take 1 tablet by mouth daily.   insulin aspart 100 UNIT/ML FlexPen Commonly known as: NOVOLOG Inject 0-13 Units into the skin 3 (three) times daily with meals. Per sliding scale on smart phone.   Multi-Vitamins Tabs Take by mouth.   multivitamin-lutein Caps capsule Take 1 capsule by mouth daily.   mupirocin ointment 2 % Commonly known as: BACTROBAN Apply 1 application topically daily.   sulfamethoxazole-trimethoprim 800-160 MG tablet Commonly known as: BACTRIM DS Take 1 tablet by mouth 2 (two) times daily for 10 days.               Durable Medical Equipment  (From admission, onward)           Start     Ordered   09/28/20 1442  For home use only DME Walker rolling  Once       Question Answer Comment  Walker: With [place] Wheels   Patient needs a walker to treat with the following condition Unable to bear weight      09/28/20 1442              Discharge Care Instructions  (From admission, onward)           Start     Ordered   09/30/20 0000  Discharge wound care:       Comments: Wound care to bilateral neuropathic foot ulcerations, R/L:   Change daily, irrigate wound with saline, apply mupirocin ointment and dry sterile dressings.  We will continue this at discharge - -   09/30/20 [id number]             Follow-up Information     Dr. Sherryle Lis, Podiatry Follow up in 2 week(s).   Why: wound check Contact information: [address] [place], [place]        Derinda Late, MD Follow up in 1 week(s).   Specialty: Family Medicine Contact information: [address] [facility] [place] [address] [phone]                 No Known Allergies   The results of significant diagnostics from this hospitalization (including imaging, microbiology, ancillary and laboratory) are listed below for reference.   Consultations:   Procedures/Studies: MR FOOT LEFT W WO  CONTRAST  Result Date: 09/28/2020 CLINICAL DATA:  Foot swelling, diabetic, osteomyelitis suspected, xray done EXAM: MRI OF THE LEFT FOREFOOT WITHOUT AND WITH CONTRAST TECHNIQUE: Multiplanar, multisequence MR imaging of the left foot was performed both before and after administration of intravenous contrast. CONTRAST:  60m GADAVIST GADOBUTROL 1 MMOL/ML IV SOLN COMPARISON:  Left foot radiograph 09/27/2020 FINDINGS: Bones/Joint/Cartilage Prior second toe amputation with residual base of the proximal phalanx. There is joint space narrowing of the great toe MTP joint, with central concavity of the base of the great toe proximal phalanx, and subchondral cystic change along the plantar aspect of the first metatarsal head with adjacent bony edema. There  is edema and erosive/subchondral cystic change in the great toe sesamoids. There is preserved T1 signal throughout the great toe phalanges. There is no joint effusion. There is no evidence of acute fracture. There is mild degenerative changes involving the interphalangeal joints of the lesser digits. Ligaments The Lisfranc ligament is intact. There is no evidence of plantar plate tear. Muscles and Tendons Mild muscle atrophy in the foot. There is focal bunching of a deep flexor tendon the second toe in the forefoot compatible with prior amputation of the second toe. Soft tissues Generalized soft tissue swelling of the forefoot. There is soft tissue thickening along the plantar aspect of the prior amputation site compatible with postsurgical change/scarring. There is no evidence of drainable abscess. There is a soft tissue ulcer along the plantar aspect of the medial forefoot at the level of the MTP joint, which appears superficial. IMPRESSION: Superficial plantar soft tissue ulcer along the medial forefoot. No evidence of soft tissue abscess. Findings in the great toe are most consistent with metatarsophalangeal and metatarso-sesamoid joint arthritis with  erosive/subchondral cystic change and reactive bony edema. No definite osteomyelitis. Electronically Signed   By: Maurine Simmering   On: 09/28/2020 09:01   DG Foot Complete Left  Result Date: 09/27/2020 CLINICAL DATA:  Foot injury.  Blistering.  Diabetes. EXAM: LEFT FOOT - COMPLETE 3+ VIEW COMPARISON:  None. FINDINGS: Amputation of the second toe at the level of the proximal metaphysis of the proximal phalanx. Well corticated distal margin. Central erosion of the base of the proximal phalanx great toe with irregular head of the first metatarsal bulging into the resulting concavity. Mild sclerosis. Sclerosis and deformity in the shaft of the proximal phalanx fourth toe suspicious for a prior fracture, but age indeterminate. There is likewise some deformity along the distal metaphysis of the proximal phalanx third toe likely from a prior fracture. No bony destructive findings are identified. IMPRESSION: 1. Central erosion of the base of the proximal phalanx great toe, along with scalloped and somewhat irregular head of the first metatarsal bulging into this erosion. The appearance is well corticated and I would tend to favor gout arthropathy or rheumatoid arthritis over septic arthritis. 2. Probably old fractures of the proximal phalanges of the third and fourth toes. 3. Prior amputation of the second toe at the level of the proximal metaphysis of the proximal phalanx. Electronically Signed   By: Van Clines M.D.   On: 09/27/2020 13:49      Labs: BNP (last 3 results) Recent Labs    03/12/20 1009  BNP 99991111   Basic Metabolic Panel: Recent Labs  Lab 09/27/20 1243 09/28/20 0437 09/29/20 0442 09/30/20 0430  NA 136 134* 137 136  K 4.1 4.3 4.3 4.2  CL 102 99 99 103  CO2 '26 31 31 29  '$ GLUCOSE 316* 370* 250* 294*  BUN 28* 23* 24* 24*  CREATININE 1.15 1.00 1.07 1.03  CALCIUM 8.9 8.7* 8.9 8.7*  MG  --   --  2.1 2.1   Liver Function Tests: Recent Labs  Lab 09/27/20 1243  AST 14*  ALT 14   ALKPHOS 76  BILITOT 0.7  PROT 7.2  ALBUMIN 3.4*   No results for input(s): LIPASE, AMYLASE in the last 168 hours. No results for input(s): AMMONIA in the last 168 hours. CBC: Recent Labs  Lab 09/27/20 1243 09/28/20 0437 09/29/20 0442 09/30/20 0430  WBC 10.1 8.1 4.8 6.8  NEUTROABS 8.5*  --   --   --   HGB  13.8 13.0 14.2 13.2  HCT 40.9 38.9* 43.3 39.0  MCV 94.2 93.5 93.3 93.1  PLT 211 189 200 192   Cardiac Enzymes: No results for input(s): CKTOTAL, CKMB, CKMBINDEX, TROPONINI in the last 168 hours. BNP: Invalid input(s): POCBNP CBG: Recent Labs  Lab 09/29/20 1155 09/29/20 1341 09/29/20 1618 09/29/20 2047 09/30/20 0738  GLUCAP 255* 271* 208* 180* 320*   D-Dimer No results for input(s): DDIMER in the last 72 hours. Hgb A1c No results for input(s): HGBA1C in the last 72 hours. Lipid Profile No results for input(s): CHOL, HDL, LDLCALC, TRIG, CHOLHDL, LDLDIRECT in the last 72 hours. Thyroid function studies No results for input(s): TSH, T4TOTAL, T3FREE, THYROIDAB in the last 72 hours.  Invalid input(s): FREET3 Anemia work up No results for input(s): VITAMINB12, FOLATE, FERRITIN, TIBC, IRON, RETICCTPCT in the last 72 hours. Urinalysis    Component Value Date/Time   COLORURINE YELLOW (A) 09/27/2020 1444   APPEARANCEUR CLEAR (A) 09/27/2020 1444   APPEARANCEUR Clear 03/02/2014 1943   LABSPEC 1.025 09/27/2020 1444   LABSPEC 1.017 03/02/2014 1943   PHURINE 5.0 09/27/2020 1444   GLUCOSEU >=500 (A) 09/27/2020 1444   GLUCOSEU >=500 03/02/2014 1943   HGBUR SMALL (A) 09/27/2020 1444   BILIRUBINUR NEGATIVE 09/27/2020 1444   BILIRUBINUR Negative 03/02/2014 1943   KETONESUR 5 (A) 09/27/2020 1444   PROTEINUR NEGATIVE 09/27/2020 1444   NITRITE NEGATIVE 09/27/2020 1444   LEUKOCYTESUR NEGATIVE 09/27/2020 1444   LEUKOCYTESUR Negative 03/02/2014 1943   Sepsis Labs Invalid input(s): PROCALCITONIN,  WBC,  LACTICIDVEN Microbiology Recent Results (from the past 240 hour(s))   Culture, blood (Routine x 2)     Status: None (Preliminary result)   Collection Time: 09/27/20 12:43 PM   Specimen: BLOOD  Result Value Ref Range Status   Specimen Description BLOOD RIGHT ANTECUBITAL  Final   Special Requests   Final    BOTTLES DRAWN AEROBIC AND ANAEROBIC Blood Culture adequate volume   Culture   Final    NO GROWTH 3 DAYS Performed at [facility], [address], [place], [place] [address]    Report Status PENDING  Incomplete  Culture, blood (Routine x 2)     Status: None (Preliminary result)   Collection Time: 09/27/20  2:44 PM   Specimen: BLOOD  Result Value Ref Range Status   Specimen Description BLOOD RIGHT ANTECUBITAL  Final   Special Requests   Final    BOTTLES DRAWN AEROBIC AND ANAEROBIC Blood Culture adequate volume   Culture   Final    NO GROWTH 3 DAYS Performed at [facility], [address], [place], [place] [address]    Report Status PENDING  Incomplete  Resp Panel by RT-PCR (Flu A&B, Covid) Nasopharyngeal Swab     Status: None   Collection Time: 09/27/20  4:51 PM   Specimen: Nasopharyngeal Swab; Nasopharyngeal(NP) swabs in vial transport medium  Result Value Ref Range Status   SARS Coronavirus 2 by RT PCR NEGATIVE NEGATIVE Final    Comment: (NOTE) SARS-CoV-2 target nucleic acids are NOT DETECTED.  The SARS-CoV-2 RNA is generally detectable in upper respiratory specimens during the acute phase of infection. The lowest concentration of SARS-CoV-2 viral copies this assay can detect is 138 copies/mL. A negative result does not preclude SARS-Cov-2 infection and should not be used as the sole basis for treatment or other patient management decisions. A negative result may occur with  improper specimen collection/handling, submission of specimen other than nasopharyngeal swab, presence of viral mutation(s) within the  areas targeted by this assay, and inadequate number of viral copies(<138 copies/mL). A negative result must be  combined with clinical observations, patient history, and epidemiological information. The expected result is Negative.  Fact Sheet for Patients:  EntrepreneurPulse.com.au  Fact Sheet for Healthcare Providers:  IncredibleEmployment.be  This test is no t yet approved or cleared by the [place] FDA and  has been authorized for detection and/or diagnosis of SARS-CoV-2 by FDA under an Emergency Use Authorization (EUA). This EUA will remain  in effect (meaning this test can be used) for the duration of the COVID-19 declaration under Section 564(b)(1) of the Act, 21 U.S.C.section 360bbb-3(b)(1), unless the authorization is terminated  or revoked sooner.       Influenza A by PCR NEGATIVE NEGATIVE Final   Influenza B by PCR NEGATIVE NEGATIVE Final    Comment: (NOTE) The Xpert Xpress SARS-CoV-2/FLU/RSV plus assay is intended as an aid in the diagnosis of influenza from Nasopharyngeal swab specimens and should not be used as a sole basis for treatment. Nasal washings and aspirates are unacceptable for Xpert Xpress SARS-CoV-2/FLU/RSV testing.  Fact Sheet for Patients: EntrepreneurPulse.com.au  Fact Sheet for Healthcare Providers: IncredibleEmployment.be  This test is not yet approved or cleared by the [place] FDA and has been authorized for detection and/or diagnosis of SARS-CoV-2 by FDA under an Emergency Use Authorization (EUA). This EUA will remain in effect (meaning this test can be used) for the duration of the COVID-19 declaration under Section 564(b)(1) of the Act, 21 U.S.C. section 360bbb-3(b)(1), unless the authorization is terminated or revoked.  Performed at [facility], [address], [place], [place] [address]   Aerobic/Anaerobic Culture w Gram Stain (surgical/deep wound)     Status: None (Preliminary result)   Collection Time: 09/28/20  2:38 PM   Specimen: Abscess  Result  Value Ref Range Status   Specimen Description   Final    ABSCESS Performed at [facility], [address] [place], [place] [address]    Special Requests   Final    LEFT FOOT Performed at [facility], [address] [place], [place] [address]    Gram Stain   Final    MODERATE SQUAMOUS EPITHELIAL CELLS PRESENT MODERATE WBC PRESENT, PREDOMINANTLY PMN RARE GRAM POSITIVE COCCI IN PAIRS    Culture   Final    TOO YOUNG TO READ Performed at [facility], [place] [address] [place], [place] [address]    Report Status PENDING  Incomplete     Total time spend on discharging this patient, including the last patient exam, discussing the hospital stay, instructions for ongoing care as it relates to all pertinent caregivers, as well as preparing the medical discharge records, prescriptions, and/or referrals as applicable, is 40 minutes.    Enzo Bi, MD  [facility] 09/30/2020, 8:14 AM

## 2020-09-30 NOTE — Progress Notes (Signed)
Inpatient Diabetes Program Recommendations  AACE/ADA: New Consensus Statement on Inpatient Glycemic Control (2015)  Target Ranges:  Prepandial:   less than 140 mg/dL      Peak postprandial:   less than 180 mg/dL (1-2 hours)      Critically ill patients:  140 - 180 mg/dL   Lab Results  Component Value Date   GLUCAP 320 (H) 09/30/2020   HGBA1C 9.1 (H) 03/14/2020    Review of Glycemic Control Results for Leonard Johnston, Leonard Johnston (MRN [id number]) as of 09/30/2020 07:51  Ref. Range 09/30/2020 07:38  Glucose-Capillary Latest Ref Range: 70 - 99 mg/dL 320 (H)    Inpatient Diabetes Program Recommendations:    -Semglee to 30 units QHS. -Novolog 6 units TID with meals if eats at least 50%.  Will continue to follow while inpatient.  Thank you, Reche Dixon, RN, BSN Diabetes Coordinator Inpatient Diabetes Program [phone] (team pager from 8a-5p)

## 2020-09-30 NOTE — Progress Notes (Signed)
Pt discharged home with all instructions provided and questions answered.   BP 132/83 (BP Location: Right Arm)   Pulse 87   Temp 98 F (36.7 C)   Resp 17   Ht '6\' 8"'$  (2.032 m)   Wt 77.1 kg   SpO2 98%   BMI 18.68 kg/m

## 2020-10-02 LAB — CULTURE, BLOOD (ROUTINE X 2)
Culture: NO GROWTH
Culture: NO GROWTH
Special Requests: ADEQUATE
Special Requests: ADEQUATE

## 2020-10-03 LAB — AEROBIC/ANAEROBIC CULTURE W GRAM STAIN (SURGICAL/DEEP WOUND)

## 2020-10-12 ENCOUNTER — Encounter: Payer: Self-pay | Admitting: Podiatry

## 2020-10-12 ENCOUNTER — Other Ambulatory Visit: Payer: Self-pay

## 2020-10-12 ENCOUNTER — Ambulatory Visit: Payer: Medicare HMO | Admitting: Podiatry

## 2020-10-12 DIAGNOSIS — L97402 Non-pressure chronic ulcer of unspecified heel and midfoot with fat layer exposed: Secondary | ICD-10-CM

## 2020-10-12 DIAGNOSIS — E11621 Type 2 diabetes mellitus with foot ulcer: Secondary | ICD-10-CM

## 2020-10-17 ENCOUNTER — Encounter: Payer: Self-pay | Admitting: Podiatry

## 2020-10-17 NOTE — Progress Notes (Signed)
  Subjective:  Patient ID: Leonard Johnston, male    DOB: October 22, 1965,  MRN: [id number]  Chief Complaint  Patient presents with   Diabetic Ulcer    NP diabetic ulcer left foot, A1C  9.1    55 y.o. male presents with the above complaint. History confirmed with patient.  He is here for follow-up from his hospital admission.  Overall is feeling much better.  He has been on antibiotics.  Objective:  Physical Exam: warm, good capillary refill and normal DP and PT pulses.  Absent protective sensation at the plantar ball of the foot and toes.  Full-thickness ulcerations right foot submetatarsal 3 area 1.5 x 1.0 x 0.3 cm left foot ulceration measuring 3.2 x 0.5 x 0.5 cm submetatarsal 1.  Both with granular wound beds.  No signs of infection.  No cellulitis.       Assessment:   1. 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  Ulcer bilateral foot -We discussed the etiology and factors that are a part of the wound healing process.  We also discussed the risk of infection both soft tissue and osteomyelitis from open ulceration.  Discussed the risk of limb loss if this happens or worsens. -Debridement as below. -Dressed with Iodosorb, DSD. -Discussed option of total contact casting which may be difficult for him. -Discussed strict glycemic control he has a continuous glucose monitor now I think this will help quite a lot  Procedure: Excisional Debridement of Wound Rationale: Removal of non-viable soft tissue from the wound to promote healing.  Anesthesia: none Post-Debridement Wound Measurements: right foot submetatarsal 3 area 1.5 x 1.0 x 0.3 cm left foot ulceration measuring 3.2 x 0.5 x 0.5 cm submetatarsal 1 Type of Debridement: Sharp Excisional Tissue Removed: Non-viable soft tissue Depth of Debridement: subcutaneous tissue. Technique:  Sharp excisional debridement to bleeding, viable wound base.  Dressing: Dry, sterile, compression dressing. Disposition: Patient tolerated procedure well.      Return in about 2 weeks (around 10/26/2020) for wound care.

## 2020-10-26 ENCOUNTER — Ambulatory Visit: Payer: Medicare HMO | Admitting: Podiatry

## 2020-11-05 ENCOUNTER — Emergency Department
Admission: EM | Admit: 2020-11-05 | Discharge: 2020-11-05 | Disposition: A | Discharge status: Home / Self Care | Payer: Medicare HMO | Attending: Emergency Medicine | Admitting: Emergency Medicine

## 2020-11-05 ENCOUNTER — Emergency Department: Payer: Medicare HMO

## 2020-11-05 ENCOUNTER — Other Ambulatory Visit: Payer: Self-pay

## 2020-11-05 DIAGNOSIS — L089 Local infection of the skin and subcutaneous tissue, unspecified: Secondary | ICD-10-CM | POA: Diagnosis not present

## 2020-11-05 DIAGNOSIS — E10621 Type 1 diabetes mellitus with foot ulcer: Secondary | ICD-10-CM | POA: Diagnosis not present

## 2020-11-05 DIAGNOSIS — Z8616 Personal history of COVID-19: Secondary | ICD-10-CM | POA: Diagnosis not present

## 2020-11-05 DIAGNOSIS — S90425A Blister (nonthermal), left lesser toe(s), initial encounter: Secondary | ICD-10-CM | POA: Insufficient documentation

## 2020-11-05 DIAGNOSIS — X58XXXA Exposure to other specified factors, initial encounter: Secondary | ICD-10-CM | POA: Insufficient documentation

## 2020-11-05 DIAGNOSIS — Z79899 Other long term (current) drug therapy: Secondary | ICD-10-CM | POA: Diagnosis not present

## 2020-11-05 DIAGNOSIS — Z87891 Personal history of nicotine dependence: Secondary | ICD-10-CM | POA: Diagnosis not present

## 2020-11-05 DIAGNOSIS — L97528 Non-pressure chronic ulcer of other part of left foot with other specified severity: Secondary | ICD-10-CM | POA: Insufficient documentation

## 2020-11-05 DIAGNOSIS — Z794 Long term (current) use of insulin: Secondary | ICD-10-CM | POA: Diagnosis not present

## 2020-11-05 DIAGNOSIS — N1831 Chronic kidney disease, stage 3a: Secondary | ICD-10-CM | POA: Insufficient documentation

## 2020-11-05 DIAGNOSIS — I129 Hypertensive chronic kidney disease with stage 1 through stage 4 chronic kidney disease, or unspecified chronic kidney disease: Secondary | ICD-10-CM | POA: Insufficient documentation

## 2020-11-05 DIAGNOSIS — E1022 Type 1 diabetes mellitus with diabetic chronic kidney disease: Secondary | ICD-10-CM | POA: Insufficient documentation

## 2020-11-05 LAB — CBC
HCT: 42.3 % (ref 39.0–52.0)
Hemoglobin: 14.1 g/dL (ref 13.0–17.0)
MCH: 31.8 pg (ref 26.0–34.0)
MCHC: 33.3 g/dL (ref 30.0–36.0)
MCV: 95.3 fL (ref 80.0–100.0)
Platelets: 173 10*3/uL (ref 150–400)
RBC: 4.44 MIL/uL (ref 4.22–5.81)
RDW: 14.6 % (ref 11.5–15.5)
WBC: 5.9 10*3/uL (ref 4.0–10.5)
nRBC: 0 % (ref 0.0–0.2)

## 2020-11-05 LAB — BASIC METABOLIC PANEL
Anion gap: 6 (ref 5–15)
BUN: 32 mg/dL — ABNORMAL HIGH (ref 6–20)
CO2: 30 mmol/L (ref 22–32)
Calcium: 8.9 mg/dL (ref 8.9–10.3)
Chloride: 104 mmol/L (ref 98–111)
Creatinine, Ser: 1.29 mg/dL — ABNORMAL HIGH (ref 0.61–1.24)
GFR, Estimated: >60 mL/min (ref >60)
Glucose, Bld: 295 mg/dL — ABNORMAL HIGH (ref 70–99)
Potassium: 4.3 mmol/L (ref 3.5–5.1)
Sodium: 140 mmol/L (ref 135–145)

## 2020-11-05 MED ORDER — DOXYCYCLINE HYCLATE 100 MG PO TABS
100.0000 mg | ORAL_TABLET | Freq: Once | ORAL | Status: AC
  Administered 2020-11-05: 100 mg via ORAL
  Filled 2020-11-05: qty 1

## 2020-11-05 MED ORDER — DOXYCYCLINE MONOHYDRATE 100 MG PO TABS: 100.0000 mg | ORAL_TABLET | Freq: Two times a day (BID) | ORAL | 0 refills | Status: AC

## 2020-11-05 NOTE — ED Notes (Signed)
Patient declined discharge vital signs. 

## 2020-11-05 NOTE — ED Provider Notes (Signed)
[facility]  ____________________________________________   Event Date/Time   First MD Initiated Contact with Patient 11/05/20 1203     (approximate)  I have reviewed the triage vital signs and the nursing notes.   HISTORY  Chief Complaint Foot Pain    HPI Leonard Johnston is a 55 y.o. male past medical history of type 1 diabetes who presents with a blister on the left small toe.  Patient was recently hospitalized for diabetic foot infection after walking on the hot sand.  He has been offloading while walking and preferentially walking on the lateral part of his bilateral feet.  Noticed yesterday that his left fifth digit had a blister.  He is concerned about it becoming infected.  He denies fevers or chills or other systemic symptoms.  Blood sugars have been higher than normal in the 300s to 400s.  He does follow in podiatry wound clinic.  Does not have an appointment scheduled at this time.         Past Medical History:  Diagnosis Date   Anxiety    Diabetes mellitus type I ([place])    Diabetes mellitus without complication ([place])    type 1   GERD (gastroesophageal reflux disease)    Hypercholesteremia     Patient Active Problem List   Diagnosis Date Noted   Diabetic foot infection ([place]) 09/27/2020   GERD (gastroesophageal reflux disease) 09/27/2020   Stage 3 chronic kidney disease ([place]) 08/29/2020   Mixed hyperlipidemia 03/29/2020   DKA (diabetic ketoacidosis) ([place]) 03/12/2020   Nausea vomiting and diarrhea 03/12/2020   COVID-19 virus infection 03/12/2020   Chronic anxiety 02/06/2019   Diabetic peripheral neuropathy associated with type 1 diabetes mellitus ([place]) 02/06/2019   Uncontrolled insulin dependent type 1 diabetes mellitus ([place]) 02/06/2019   Acute renal failure superimposed on stage 3a chronic kidney disease ([place]) 09/11/2015   Anxiety 09/11/2015   Essential hypertension 09/11/2015   Pure hypercholesterolemia 09/11/2015   Uncontrolled type 1  diabetes mellitus with ophthalmic complication ([place]) [id number]   Proliferative diabetic retinopathy, both eyes ([place]) 06/19/2012   Retinopathy, diabetic, proliferative ([place]) 06/04/2012   Acute osteomyelitis of ankle or foot ([place]) 12/16/2011   Cellulitis of foot 12/12/2011    Past Surgical History:  Procedure Laterality Date   amputaion of toe     COLONOSCOPY WITH PROPOFOL N/A 06/24/2017   Procedure: COLONOSCOPY WITH PROPOFOL;  Surgeon: Manya Silvas, MD;  Location: [facility] ENDOSCOPY;  Service: Endoscopy;  Laterality: N/A;   ESOPHAGOGASTRODUODENOSCOPY (EGD) WITH PROPOFOL N/A 06/24/2017   Procedure: ESOPHAGOGASTRODUODENOSCOPY (EGD) WITH PROPOFOL;  Surgeon: Manya Silvas, MD;  Location: [facility] ENDOSCOPY;  Service: Endoscopy;  Laterality: N/A;   EYE SURGERY     TOE SURGERY      Prior to Admission medications   Medication Sig Start Date End Date Taking? Authorizing Provider  doxycycline (ADOXA) 100 MG tablet Take 1 tablet (100 mg total) by mouth 2 (two) times daily for 14 days. 11/05/20 11/19/20 Yes Rada Hay, MD  atorvastatin (LIPITOR) 40 MG tablet Take 40 mg by mouth daily. 09/23/20   [provider]  atorvastatin (LIPITOR) 40 MG tablet Take by mouth. 03/29/20   [provider]  BD PEN NEEDLE MICRO U/F 32G X 6 MM MISC 4 (four) times daily. use as directed 12/16/17   [provider]  Continuous Blood Gluc Receiver (DEXCOM G6 RECEIVER) DEVI Use as directed. [facility] [id number] 12/26/17   [provider]  Continuous Blood Gluc Sensor (Binger G6 SENSOR) MISC  Use 1 Device every 10 (ten) days NDC [phone]. Run through Medicare partB 01/01/18   [provider]  Continuous Blood Gluc Transmit (DEXCOM G6 TRANSMITTER) MISC Use as directed, every 3 months. [place] [phone]. Run through Medicare partB 01/01/18   [provider]  famotidine (PEPCID) 40 MG tablet Take 1 tablet by mouth daily.    [provider]  insulin aspart (NOVOLOG)  100 UNIT/ML FlexPen Inject 0-13 Units into the skin 3 (three) times daily with meals. Per sliding scale on smart phone.    [provider]  insulin detemir (LEVEMIR) 100 UNIT/ML FlexPen Inject into the skin. 02/06/19   [provider]  Insulin Glargine (BASAGLAR KWIKPEN) 100 UNIT/ML Inject 30 Units into the skin at bedtime. 09/30/20 12/29/20  Enzo Bi, MD  Multiple Vitamin (MULTI-VITAMINS) TABS Take by mouth.    [provider]  multivitamin-lutein (OCUVITE-LUTEIN) CAPS capsule Take 1 capsule by mouth daily.    [provider]  mupirocin ointment (BACTROBAN) 2 % Apply 1 application topically daily. 09/30/20   Enzo Bi, MD  prazosin (MINIPRESS) 5 MG capsule Take 1 capsule (5 mg total) by mouth at bedtime. For nightmares 01/22/18 10/18/19  Ursula Alert, MD  propranolol (INDERAL) 10 MG tablet Take 1 tablet (10 mg total) by mouth 3 (three) times daily as needed. Only for severe agitation/anxiety 01/22/18 10/18/19  Ursula Alert, MD  ranitidine (ZANTAC) 75 MG tablet Take 75 mg by mouth 2 (two) times daily.  10/18/19  [provider]  sertraline (ZOLOFT) 50 MG tablet Take 1.5 tablets (75 mg total) by mouth daily. 01/22/18 10/18/19  Ursula Alert, MD  traZODone (DESYREL) 50 MG tablet TAKE 1/2 TO 1 (ONE-HALF TO ONE) TABLET BY MOUTH AT BEDTIME AS NEEDED FOR SLEEP 01/22/18 10/18/19  Ursula Alert, MD    Allergies Patient has no known allergies.  Family History  Problem Relation Age of Onset   Alcohol abuse Sister    Drug abuse Sister    Alcohol abuse Sister    Drug abuse Sister     Social History Social History   Tobacco Use   Smoking status: Former    Types: Cigarettes    Quit date: 06/24/2009    Years since quitting: 11.3   Smokeless tobacco: Never  Vaping Use   Vaping Use: Never used  Substance Use Topics   Alcohol use: No   Drug use: No    Review of Systems   Review of Systems  Constitutional:  Negative for chills and fever.   Musculoskeletal:  Negative for arthralgias and myalgias.  Skin:  Positive for color change and wound.  All other systems reviewed and are negative.  Physical Exam Updated Vital Signs BP (!) 144/86 (BP Location: Right Arm)   Pulse 70   Temp 98 F (36.7 C) (Oral)   Resp 15   Ht '6\' 8"'$  (2.032 m)   Wt 77.1 kg   SpO2 97%   BMI 18.68 kg/m   Physical Exam Vitals and nursing note reviewed.  Constitutional:      General: He is not in acute distress.    Appearance: Normal appearance.  HENT:     Head: Normocephalic and atraumatic.  Eyes:     General: No scleral icterus.    Conjunctiva/sclera: Conjunctivae normal.  Pulmonary:     Effort: Pulmonary effort is normal. No respiratory distress.     Breath sounds: Normal breath sounds. No wheezing.  Musculoskeletal:        General: No deformity or signs of  injury.     Cervical back: Normal range of motion.  Skin:    Coloration: Skin is not jaundiced or pale.     Comments: Left fifth digit has skin breakdown on the medial surface with some surrounding erythema, no foul odor or drainage, no crepitus, no extending cellulitis of the foot  Neurological:     General: No focal deficit present.     Mental Status: He is alert and oriented to person, place, and time. Mental status is at baseline.  Psychiatric:        Mood and Affect: Mood normal.        Behavior: Behavior normal.     LABS (all labs ordered are listed, but only abnormal results are displayed)  Labs Reviewed  BASIC METABOLIC PANEL - Abnormal; Notable for the following components:      Result Value   Glucose, Bld 295 (*)    BUN 32 (*)    Creatinine, Ser 1.29 (*)    All other components within normal limits  CBC   ____________________________________________  EKG  N/a ____________________________________________  RADIOLOGY Almeta Monas, personally viewed and evaluated these images (plain radiographs) as part of my medical decision making, as well as reviewing  the written report by the radiologist.  ED MD interpretation: I reviewed the x-ray of the left foot which does not show any evidence of osteomyelitis    ____________________________________________   PROCEDURES  Procedure(s) performed (including Critical Care):  Procedures   ____________________________________________   INITIAL IMPRESSION / ASSESSMENT AND PLAN / ED COURSE     55 year old male type I diabetic presents with a blister on the left toe.  Vital signs within normal limits.  Labs obtained in triage are notable for hyperglycemia but no evidence of DKA, normal anion gap and no leukocytosis.  On exam the left fifth toe does have skin breakdown and a likely developing cellulitis.  I obtained an x-ray which does not show any obvious signs of osteomyelitis.  I feel that given his clinical picture he is appropriate for outpatient antibiotics and podiatry follow-up.  Patient has had MRSA cultured from wounds in the past we will treat with doxycycline for 14 days.  Clinical Course as of 11/05/20 1311  Sat Nov 05, 2020  1204 Glucose(!): 295 [KM]    Clinical Course User Index [KM] Rada Hay, MD     ____________________________________________   FINAL CLINICAL IMPRESSION(S) / ED DIAGNOSES  Final diagnoses:  Foot infection     ED Discharge Orders          Ordered    doxycycline (ADOXA) 100 MG tablet  2 times daily        11/05/20 1309             Note:  This document was prepared using Dragon voice recognition software and may include unintentional dictation errors.    Rada Hay, MD 11/05/20 1311

## 2020-11-05 NOTE — Discharge Instructions (Signed)
You likely have the beginning of infection on the fifth toe of the left foot.  Please take the antibiotic twice daily for the next 2 weeks.  Please follow-up with Dr. Lynnell Catalan with podiatry.  If there is redness that is spreading or you develop fevers or chills, please return to the emergency department.

## 2020-11-05 NOTE — ED Triage Notes (Signed)
Pt to ED for blister to left foot for the past few days. Type 1 diabetic.  Discoloration noted to left pinky toe

## 2020-11-06 DIAGNOSIS — L03032 Cellulitis of left toe: Secondary | ICD-10-CM | POA: Insufficient documentation

## 2021-08-18 ENCOUNTER — Observation Stay: Payer: Medicare HMO

## 2021-08-18 ENCOUNTER — Inpatient Hospital Stay
Admission: EM | Admit: 2021-08-18 | Discharge: 2021-08-25 | DRG: 629 | Disposition: A | Discharge status: Home / Self Care | Payer: Medicare HMO | Attending: Internal Medicine | Admitting: Internal Medicine

## 2021-08-18 ENCOUNTER — Encounter: Payer: Self-pay | Admitting: Emergency Medicine

## 2021-08-18 ENCOUNTER — Other Ambulatory Visit: Payer: Self-pay

## 2021-08-18 DIAGNOSIS — N1831 Chronic kidney disease, stage 3a: Secondary | ICD-10-CM | POA: Diagnosis not present

## 2021-08-18 DIAGNOSIS — E1110 Type 2 diabetes mellitus with ketoacidosis without coma: Secondary | ICD-10-CM | POA: Diagnosis not present

## 2021-08-18 DIAGNOSIS — K219 Gastro-esophageal reflux disease without esophagitis: Secondary | ICD-10-CM | POA: Diagnosis present

## 2021-08-18 DIAGNOSIS — Z87891 Personal history of nicotine dependence: Secondary | ICD-10-CM

## 2021-08-18 DIAGNOSIS — I70202 Unspecified atherosclerosis of native arteries of extremities, left leg: Secondary | ICD-10-CM | POA: Diagnosis present

## 2021-08-18 DIAGNOSIS — I739 Peripheral vascular disease, unspecified: Secondary | ICD-10-CM

## 2021-08-18 DIAGNOSIS — Z794 Long term (current) use of insulin: Secondary | ICD-10-CM

## 2021-08-18 DIAGNOSIS — L97509 Non-pressure chronic ulcer of other part of unspecified foot with unspecified severity: Secondary | ICD-10-CM

## 2021-08-18 DIAGNOSIS — Z89422 Acquired absence of other left toe(s): Secondary | ICD-10-CM

## 2021-08-18 DIAGNOSIS — R197 Diarrhea, unspecified: Secondary | ICD-10-CM

## 2021-08-18 DIAGNOSIS — L03031 Cellulitis of right toe: Secondary | ICD-10-CM | POA: Diagnosis present

## 2021-08-18 DIAGNOSIS — R112 Nausea with vomiting, unspecified: Secondary | ICD-10-CM | POA: Diagnosis present

## 2021-08-18 DIAGNOSIS — E876 Hypokalemia: Secondary | ICD-10-CM | POA: Diagnosis not present

## 2021-08-18 DIAGNOSIS — E1010 Type 1 diabetes mellitus with ketoacidosis without coma: Secondary | ICD-10-CM | POA: Diagnosis not present

## 2021-08-18 DIAGNOSIS — Z8614 Personal history of Methicillin resistant Staphylococcus aureus infection: Secondary | ICD-10-CM

## 2021-08-18 DIAGNOSIS — N179 Acute kidney failure, unspecified: Secondary | ICD-10-CM | POA: Diagnosis not present

## 2021-08-18 DIAGNOSIS — E7800 Pure hypercholesterolemia, unspecified: Secondary | ICD-10-CM | POA: Diagnosis present

## 2021-08-18 DIAGNOSIS — E10621 Type 1 diabetes mellitus with foot ulcer: Secondary | ICD-10-CM | POA: Diagnosis present

## 2021-08-18 DIAGNOSIS — I10 Essential (primary) hypertension: Secondary | ICD-10-CM | POA: Diagnosis present

## 2021-08-18 DIAGNOSIS — E1040 Type 1 diabetes mellitus with diabetic neuropathy, unspecified: Secondary | ICD-10-CM | POA: Diagnosis present

## 2021-08-18 DIAGNOSIS — E11621 Type 2 diabetes mellitus with foot ulcer: Secondary | ICD-10-CM

## 2021-08-18 DIAGNOSIS — E1022 Type 1 diabetes mellitus with diabetic chronic kidney disease: Secondary | ICD-10-CM | POA: Diagnosis present

## 2021-08-18 DIAGNOSIS — I129 Hypertensive chronic kidney disease with stage 1 through stage 4 chronic kidney disease, or unspecified chronic kidney disease: Secondary | ICD-10-CM | POA: Diagnosis present

## 2021-08-18 DIAGNOSIS — L97529 Non-pressure chronic ulcer of other part of left foot with unspecified severity: Secondary | ICD-10-CM | POA: Diagnosis present

## 2021-08-18 DIAGNOSIS — F419 Anxiety disorder, unspecified: Secondary | ICD-10-CM | POA: Diagnosis present

## 2021-08-18 DIAGNOSIS — E1051 Type 1 diabetes mellitus with diabetic peripheral angiopathy without gangrene: Secondary | ICD-10-CM | POA: Diagnosis present

## 2021-08-18 DIAGNOSIS — Z79899 Other long term (current) drug therapy: Secondary | ICD-10-CM

## 2021-08-18 DIAGNOSIS — L03032 Cellulitis of left toe: Secondary | ICD-10-CM | POA: Diagnosis present

## 2021-08-18 DIAGNOSIS — E1069 Type 1 diabetes mellitus with other specified complication: Secondary | ICD-10-CM | POA: Diagnosis present

## 2021-08-18 DIAGNOSIS — M109 Gout, unspecified: Secondary | ICD-10-CM | POA: Diagnosis present

## 2021-08-18 LAB — BETA-HYDROXYBUTYRIC ACID: Beta-Hydroxybutyric Acid: >8 mmol/L — ABNORMAL HIGH (ref 0.05–0.27)

## 2021-08-18 LAB — CBC WITH DIFFERENTIAL/PLATELET
Abs Immature Granulocytes: 0.04 10*3/uL (ref 0.00–0.07)
Basophils Absolute: 0 10*3/uL (ref 0.0–0.1)
Basophils Relative: 0 %
Eosinophils Absolute: 0 10*3/uL (ref 0.0–0.5)
Eosinophils Relative: 0 %
HCT: 45.2 % (ref 39.0–52.0)
Hemoglobin: 14.5 g/dL (ref 13.0–17.0)
Immature Granulocytes: 0 %
Lymphocytes Relative: 11 %
Lymphs Abs: 1 10*3/uL (ref 0.7–4.0)
MCH: 30.3 pg (ref 26.0–34.0)
MCHC: 32.1 g/dL (ref 30.0–36.0)
MCV: 94.6 fL (ref 80.0–100.0)
Monocytes Absolute: 0.5 10*3/uL (ref 0.1–1.0)
Monocytes Relative: 5 %
Neutro Abs: 7.9 10*3/uL — ABNORMAL HIGH (ref 1.7–7.7)
Neutrophils Relative %: 84 %
Platelets: 198 10*3/uL (ref 150–400)
RBC: 4.78 MIL/uL (ref 4.22–5.81)
RDW: 13.3 % (ref 11.5–15.5)
WBC: 9.4 10*3/uL (ref 4.0–10.5)
nRBC: 0 % (ref 0.0–0.2)

## 2021-08-18 LAB — HEPATIC FUNCTION PANEL
ALT: 21 U/L (ref 0–44)
AST: 18 U/L (ref 15–41)
Albumin: 3.9 g/dL (ref 3.5–5.0)
Alkaline Phosphatase: 94 U/L (ref 38–126)
Bilirubin, Direct: <0.1 mg/dL (ref 0.0–0.2)
Total Bilirubin: 1.5 mg/dL — ABNORMAL HIGH (ref 0.3–1.2)
Total Protein: 7.1 g/dL (ref 6.5–8.1)

## 2021-08-18 LAB — GLUCOSE, CAPILLARY: Glucose-Capillary: 313 mg/dL — ABNORMAL HIGH (ref 70–99)

## 2021-08-18 LAB — URINALYSIS, ROUTINE W REFLEX MICROSCOPIC
Bacteria, UA: NONE SEEN
Bilirubin Urine: NEGATIVE
Glucose, UA: >=500 mg/dL — AB
Ketones, ur: 80 mg/dL — AB
Leukocytes,Ua: NEGATIVE
Nitrite: NEGATIVE
Protein, ur: 30 mg/dL — AB
Specific Gravity, Urine: 1.021 (ref 1.005–1.030)
pH: 5 (ref 5.0–8.0)

## 2021-08-18 LAB — CBG MONITORING, ED
Glucose-Capillary: 454 mg/dL — ABNORMAL HIGH (ref 70–99)
Glucose-Capillary: 508 mg/dL (ref 70–99)
Glucose-Capillary: 427 mg/dL — ABNORMAL HIGH (ref 70–99)
Glucose-Capillary: 474 mg/dL — ABNORMAL HIGH (ref 70–99)

## 2021-08-18 LAB — BASIC METABOLIC PANEL
Anion gap: 26 — ABNORMAL HIGH (ref 5–15)
BUN: 51 mg/dL — ABNORMAL HIGH (ref 6–20)
CO2: 9 mmol/L — ABNORMAL LOW (ref 22–32)
Calcium: 8.7 mg/dL — ABNORMAL LOW (ref 8.9–10.3)
Chloride: 102 mmol/L (ref 98–111)
Creatinine, Ser: 1.96 mg/dL — ABNORMAL HIGH (ref 0.61–1.24)
GFR, Estimated: 40 mL/min — ABNORMAL LOW (ref >60)
Glucose, Bld: 516 mg/dL (ref 70–99)
Potassium: 5.2 mmol/L — ABNORMAL HIGH (ref 3.5–5.1)
Sodium: 137 mmol/L (ref 135–145)

## 2021-08-18 LAB — BLOOD GAS, VENOUS
Acid-base deficit: 16.5 mmol/L — ABNORMAL HIGH (ref 0.0–2.0)
Bicarbonate: 9.6 mmol/L — ABNORMAL LOW (ref 20.0–28.0)
O2 Saturation: 90.6 %
Patient temperature: 37
pCO2, Ven: 24 mmHg — ABNORMAL LOW (ref 44–60)
pH, Ven: 7.21 — ABNORMAL LOW (ref 7.25–7.43)
pO2, Ven: 65 mmHg — ABNORMAL HIGH (ref 32–45)

## 2021-08-18 LAB — TROPONIN I (HIGH SENSITIVITY): Troponin I (High Sensitivity): 7 ng/L (ref <18)

## 2021-08-18 LAB — LIPASE, BLOOD: Lipase: 27 U/L (ref 11–51)

## 2021-08-18 MED ORDER — DEXTROSE IN LACTATED RINGERS 5 % IV SOLN: INTRAVENOUS | Status: DC

## 2021-08-18 MED ORDER — LACTATED RINGERS IV BOLUS
20.0000 mL/kg | Freq: Once | INTRAVENOUS | Status: AC
  Administered 2021-08-18: 1588 mL via INTRAVENOUS

## 2021-08-18 MED ORDER — INSULIN REGULAR(HUMAN) IN NACL 100-0.9 UT/100ML-% IV SOLN
INTRAVENOUS | Status: DC
Start: 2021-08-18 — End: 2021-08-19
  Administered 2021-08-18: 8 [IU]/h via INTRAVENOUS

## 2021-08-18 MED ORDER — PANTOPRAZOLE SODIUM 40 MG IV SOLR
40.0000 mg | INTRAVENOUS | Status: DC
  Administered 2021-08-18 – 2021-08-19 (×2): 40 mg via INTRAVENOUS
  Filled 2021-08-18 (×2): qty 10

## 2021-08-18 MED ORDER — DEXTROSE 50 % IV SOLN: 0.0000 mL | INTRAVENOUS | Status: DC | PRN

## 2021-08-18 MED ORDER — LACTATED RINGERS IV BOLUS
1000.0000 mL | Freq: Once | INTRAVENOUS | Status: AC
  Administered 2021-08-18: 1000 mL via INTRAVENOUS

## 2021-08-18 MED ORDER — LACTATED RINGERS IV SOLN: INTRAVENOUS | Status: DC

## 2021-08-18 MED ORDER — ENOXAPARIN SODIUM 40 MG/0.4ML IJ SOSY
40.0000 mg | PREFILLED_SYRINGE | INTRAMUSCULAR | Status: DC
  Administered 2021-08-24: 40 mg via SUBCUTANEOUS
  Filled 2021-08-18 (×6): qty 0.4

## 2021-08-18 MED ORDER — DEXTROSE IN LACTATED RINGERS 5 % IV SOLN
INTRAVENOUS | Status: DC
Start: 2021-08-18 — End: 2021-08-19

## 2021-08-18 MED ORDER — INSULIN REGULAR(HUMAN) IN NACL 100-0.9 UT/100ML-% IV SOLN
INTRAVENOUS | Status: DC
Start: 2021-08-18 — End: 2021-08-18
  Administered 2021-08-18: 8 [IU]/h via INTRAVENOUS
  Filled 2021-08-18: qty 100

## 2021-08-19 DIAGNOSIS — I70245 Atherosclerosis of native arteries of left leg with ulceration of other part of foot: Secondary | ICD-10-CM | POA: Diagnosis not present

## 2021-08-19 DIAGNOSIS — E11621 Type 2 diabetes mellitus with foot ulcer: Secondary | ICD-10-CM | POA: Diagnosis not present

## 2021-08-19 DIAGNOSIS — F419 Anxiety disorder, unspecified: Secondary | ICD-10-CM | POA: Diagnosis present

## 2021-08-19 DIAGNOSIS — Z89422 Acquired absence of other left toe(s): Secondary | ICD-10-CM | POA: Diagnosis not present

## 2021-08-19 DIAGNOSIS — Z87891 Personal history of nicotine dependence: Secondary | ICD-10-CM | POA: Diagnosis not present

## 2021-08-19 DIAGNOSIS — E10621 Type 1 diabetes mellitus with foot ulcer: Secondary | ICD-10-CM | POA: Diagnosis present

## 2021-08-19 DIAGNOSIS — N179 Acute kidney failure, unspecified: Secondary | ICD-10-CM | POA: Diagnosis present

## 2021-08-19 DIAGNOSIS — E1040 Type 1 diabetes mellitus with diabetic neuropathy, unspecified: Secondary | ICD-10-CM | POA: Diagnosis present

## 2021-08-19 DIAGNOSIS — I129 Hypertensive chronic kidney disease with stage 1 through stage 4 chronic kidney disease, or unspecified chronic kidney disease: Secondary | ICD-10-CM | POA: Diagnosis present

## 2021-08-19 DIAGNOSIS — E1069 Type 1 diabetes mellitus with other specified complication: Secondary | ICD-10-CM | POA: Diagnosis present

## 2021-08-19 DIAGNOSIS — L03032 Cellulitis of left toe: Secondary | ICD-10-CM | POA: Diagnosis present

## 2021-08-19 DIAGNOSIS — K219 Gastro-esophageal reflux disease without esophagitis: Secondary | ICD-10-CM

## 2021-08-19 DIAGNOSIS — R112 Nausea with vomiting, unspecified: Secondary | ICD-10-CM | POA: Diagnosis not present

## 2021-08-19 DIAGNOSIS — E7800 Pure hypercholesterolemia, unspecified: Secondary | ICD-10-CM | POA: Diagnosis present

## 2021-08-19 DIAGNOSIS — Z8614 Personal history of Methicillin resistant Staphylococcus aureus infection: Secondary | ICD-10-CM | POA: Diagnosis not present

## 2021-08-19 DIAGNOSIS — I7092 Chronic total occlusion of artery of the extremities: Secondary | ICD-10-CM | POA: Diagnosis not present

## 2021-08-19 DIAGNOSIS — M109 Gout, unspecified: Secondary | ICD-10-CM | POA: Diagnosis present

## 2021-08-19 DIAGNOSIS — L03031 Cellulitis of right toe: Secondary | ICD-10-CM | POA: Diagnosis present

## 2021-08-19 DIAGNOSIS — E1010 Type 1 diabetes mellitus with ketoacidosis without coma: Secondary | ICD-10-CM | POA: Diagnosis present

## 2021-08-19 DIAGNOSIS — E1110 Type 2 diabetes mellitus with ketoacidosis without coma: Secondary | ICD-10-CM | POA: Diagnosis present

## 2021-08-19 DIAGNOSIS — L97509 Non-pressure chronic ulcer of other part of unspecified foot with unspecified severity: Secondary | ICD-10-CM | POA: Diagnosis not present

## 2021-08-19 DIAGNOSIS — I70202 Unspecified atherosclerosis of native arteries of extremities, left leg: Secondary | ICD-10-CM | POA: Diagnosis present

## 2021-08-19 DIAGNOSIS — I10 Essential (primary) hypertension: Secondary | ICD-10-CM | POA: Diagnosis not present

## 2021-08-19 DIAGNOSIS — Z79899 Other long term (current) drug therapy: Secondary | ICD-10-CM | POA: Diagnosis not present

## 2021-08-19 DIAGNOSIS — Z794 Long term (current) use of insulin: Secondary | ICD-10-CM | POA: Diagnosis not present

## 2021-08-19 DIAGNOSIS — E876 Hypokalemia: Secondary | ICD-10-CM | POA: Diagnosis not present

## 2021-08-19 DIAGNOSIS — E1051 Type 1 diabetes mellitus with diabetic peripheral angiopathy without gangrene: Secondary | ICD-10-CM | POA: Diagnosis present

## 2021-08-19 DIAGNOSIS — N1831 Chronic kidney disease, stage 3a: Secondary | ICD-10-CM | POA: Diagnosis present

## 2021-08-19 DIAGNOSIS — L97529 Non-pressure chronic ulcer of other part of left foot with unspecified severity: Secondary | ICD-10-CM | POA: Diagnosis present

## 2021-08-19 DIAGNOSIS — E1022 Type 1 diabetes mellitus with diabetic chronic kidney disease: Secondary | ICD-10-CM | POA: Diagnosis present

## 2021-08-19 LAB — BASIC METABOLIC PANEL
Anion gap: 9 (ref 5–15)
Anion gap: 9 (ref 5–15)
Anion gap: 7 (ref 5–15)
BUN: 44 mg/dL — ABNORMAL HIGH (ref 6–20)
BUN: 41 mg/dL — ABNORMAL HIGH (ref 6–20)
BUN: 35 mg/dL — ABNORMAL HIGH (ref 6–20)
CO2: 22 mmol/L (ref 22–32)
CO2: 21 mmol/L — ABNORMAL LOW (ref 22–32)
CO2: 25 mmol/L (ref 22–32)
Calcium: 8.6 mg/dL — ABNORMAL LOW (ref 8.9–10.3)
Calcium: 8.4 mg/dL — ABNORMAL LOW (ref 8.9–10.3)
Calcium: 8.5 mg/dL — ABNORMAL LOW (ref 8.9–10.3)
Chloride: 110 mmol/L (ref 98–111)
Chloride: 109 mmol/L (ref 98–111)
Chloride: 108 mmol/L (ref 98–111)
Creatinine, Ser: 1.56 mg/dL — ABNORMAL HIGH (ref 0.61–1.24)
Creatinine, Ser: 1.36 mg/dL — ABNORMAL HIGH (ref 0.61–1.24)
Creatinine, Ser: 1.3 mg/dL — ABNORMAL HIGH (ref 0.61–1.24)
GFR, Estimated: 52 mL/min — ABNORMAL LOW (ref >60)
GFR, Estimated: >60 mL/min (ref >60)
GFR, Estimated: >60 mL/min (ref >60)
Glucose, Bld: 216 mg/dL — ABNORMAL HIGH (ref 70–99)
Glucose, Bld: 225 mg/dL — ABNORMAL HIGH (ref 70–99)
Glucose, Bld: 111 mg/dL — ABNORMAL HIGH (ref 70–99)
Potassium: 4.2 mmol/L (ref 3.5–5.1)
Potassium: 4 mmol/L (ref 3.5–5.1)
Potassium: 3.8 mmol/L (ref 3.5–5.1)
Sodium: 141 mmol/L (ref 135–145)
Sodium: 139 mmol/L (ref 135–145)
Sodium: 140 mmol/L (ref 135–145)

## 2021-08-19 LAB — GLUCOSE, CAPILLARY
Glucose-Capillary: 124 mg/dL — ABNORMAL HIGH (ref 70–99)
Glucose-Capillary: 135 mg/dL — ABNORMAL HIGH (ref 70–99)
Glucose-Capillary: 159 mg/dL — ABNORMAL HIGH (ref 70–99)
Glucose-Capillary: 210 mg/dL — ABNORMAL HIGH (ref 70–99)
Glucose-Capillary: 220 mg/dL — ABNORMAL HIGH (ref 70–99)
Glucose-Capillary: 225 mg/dL — ABNORMAL HIGH (ref 70–99)
Glucose-Capillary: 244 mg/dL — ABNORMAL HIGH (ref 70–99)
Glucose-Capillary: 257 mg/dL — ABNORMAL HIGH (ref 70–99)
Glucose-Capillary: 67 mg/dL — ABNORMAL LOW (ref 70–99)
Glucose-Capillary: 84 mg/dL (ref 70–99)
Glucose-Capillary: 94 mg/dL (ref 70–99)

## 2021-08-19 LAB — HIV ANTIBODY (ROUTINE TESTING W REFLEX): HIV Screen 4th Generation wRfx: NONREACTIVE

## 2021-08-19 LAB — BETA-HYDROXYBUTYRIC ACID: Beta-Hydroxybutyric Acid: 4.3 mmol/L — ABNORMAL HIGH (ref 0.05–0.27)

## 2021-08-19 LAB — TROPONIN I (HIGH SENSITIVITY): Troponin I (High Sensitivity): 9 ng/L (ref <18)

## 2021-08-19 MED ORDER — INSULIN GLARGINE-YFGN 100 UNIT/ML ~~LOC~~ SOLN
15.0000 [IU] | Freq: Once | SUBCUTANEOUS | Status: DC
  Filled 2021-08-19: qty 0.15

## 2021-08-19 MED ORDER — INSULIN ASPART 100 UNIT/ML IJ SOLN
0.0000 [IU] | Freq: Three times a day (TID) | INTRAMUSCULAR | Status: DC
  Administered 2021-08-19: 2 [IU] via SUBCUTANEOUS
  Filled 2021-08-19 (×2): qty 1

## 2021-08-19 MED ORDER — ONDANSETRON HCL 4 MG/2ML IJ SOLN
4.0000 mg | Freq: Four times a day (QID) | INTRAMUSCULAR | Status: DC | PRN
  Administered 2021-08-19: 4 mg via INTRAVENOUS
  Filled 2021-08-19: qty 2

## 2021-08-19 MED ORDER — INSULIN GLARGINE-YFGN 100 UNIT/ML ~~LOC~~ SOLN
8.0000 [IU] | Freq: Once | SUBCUTANEOUS | Status: AC
  Administered 2021-08-19: 8 [IU] via SUBCUTANEOUS
  Filled 2021-08-19: qty 0.08

## 2021-08-19 MED ORDER — ACETAMINOPHEN 325 MG PO TABS
650.0000 mg | ORAL_TABLET | Freq: Four times a day (QID) | ORAL | Status: DC | PRN
  Administered 2021-08-19 – 2021-08-20 (×3): 650 mg via ORAL
  Filled 2021-08-19 (×3): qty 2

## 2021-08-19 MED ORDER — INSULIN GLARGINE-YFGN 100 UNIT/ML ~~LOC~~ SOLN
18.0000 [IU] | Freq: Every day | SUBCUTANEOUS | Status: DC
  Filled 2021-08-19: qty 0.18

## 2021-08-19 MED ORDER — INSULIN DETEMIR 100 UNIT/ML ~~LOC~~ SOLN
10.0000 [IU] | Freq: Once | SUBCUTANEOUS | Status: AC
  Administered 2021-08-19: 10 [IU] via SUBCUTANEOUS
  Filled 2021-08-19: qty 0.1

## 2021-08-19 MED ORDER — BLOOD GLUCOSE MONITOR KIT: PACK | 0 refills | Status: AC

## 2021-08-19 MED ORDER — INSULIN ASPART 100 UNIT/ML IJ SOLN
4.0000 [IU] | Freq: Three times a day (TID) | INTRAMUSCULAR | Status: DC
  Filled 2021-08-19: qty 1

## 2021-08-19 MED ORDER — LACTATED RINGERS IV SOLN: INTRAVENOUS | Status: DC

## 2021-08-19 NOTE — Assessment & Plan Note (Addendum)
On protonix

## 2021-08-19 NOTE — Assessment & Plan Note (Addendum)
--  pt will cont home [place] and [place] for now. HE is in the process of getting insulin pump thru his endocrinology--  Hemoglobin A1c at 11.  Patient working on setting up insulin pump at home. --he follows with [facility] endocrinology

## 2021-08-19 NOTE — Progress Notes (Addendum)
1714 BS 67, nurse will give one cup of OJ and recheck within  1743 Repeat BS 84 after x2 Orange Juice, Pt ate 25% holding all evening insulin.  1750 Dr Renae Gloss aware BS and holding insulin. Verbal orders to reach in a couple of hours.

## 2021-08-19 NOTE — Assessment & Plan Note (Addendum)
Cr went from 1.96 down to 0.8. --stable

## 2021-08-19 NOTE — Assessment & Plan Note (Addendum)
--   improved.  Patient is eating better.

## 2021-08-20 DIAGNOSIS — K219 Gastro-esophageal reflux disease without esophagitis: Secondary | ICD-10-CM | POA: Diagnosis not present

## 2021-08-20 DIAGNOSIS — E876 Hypokalemia: Secondary | ICD-10-CM

## 2021-08-20 DIAGNOSIS — E1010 Type 1 diabetes mellitus with ketoacidosis without coma: Secondary | ICD-10-CM | POA: Diagnosis not present

## 2021-08-20 DIAGNOSIS — N179 Acute kidney failure, unspecified: Secondary | ICD-10-CM | POA: Diagnosis not present

## 2021-08-20 DIAGNOSIS — R112 Nausea with vomiting, unspecified: Secondary | ICD-10-CM | POA: Diagnosis not present

## 2021-08-20 LAB — BASIC METABOLIC PANEL
Anion gap: 4 — ABNORMAL LOW (ref 5–15)
BUN: 22 mg/dL — ABNORMAL HIGH (ref 6–20)
CO2: 29 mmol/L (ref 22–32)
Calcium: 8.5 mg/dL — ABNORMAL LOW (ref 8.9–10.3)
Chloride: 105 mmol/L (ref 98–111)
Creatinine, Ser: 1.06 mg/dL (ref 0.61–1.24)
GFR, Estimated: >60 mL/min (ref >60)
Glucose, Bld: 78 mg/dL (ref 70–99)
Potassium: 3.4 mmol/L — ABNORMAL LOW (ref 3.5–5.1)
Sodium: 138 mmol/L (ref 135–145)

## 2021-08-20 LAB — TSH: TSH: 2.068 u[IU]/mL (ref 0.350–4.500)

## 2021-08-20 LAB — GLUCOSE, CAPILLARY
Glucose-Capillary: 186 mg/dL — ABNORMAL HIGH (ref 70–99)
Glucose-Capillary: 323 mg/dL — ABNORMAL HIGH (ref 70–99)
Glucose-Capillary: 266 mg/dL — ABNORMAL HIGH (ref 70–99)
Glucose-Capillary: 269 mg/dL — ABNORMAL HIGH (ref 70–99)

## 2021-08-20 MED ORDER — INSULIN ASPART 100 UNIT/ML IJ SOLN
2.0000 [IU] | Freq: Three times a day (TID) | INTRAMUSCULAR | Status: DC
  Administered 2021-08-20 (×2): 2 [IU] via SUBCUTANEOUS
  Filled 2021-08-20 (×2): qty 1

## 2021-08-20 MED ORDER — INSULIN ASPART 100 UNIT/ML IJ SOLN
0.0000 [IU] | Freq: Every day | INTRAMUSCULAR | Status: DC
  Administered 2021-08-20: 3 [IU] via SUBCUTANEOUS
  Administered 2021-08-21: 4 [IU] via SUBCUTANEOUS
  Filled 2021-08-20 (×2): qty 1

## 2021-08-20 MED ORDER — INSULIN ASPART 100 UNIT/ML IJ SOLN
4.0000 [IU] | Freq: Three times a day (TID) | INTRAMUSCULAR | Status: DC
  Administered 2021-08-20 – 2021-08-22 (×6): 4 [IU] via SUBCUTANEOUS
  Filled 2021-08-20 (×6): qty 1

## 2021-08-20 MED ORDER — PANTOPRAZOLE SODIUM 40 MG PO TBEC
40.0000 mg | DELAYED_RELEASE_TABLET | Freq: Every day | ORAL | Status: DC
  Administered 2021-08-20 – 2021-08-24 (×5): 40 mg via ORAL
  Filled 2021-08-20 (×5): qty 1

## 2021-08-20 MED ORDER — INSULIN ASPART 100 UNIT/ML IJ SOLN
0.0000 [IU] | Freq: Three times a day (TID) | INTRAMUSCULAR | Status: DC
  Administered 2021-08-20: 3 [IU] via SUBCUTANEOUS
  Administered 2021-08-20: 1 [IU] via SUBCUTANEOUS
  Administered 2021-08-20: 4 [IU] via SUBCUTANEOUS
  Administered 2021-08-21: 1 [IU] via SUBCUTANEOUS
  Administered 2021-08-21: 3 [IU] via SUBCUTANEOUS
  Administered 2021-08-22: 2 [IU] via SUBCUTANEOUS
  Filled 2021-08-20 (×6): qty 1

## 2021-08-20 MED ORDER — POTASSIUM CHLORIDE CRYS ER 20 MEQ PO TBCR
40.0000 meq | EXTENDED_RELEASE_TABLET | Freq: Once | ORAL | Status: AC
  Administered 2021-08-20: 40 meq via ORAL
  Filled 2021-08-20: qty 2

## 2021-08-20 MED ORDER — INSULIN GLARGINE-YFGN 100 UNIT/ML ~~LOC~~ SOLN
12.0000 [IU] | Freq: Every day | SUBCUTANEOUS | Status: DC
  Filled 2021-08-20: qty 0.12

## 2021-08-20 MED ORDER — INSULIN GLARGINE-YFGN 100 UNIT/ML ~~LOC~~ SOLN
18.0000 [IU] | Freq: Every day | SUBCUTANEOUS | Status: DC
  Administered 2021-08-20 – 2021-08-21 (×2): 18 [IU] via SUBCUTANEOUS
  Filled 2021-08-20 (×2): qty 0.18

## 2021-08-21 ENCOUNTER — Inpatient Hospital Stay: Payer: Medicare HMO

## 2021-08-21 DIAGNOSIS — L97529 Non-pressure chronic ulcer of other part of left foot with unspecified severity: Secondary | ICD-10-CM | POA: Diagnosis not present

## 2021-08-21 DIAGNOSIS — R112 Nausea with vomiting, unspecified: Secondary | ICD-10-CM | POA: Diagnosis not present

## 2021-08-21 DIAGNOSIS — N179 Acute kidney failure, unspecified: Secondary | ICD-10-CM | POA: Diagnosis not present

## 2021-08-21 DIAGNOSIS — E10621 Type 1 diabetes mellitus with foot ulcer: Secondary | ICD-10-CM

## 2021-08-21 DIAGNOSIS — E11621 Type 2 diabetes mellitus with foot ulcer: Secondary | ICD-10-CM

## 2021-08-21 DIAGNOSIS — E1010 Type 1 diabetes mellitus with ketoacidosis without coma: Secondary | ICD-10-CM | POA: Diagnosis not present

## 2021-08-21 LAB — BASIC METABOLIC PANEL
Anion gap: 6 (ref 5–15)
BUN: 20 mg/dL (ref 6–20)
CO2: 34 mmol/L — ABNORMAL HIGH (ref 22–32)
Calcium: 9.2 mg/dL (ref 8.9–10.3)
Chloride: 99 mmol/L (ref 98–111)
Creatinine, Ser: 0.97 mg/dL (ref 0.61–1.24)
GFR, Estimated: >60 mL/min (ref >60)
Glucose, Bld: 237 mg/dL — ABNORMAL HIGH (ref 70–99)
Potassium: 3.6 mmol/L (ref 3.5–5.1)
Sodium: 139 mmol/L (ref 135–145)

## 2021-08-21 LAB — MRSA NEXT GEN BY PCR, NASAL: MRSA by PCR Next Gen: DETECTED — AB

## 2021-08-21 LAB — HEMOGLOBIN A1C
Hgb A1c MFr Bld: 11 % — ABNORMAL HIGH (ref 4.8–5.6)
Mean Plasma Glucose: 269 mg/dL

## 2021-08-21 LAB — GLUCOSE, CAPILLARY
Glucose-Capillary: 378 mg/dL — ABNORMAL HIGH (ref 70–99)
Glucose-Capillary: 186 mg/dL — ABNORMAL HIGH (ref 70–99)
Glucose-Capillary: 104 mg/dL — ABNORMAL HIGH (ref 70–99)
Glucose-Capillary: 293 mg/dL — ABNORMAL HIGH (ref 70–99)
Glucose-Capillary: 324 mg/dL — ABNORMAL HIGH (ref 70–99)

## 2021-08-21 MED ORDER — ATORVASTATIN CALCIUM 20 MG PO TABS
40.0000 mg | ORAL_TABLET | Freq: Every day | ORAL | Status: DC
  Administered 2021-08-22 – 2021-08-24 (×3): 40 mg via ORAL
  Filled 2021-08-21 (×4): qty 2

## 2021-08-21 MED ORDER — VANCOMYCIN HCL 1500 MG/300ML IV SOLN
1500.0000 mg | Freq: Once | INTRAVENOUS | Status: AC
  Administered 2021-08-21: 1500 mg via INTRAVENOUS
  Filled 2021-08-21: qty 300

## 2021-08-21 MED ORDER — SODIUM CHLORIDE 0.9 % IV SOLN
3.0000 g | Freq: Once | INTRAVENOUS | Status: AC
  Administered 2021-08-21: 3 g via INTRAVENOUS
  Filled 2021-08-21: qty 3

## 2021-08-21 MED ORDER — SODIUM CHLORIDE 0.9 % IV SOLN
3.0000 g | Freq: Four times a day (QID) | INTRAVENOUS | Status: DC
  Administered 2021-08-21 – 2021-08-25 (×14): 3 g via INTRAVENOUS
  Filled 2021-08-21: qty 8
  Filled 2021-08-21: qty 3
  Filled 2021-08-21: qty 8
  Filled 2021-08-21: qty 3
  Filled 2021-08-21 (×2): qty 8
  Filled 2021-08-21: qty 3
  Filled 2021-08-21: qty 8
  Filled 2021-08-21: qty 3
  Filled 2021-08-21 (×7): qty 8
  Filled 2021-08-21: qty 3

## 2021-08-21 MED ORDER — CADEXOMER IODINE 0.9 % EX GEL
CUTANEOUS | Status: AC
  Filled 2021-08-21: qty 10

## 2021-08-21 MED ORDER — VANCOMYCIN HCL 750 MG/150ML IV SOLN
750.0000 mg | Freq: Two times a day (BID) | INTRAVENOUS | Status: DC
  Administered 2021-08-22: 750 mg via INTRAVENOUS
  Filled 2021-08-21 (×2): qty 150

## 2021-08-21 MED ORDER — BACITRACIN ZINC 500 UNIT/GM EX OINT
TOPICAL_OINTMENT | Freq: Two times a day (BID) | CUTANEOUS | Status: AC
  Filled 2021-08-21 (×2): qty 0.9

## 2021-08-21 MED ORDER — LISINOPRIL 10 MG PO TABS
10.0000 mg | ORAL_TABLET | Freq: Every day | ORAL | Status: DC
Start: 2021-08-21 — End: 2021-08-25
  Administered 2021-08-21 – 2021-08-25 (×5): 10 mg via ORAL
  Filled 2021-08-21 (×5): qty 1

## 2021-08-21 NOTE — Consult Note (Addendum)
Pharmacy Antibiotic Note  Leonard Johnston is a 56 y.o. male admitted on 08/18/2021 with DKA and developed a wound on his foot. Pharmacy has been consulted for Unasyn and Vancomycin dosing. Pt has a history of MRSA infection. Positive abscess culture (09/2020), for streptococcus agalactiae and MRSA.   Plan: Will start Unasyn 3g IV every 6 hours  Pt received vancomycin 1500 mg x 1 loading dose. Will order vancomycin 750 mg q12H. Predicted AUC of 424. Goal AUC 400-550. Scr 0.97, Vd 0.72, TBW. Plan to get vancomycin level after 4th or 5th dose.   Height: 6\' 8"  (203.2 cm) Weight: 67.6 kg (149 lb 0.5 oz) IBW/kg (Calculated) : 96  Temp (24hrs), Avg:98.2 F (36.8 C), Min:97.9 F (36.6 C), Max:98.3 F (36.8 C)  Recent Labs  Lab 08/18/21 1920 08/19/21 0156 08/19/21 0421 08/19/21 0808 08/20/21 0420 08/21/21 0427  WBC 9.4  --   --   --   --   --   CREATININE 1.96* 1.56* 1.36* 1.30* 1.06 0.97    Estimated Creatinine Clearance: 82.3 mL/min (by C-G formula based on SCr of 0.97 mg/dL).    No Known Allergies  Antimicrobials this admission: Vancomycin 6/26 >> Unasyn 6/26 >>   Dose adjustments this admission: N/A  Microbiology results:  None   Thank you for allowing pharmacy to be a part of this patient's care.  Paschal Dopp, PharmD  08/21/2021 11:27 AM

## 2021-08-21 NOTE — Consult Note (Signed)
Reason for Consult: Ulcer of left great toe Referring Physician: Alford Highland MD  Leonard Johnston is an 56 y.o. male.  HPI: No neuropraxis from previous admission for prior fifth toe infection.  He previously has undergone surgery since that time for partial fifth ray amputation as well as what sounds like a partial debridement of the tibial sesamoid.  Says he still has gout in the big toe joint.  Did not know he had a wound on the outside of the left great toe which showed up recently  Past Medical History:  Diagnosis Date   Anxiety    Diabetes mellitus type I (HCC)    Diabetes mellitus without complication (HCC)    type 1   GERD (gastroesophageal reflux disease)    Hypercholesteremia     Past Surgical History:  Procedure Laterality Date   amputaion of toe     COLONOSCOPY WITH PROPOFOL N/A 06/24/2017   Procedure: COLONOSCOPY WITH PROPOFOL;  Surgeon: Scot Jun, MD;  Location: [facility] ENDOSCOPY;  Service: Endoscopy;  Laterality: N/A;   ESOPHAGOGASTRODUODENOSCOPY (EGD) WITH PROPOFOL N/A 06/24/2017   Procedure: ESOPHAGOGASTRODUODENOSCOPY (EGD) WITH PROPOFOL;  Surgeon: Scot Jun, MD;  Location: [facility] ENDOSCOPY;  Service: Endoscopy;  Laterality: N/A;   EYE SURGERY     TOE SURGERY      Family History  Problem Relation Age of Onset   Alcohol abuse Sister    Drug abuse Sister    Alcohol abuse Sister    Drug abuse Sister     Social History:  reports that he quit smoking about 12 years ago. He has never used smokeless tobacco. He reports that he does not drink alcohol and does not use drugs.  Allergies: No Known Allergies  Medications: I have reviewed the patient's current medications.  Results for orders placed or performed during the hospital encounter of 08/18/21 (from the past 48 hour(s))  Glucose, capillary     Status: Abnormal   Collection Time: 08/19/21  8:54 PM  Result Value Ref Range   Glucose-Capillary 124 (H) 70 - 99 mg/dL    Comment: Glucose reference  range applies only to samples taken after fasting for at least 8 hours.  Basic metabolic panel     Status: Abnormal   Collection Time: 08/20/21  4:20 AM  Result Value Ref Range   Sodium 138 135 - 145 mmol/L   Potassium 3.4 (L) 3.5 - 5.1 mmol/L   Chloride 105 98 - 111 mmol/L   CO2 29 22 - 32 mmol/L   Glucose, Bld 78 70 - 99 mg/dL    Comment: Glucose reference range applies only to samples taken after fasting for at least 8 hours.   BUN 22 (H) 6 - 20 mg/dL   Creatinine, Ser 8.29 0.61 - 1.24 mg/dL   Calcium 8.5 (L) 8.9 - 10.3 mg/dL   GFR, Estimated >56 >21 mL/min    Comment: (NOTE) Calculated using the CKD-EPI Creatinine Equation (2021)    Anion gap 4 (L) 5 - 15    Comment: Performed at [facility], [address] [place], [place] [address]  TSH     Status: None   Collection Time: 08/20/21  4:20 AM  Result Value Ref Range   TSH 2.068 0.350 - 4.500 uIU/mL    Comment: Performed by a 3rd Generation assay with a functional sensitivity of <=0.01 uIU/mL. Performed at [facility], [address] [place], [place] [address]   Glucose, capillary     Status: Abnormal  Collection Time: 08/20/21  8:35 AM  Result Value Ref Range   Glucose-Capillary 186 (H) 70 - 99 mg/dL    Comment: Glucose reference range applies only to samples taken after fasting for at least 8 hours.  Glucose, capillary     Status: Abnormal   Collection Time: 08/20/21 11:10 AM  Result Value Ref Range   Glucose-Capillary 323 (H) 70 - 99 mg/dL    Comment: Glucose reference range applies only to samples taken after fasting for at least 8 hours.  Glucose, capillary     Status: Abnormal   Collection Time: 08/20/21  4:44 PM  Result Value Ref Range   Glucose-Capillary 266 (H) 70 - 99 mg/dL    Comment: Glucose reference range applies only to samples taken after fasting for at least 8 hours.  Glucose, capillary     Status: Abnormal   Collection Time: 08/20/21  9:04 PM  Result Value Ref Range    Glucose-Capillary 269 (H) 70 - 99 mg/dL    Comment: Glucose reference range applies only to samples taken after fasting for at least 8 hours.  Basic metabolic panel     Status: Abnormal   Collection Time: 08/21/21  4:27 AM  Result Value Ref Range   Sodium 139 135 - 145 mmol/L   Potassium 3.6 3.5 - 5.1 mmol/L   Chloride 99 98 - 111 mmol/L   CO2 34 (H) 22 - 32 mmol/L   Glucose, Bld 237 (H) 70 - 99 mg/dL    Comment: Glucose reference range applies only to samples taken after fasting for at least 8 hours.   BUN 20 6 - 20 mg/dL   Creatinine, Ser 5.62 0.61 - 1.24 mg/dL   Calcium 9.2 8.9 - 13.0 mg/dL   GFR, Estimated >86 >57 mL/min    Comment: (NOTE) Calculated using the CKD-EPI Creatinine Equation (2021)    Anion gap 6 5 - 15    Comment: Performed at [facility], [address] [place], [place] [address]  Glucose, capillary     Status: Abnormal   Collection Time: 08/21/21  7:48 AM  Result Value Ref Range   Glucose-Capillary 186 (H) 70 - 99 mg/dL    Comment: Glucose reference range applies only to samples taken after fasting for at least 8 hours.  Glucose, capillary     Status: Abnormal   Collection Time: 08/21/21 11:23 AM  Result Value Ref Range   Glucose-Capillary 104 (H) 70 - 99 mg/dL    Comment: Glucose reference range applies only to samples taken after fasting for at least 8 hours.  MRSA Next Gen by PCR, Nasal     Status: Abnormal   Collection Time: 08/21/21  3:20 PM   Specimen: Nasal Mucosa; Nasal Swab  Result Value Ref Range   MRSA by PCR Next Gen DETECTED (A) NOT DETECTED    Comment: RESULT CALLED TO, READ BACK BY AND VERIFIED WITH: CRYSTAL BURNETT 08/21/21 1649 MU (NOTE) The GeneXpert MRSA Assay (FDA approved for NASAL specimens only), is one component of a comprehensive MRSA colonization surveillance program. It is not intended to diagnose MRSA infection nor to guide or monitor treatment for MRSA infections. Test performance is not FDA approved in patients  less than 73 years old. Performed at [facility], [address] [place], [place] [address]   Glucose, capillary     Status: Abnormal   Collection Time: 08/21/21  4:40 PM  Result Value Ref Range   Glucose-Capillary 293 (H) 70 - 99 mg/dL  Comment: Glucose reference range applies only to samples taken after fasting for at least 8 hours.    MR FOOT LEFT WO CONTRAST  Result Date: 08/21/2021 CLINICAL DATA:  Foot swelling, diabetic, osteomyelitis suspected, xray done EXAM: MRI OF THE LEFT FOOT WITHOUT CONTRAST TECHNIQUE: Multiplanar, multisequence MR imaging of the left forefoot was performed. No intravenous contrast was administered. COMPARISON:  Left foot MRI 09/27/2020. FINDINGS: Bones/Joint/Cartilage There is severe first metatarsophalangeal joint osteoarthritis, with mild subchondral marrow edema and hallux valgus deformity. There is progressive fragmentation of the medial great toe sesamoid with marrow edema and low T1 signal. Milder signal abnormality in the lateral sesamoid. Unchanged undersurface erosion at the base of the first metatarsal head and marginal marrow edema signal (series 8, image 23). Preserved T1 marrow signal in the great toe phalanges. 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 There is no other significant marrow signal alteration. Ligaments Intact Lisfranc ligament. Muscles and Tendons No acute tendon tear. There is diffuse intramuscular edema and atrophy in the foot as is commonly seen in diabetics. Soft tissues Mild soft tissue swelling of the foot and more focally along the great toe. There is susceptibility artifact along the medial soft tissues (series 5, images 23-24), without foreign body visualized radiographically. Focal callus formation along the plantar aspect of the second metatarsal head. IMPRESSION: Progressive fragmentation of the medial great toe sesamoid with marrow signal abnormality, mild marrow edema of the lateral  sesamoid, and adjacent marrow edema along the plantar surface of the first metatarsal head with unchanged undersurface erosion. 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 Medial forefoot susceptibility artifact and soft tissue swelling, suspicious for the presence of an ulcer and possible foreign body, though there is no radiopaque foreign body identified on same day radiograph. Correlate with exam. No evidence of soft tissue abscess. 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 Electronically Signed   By: Caprice Renshaw M.D.   On: 08/21/2021 14:48   DG Foot 2 Views Left  Result Date: 08/21/2021 CLINICAL DATA:  Diabetic left great toe ulcer EXAM: LEFT FOOT - 2 VIEW COMPARISON:  09/27/2020 FINDINGS: Interval transmetatarsal resection of the fifth digit. Amputation of the second digit again seen. Advanced degenerative changes of the first metatarsal phalangeal joint again seen. Soft tissue swelling noted along the plantar aspect of the forefoot. A small erosion is suspected in the anterior aspect of the sesamoid underlying the region of soft tissue swelling. IMPRESSION: 1. Soft tissue swelling of the plantar forefoot with small erosion of the underlying sesamoid suspicious for osteomyelitis. 2. Postsurgical changes of second and fifth toe amputation are seen. Electronically Signed   By: Acquanetta Belling M.D.   On: 08/21/2021 09:21    ROS Blood pressure 116/71, pulse 89, temperature 99 F (37.2 C), resp. rate 18, height 6\' 8"  (2.032 m), weight 67.6 kg, SpO2 99 %.  Vitals:   08/21/21 0820 08/21/21 1635  BP: (!) 151/95 116/71  Pulse: 86 89  Resp: 18 18  Temp: 98.3 F (36.8 C) 99 F (37.2 C)  SpO2: 99% 99%    General AA&O x3. Normal mood and affect.  Vascular Dorsalis  pedis and posterior tibial pulses  present 2+ left  Capillary refill normal to all digits. Pedal hair growth normal.  Neurologic Epicritic  sensation grossly absent.  Dermatologic (Wound) Ulceration pulp of the lateral great toe with minor area of necrotic patch, serous drainage, does not probe deep to bone, there is cellulitis to the base of the toe  Orthopedic: Motor intact BLE.      Assessment/Plan:  Left great toe infection -Imaging: Studies independently reviewed.  The concerning areas of gout correspond to an area that he previously had partial sesamoidectomy and I&D of the great toe joint.  These are likely postsurgical changes and sequela of his severe gout as opposed to new osteomyelitis.  The area around the MTPJ appears to be free of infection there is no ulceration swelling or erythema here.  He does have an ulceration but this is on the opposite side of the great toe, this is also removed with a questionable foreign body which I suspect is likely artifact from suture as this corresponds to the area of scar -Antibiotics: Continue broad-spectrum -WB Status: WBAT -Wound Care: Iodosorb and gauze change daily -Surgical Plan: We will reevaluate tomorrow, if he continues to be persistently cellulitic may require I&D  Edwin Cap 08/21/2021, 6:01 PM   Best available via secure chat for questions or concerns.

## 2021-08-21 NOTE — Assessment & Plan Note (Addendum)
--  On the inside of his first toe he does have a ulceration.  First toe slight erythema.   --X-ray shows some soft tissue swelling of the plantar forefoot with small erosion of the underlying sesamoid suspicious for osteomyelitis. --  MRI commented on possible osteomyelitis of the sesamoid bones and first phalanx.  -- Vancomycin and Unasyn started on 08/21/2021 and ok to change to po doxycycline per Dr Posey Pronto per podiatry.  Podiatry does not think that this is osteomyelitis.

## 2021-08-22 ENCOUNTER — Inpatient Hospital Stay: Payer: Medicare HMO

## 2021-08-22 DIAGNOSIS — E10621 Type 1 diabetes mellitus with foot ulcer: Secondary | ICD-10-CM | POA: Diagnosis not present

## 2021-08-22 DIAGNOSIS — N179 Acute kidney failure, unspecified: Secondary | ICD-10-CM | POA: Diagnosis not present

## 2021-08-22 DIAGNOSIS — E1010 Type 1 diabetes mellitus with ketoacidosis without coma: Secondary | ICD-10-CM | POA: Diagnosis not present

## 2021-08-22 DIAGNOSIS — L97509 Non-pressure chronic ulcer of other part of unspecified foot with unspecified severity: Secondary | ICD-10-CM

## 2021-08-22 DIAGNOSIS — R112 Nausea with vomiting, unspecified: Secondary | ICD-10-CM | POA: Diagnosis not present

## 2021-08-22 LAB — GLUCOSE, CAPILLARY
Glucose-Capillary: 226 mg/dL — ABNORMAL HIGH (ref 70–99)
Glucose-Capillary: 199 mg/dL — ABNORMAL HIGH (ref 70–99)
Glucose-Capillary: 183 mg/dL — ABNORMAL HIGH (ref 70–99)
Glucose-Capillary: 149 mg/dL — ABNORMAL HIGH (ref 70–99)

## 2021-08-22 LAB — CBC
HCT: 41.4 % (ref 39.0–52.0)
Hemoglobin: 13.6 g/dL (ref 13.0–17.0)
MCH: 30.4 pg (ref 26.0–34.0)
MCHC: 32.9 g/dL (ref 30.0–36.0)
MCV: 92.4 fL (ref 80.0–100.0)
Platelets: 135 10*3/uL — ABNORMAL LOW (ref 150–400)
RBC: 4.48 MIL/uL (ref 4.22–5.81)
RDW: 13.2 % (ref 11.5–15.5)
WBC: 4.3 10*3/uL (ref 4.0–10.5)
nRBC: 0 % (ref 0.0–0.2)

## 2021-08-22 LAB — BASIC METABOLIC PANEL
Anion gap: 6 (ref 5–15)
BUN: 24 mg/dL — ABNORMAL HIGH (ref 6–20)
CO2: 30 mmol/L (ref 22–32)
Calcium: 8.6 mg/dL — ABNORMAL LOW (ref 8.9–10.3)
Chloride: 103 mmol/L (ref 98–111)
Creatinine, Ser: 0.8 mg/dL (ref 0.61–1.24)
GFR, Estimated: >60 mL/min (ref >60)
Glucose, Bld: 336 mg/dL — ABNORMAL HIGH (ref 70–99)
Potassium: 3.9 mmol/L (ref 3.5–5.1)
Sodium: 139 mmol/L (ref 135–145)

## 2021-08-22 MED ORDER — VANCOMYCIN HCL IN DEXTROSE 1-5 GM/200ML-% IV SOLN
1000.0000 mg | Freq: Two times a day (BID) | INTRAVENOUS | Status: DC
  Administered 2021-08-22 – 2021-08-24 (×4): 1000 mg via INTRAVENOUS
  Filled 2021-08-22 (×6): qty 200

## 2021-08-22 MED ORDER — INSULIN GLARGINE-YFGN 100 UNIT/ML ~~LOC~~ SOLN
22.0000 [IU] | Freq: Every day | SUBCUTANEOUS | Status: DC
Start: 2021-08-22 — End: 2021-08-23
  Administered 2021-08-22: 22 [IU] via SUBCUTANEOUS
  Filled 2021-08-22: qty 0.22

## 2021-08-22 MED ORDER — INSULIN ASPART 100 UNIT/ML IJ SOLN: 0.0000 [IU] | Freq: Every day | INTRAMUSCULAR | Status: DC

## 2021-08-22 MED ORDER — INSULIN GLARGINE-YFGN 100 UNIT/ML ~~LOC~~ SOLN
20.0000 [IU] | Freq: Every day | SUBCUTANEOUS | Status: DC
Start: 2021-08-22 — End: 2021-08-22
  Filled 2021-08-22: qty 0.2

## 2021-08-22 MED ORDER — INSULIN ASPART 100 UNIT/ML IJ SOLN
0.0000 [IU] | Freq: Three times a day (TID) | INTRAMUSCULAR | Status: DC
  Administered 2021-08-22 (×2): 3 [IU] via SUBCUTANEOUS
  Administered 2021-08-23: 2 [IU] via SUBCUTANEOUS
  Administered 2021-08-24: 11 [IU] via SUBCUTANEOUS
  Administered 2021-08-24: 8 [IU] via SUBCUTANEOUS
  Administered 2021-08-25: 11 [IU] via SUBCUTANEOUS
  Filled 2021-08-22 (×6): qty 1

## 2021-08-22 NOTE — Assessment & Plan Note (Signed)
Continue lisinopril 10 mg daily. 

## 2021-08-22 NOTE — Hospital Course (Addendum)
56 year old man with past medical history of type 1 diabetes mellitus, GERD, hyperlipidemia and anxiety.  The patient was initially admitted to the hospital on 08/18/2021 with diabetic ketoacidosis.  He was switched off the insulin drip but was having trouble eating for couple days.  He was noted to have a diabetic foot ulcer on 08/21/2021.  X-ray and MRI were obtained.  Podiatry does not think that this is an osteomyelitis but wanted to watch on antibiotics for couple days.

## 2021-08-23 DIAGNOSIS — E1010 Type 1 diabetes mellitus with ketoacidosis without coma: Secondary | ICD-10-CM | POA: Diagnosis not present

## 2021-08-23 DIAGNOSIS — I10 Essential (primary) hypertension: Secondary | ICD-10-CM | POA: Diagnosis not present

## 2021-08-23 DIAGNOSIS — E11621 Type 2 diabetes mellitus with foot ulcer: Secondary | ICD-10-CM

## 2021-08-23 DIAGNOSIS — N179 Acute kidney failure, unspecified: Secondary | ICD-10-CM | POA: Diagnosis not present

## 2021-08-23 DIAGNOSIS — E10621 Type 1 diabetes mellitus with foot ulcer: Secondary | ICD-10-CM | POA: Diagnosis not present

## 2021-08-23 DIAGNOSIS — I739 Peripheral vascular disease, unspecified: Secondary | ICD-10-CM

## 2021-08-23 LAB — GLUCOSE, CAPILLARY
Glucose-Capillary: 144 mg/dL — ABNORMAL HIGH (ref 70–99)
Glucose-Capillary: 88 mg/dL (ref 70–99)
Glucose-Capillary: 97 mg/dL (ref 70–99)
Glucose-Capillary: 93 mg/dL (ref 70–99)

## 2021-08-23 MED ORDER — MUPIROCIN 2 % EX OINT
1.0000 | TOPICAL_OINTMENT | Freq: Two times a day (BID) | CUTANEOUS | Status: DC
  Administered 2021-08-23 – 2021-08-24 (×3): 1 via NASAL
  Filled 2021-08-23 (×2): qty 22

## 2021-08-23 MED ORDER — INSULIN GLARGINE-YFGN 100 UNIT/ML ~~LOC~~ SOLN
25.0000 [IU] | Freq: Every day | SUBCUTANEOUS | Status: DC
  Administered 2021-08-23: 25 [IU] via SUBCUTANEOUS
  Filled 2021-08-23: qty 0.25

## 2021-08-23 MED ORDER — INSULIN ASPART 100 UNIT/ML IJ SOLN
5.0000 [IU] | Freq: Three times a day (TID) | INTRAMUSCULAR | Status: DC
Start: 2021-08-23 — End: 2021-08-25
  Administered 2021-08-23 – 2021-08-24 (×5): 5 [IU] via SUBCUTANEOUS
  Filled 2021-08-23 (×7): qty 1

## 2021-08-23 NOTE — H&P (View-Only) (Signed)
[facility] Consult Note  MRN : [id number]  Leonard Johnston is a 56 y.o. (May 02, 1965) male who presents with chief complaint of  Chief Complaint  Patient presents with   Hyperglycemia  .   Consulting Physician: Fritzi Mandes, MD  Reason for consult: Diabetic foot wound History of Present Illness: Leonard Johnston is a 56 year old male who presented to [facility] on 08/18/2021 with diabetic ketoacidosis.  He has a history of type 1 diabetes mellitus, hyperlipidemia, GERD and PAD.  It was noted that on 08/21/2021 he had a diabetic foot ulcer to the left great toe.  The wound is probable to the bone.  Currently podiatry does not feel like there is osteomyelitis present but it was noted that there was little blood during the bedside debridement.  He underwent noninvasive studies which showed decreased ABIs on the left side of 0.76.  Current Facility-Administered Medications  Medication Dose Route Frequency Provider Last Rate Last Admin   acetaminophen (TYLENOL) tablet 650 mg  650 mg Oral Q6H PRN Loletha Grayer, MD   650 mg at 08/20/21 1652   Ampicillin-Sulbactam (UNASYN) 3 g in sodium chloride 0.9 % 100 mL IVPB  3 g Intravenous Q6H Oswald Hillock, RPH 200 mL/hr at 08/23/21 1432 3 g at 08/23/21 1432   atorvastatin (LIPITOR) tablet 40 mg  40 mg Oral Daily Oswald Hillock, RPH   40 mg at 08/22/21 2111   dextrose 50 % solution 0-50 mL  0-50 mL Intravenous PRN [name], MD       enoxaparin (LOVENOX) injection 40 mg  40 mg Subcutaneous Q24H [name], MD       insulin aspart (novoLOG) injection 0-15 Units  0-15 Units Subcutaneous TID WC Loletha Grayer, MD   2 Units at 08/23/21 0859   insulin aspart (novoLOG) injection 0-5 Units  0-5 Units Subcutaneous QHS [name], [name], MD       insulin aspart (novoLOG) injection 5 Units  5 Units Subcutaneous TID WC Fritzi Mandes, MD   5 Units at 08/23/21 1256   insulin glargine-yfgn (SEMGLEE)  injection 25 Units  25 Units Subcutaneous QHS Fritzi Mandes, MD       lisinopril (ZESTRIL) tablet 10 mg  10 mg Oral Daily Loletha Grayer, MD   10 mg at 08/23/21 0935   ondansetron (ZOFRAN) injection 4 mg  4 mg Intravenous Q6H PRN Loletha Grayer, MD   4 mg at 08/19/21 1258   pantoprazole (PROTONIX) EC tablet 40 mg  40 mg Oral QHS Benita Gutter, RPH   40 mg at 08/22/21 2112   vancomycin (VANCOCIN) IVPB 1000 mg/200 mL premix  1,000 mg Intravenous Q12H Oswald Hillock, RPH 200 mL/hr at 08/23/21 1256 1,000 mg at 08/23/21 1256    Past Medical History:  Diagnosis Date   Anxiety    Diabetes mellitus type I ([place])    Diabetes mellitus without complication ([place])    type 1   GERD (gastroesophageal reflux disease)    Hypercholesteremia     Past Surgical History:  Procedure Laterality Date   amputaion of toe     COLONOSCOPY WITH PROPOFOL N/A 06/24/2017   Procedure: COLONOSCOPY WITH PROPOFOL;  Surgeon: Manya Silvas, MD;  Location: [facility] ENDOSCOPY;  Service: Endoscopy;  Laterality: N/A;   ESOPHAGOGASTRODUODENOSCOPY (EGD) WITH PROPOFOL N/A 06/24/2017   Procedure: ESOPHAGOGASTRODUODENOSCOPY (EGD) WITH PROPOFOL;  Surgeon: Manya Silvas, MD;  Location: [facility] ENDOSCOPY;  Service: Endoscopy;  Laterality: N/A;   EYE SURGERY  TOE SURGERY      Social History Social History   Tobacco Use   Smoking status: Former    Types: Cigarettes    Quit date: 06/24/2009    Years since quitting: 12.1   Smokeless tobacco: Never  Vaping Use   Vaping Use: Never used  Substance Use Topics   Alcohol use: No   Drug use: No    Family History Family History  Problem Relation Age of Onset   Alcohol abuse Sister    Drug abuse Sister    Alcohol abuse Sister    Drug abuse Sister     No Known Allergies   REVIEW OF SYSTEMS (Negative unless checked)  Constitutional: '[]'$ Weight loss  '[]'$ Fever  '[]'$ Chills Cardiac: '[]'$ Chest pain   '[]'$ Chest pressure   '[]'$ Palpitations   '[]'$ Shortness of breath when laying flat    '[]'$ Shortness of breath at rest   '[]'$ Shortness of breath with exertion. Vascular:  '[]'$ Pain in legs with walking   '[]'$ Pain in legs at rest   '[]'$ Pain in legs when laying flat   '[]'$ Claudication   '[]'$ Pain in feet when walking  '[]'$ Pain in feet at rest  '[]'$ Pain in feet when laying flat   '[]'$ History of DVT   '[]'$ Phlebitis   '[]'$ Swelling in legs   '[]'$ Varicose veins   '[]'$ Non-healing ulcers Pulmonary:   '[]'$ Uses home oxygen   '[]'$ Productive cough   '[]'$ Hemoptysis   '[]'$ Wheeze  '[]'$ COPD   '[]'$ Asthma Neurologic:  '[]'$ Dizziness  '[]'$ Blackouts   '[]'$ Seizures   '[]'$ History of stroke   '[]'$ History of TIA  '[]'$ Aphasia   '[]'$ Temporary blindness   '[]'$ Dysphagia   '[]'$ Weakness or numbness in arms   '[]'$ Weakness or numbness in legs Musculoskeletal:  '[]'$ Arthritis   '[]'$ Joint swelling   '[]'$ Joint pain   '[]'$ Low back pain Hematologic:  '[]'$ Easy bruising  '[]'$ Easy bleeding   '[]'$ Hypercoagulable state   '[]'$ Anemic  '[]'$ Hepatitis Gastrointestinal:  '[]'$ Blood in stool   '[]'$ Vomiting blood  '[]'$ Gastroesophageal reflux/heartburn   '[]'$ Difficulty swallowing. Genitourinary:  '[]'$ Chronic kidney disease   '[]'$ Difficult urination  '[]'$ Frequent urination  '[]'$ Burning with urination   '[]'$ Blood in urine Skin:  '[]'$ Rashes   '[]'$ Ulcers   '[x]'$ Wounds Psychological:  '[]'$ History of anxiety   '[]'$  History of major depression.  Physical Examination  Vitals:   08/22/21 1145 08/22/21 2016 08/23/21 0536 08/23/21 0755  BP: 128/78 117/64 102/64 136/83  Pulse: 91 87 85 82  Resp: '16 17 17 16  '$ Temp: 98.4 F (36.9 C) 98.4 F (36.9 C) 99.2 F (37.3 C) 98.3 F (36.8 C)  TempSrc: Oral     SpO2: 100% 100% 94% 98%  Weight:      Height:       Body mass index is 16.37 kg/m. Gen:  WD/WN, NAD Head: [place]/AT, No temporalis wasting. Prominent temp pulse not noted. Ear/Nose/Throat: Hearing grossly intact, nares w/o erythema or drainage, oropharynx w/o Erythema/Exudate Eyes: Sclera non-icteric, conjunctiva clear Neck: Trachea midline.  No JVD.  Pulmonary:  Good air movement, respirations not labored, equal bilaterally.  Cardiac: RRR,  normal S1, S2. Vascular: Nonpalpable pulses bilaterally  Gastrointestinal: soft, non-tender/non-distended. No guarding/reflex.  Musculoskeletal: M/S 5/5 throughout.  Extremities without ischemic changes.  No deformity or atrophy. No edema. Neurologic: Sensation grossly intact in extremities.  Symmetrical.  Speech is fluent. Motor exam as listed above. Psychiatric: Judgment intact, Mood & affect appropriate for pt's clinical situation. Dermatologic: Wound left great toe Lymph : No Cervical, Axillary, or Inguinal lymphadenopathy.    CBC Lab Results  Component Value Date   WBC 4.3 08/22/2021   HGB  13.6 08/22/2021   HCT 41.4 08/22/2021   MCV 92.4 08/22/2021   PLT 135 (L) 08/22/2021    BMET    Component Value Date/Time   NA 139 08/22/2021 0351   NA 138 03/02/2014 1943   K 3.9 08/22/2021 0351   K 4.7 03/02/2014 1943   CL 103 08/22/2021 0351   CL 102 03/02/2014 1943   CO2 30 08/22/2021 0351   CO2 33 (H) 03/02/2014 1943   GLUCOSE 336 (H) 08/22/2021 0351   GLUCOSE 349 (H) 03/02/2014 1943   BUN 24 (H) 08/22/2021 0351   BUN 28 (H) 03/02/2014 1943   CREATININE 0.80 08/22/2021 0351   CREATININE 1.30 03/02/2014 1943   CALCIUM 8.6 (L) 08/22/2021 0351   CALCIUM 9.0 03/02/2014 1943   GFRNONAA >60 08/22/2021 0351   GFRNONAA >60 03/02/2014 1943   GFRNONAA >60 01/11/2012 1707   GFRAA >60 06/10/2015 1446   GFRAA >60 03/02/2014 1943   GFRAA >60 01/11/2012 1707   Estimated Creatinine Clearance: 99.8 mL/min (by C-G formula based on SCr of 0.8 mg/dL).  COAG Lab Results  Component Value Date   INR 1.0 09/27/2020   INR 1.0 01/11/2012    Radiology US ARTERIAL ABI (SCREENING LOWER EXTREMITY)  Result Date: 08/22/2021 CLINICAL DATA:  Diabetic foot ulcer EXAM: NONINVASIVE PHYSIOLOGIC VASCULAR STUDY OF BILATERAL LOWER EXTREMITIES TECHNIQUE: Evaluation of both lower extremities were performed at rest, including calculation of ankle-brachial indices with single level Doppler, pressure and  pulse volume recording. COMPARISON:  None Available. FINDINGS: Right ABI:  0.94 Left ABI:  0.76 Right Lower Extremity: Normal triphasic arterial waveforms at the ankle. Left Lower Extremity: Dampened biphasic/monophasic waveforms at the ankle. IMPRESSION: 1. Mildly abnormal right lower extremity ABI 0.94 with normal arterial waveforms at the ankle. 2. Moderately abnormal left lower extremity ABI 0.76 with dampened arterial waveforms at the ankle. Electronically Signed   By: Albin Felling M.D.   On: 08/22/2021 16:01   MR FOOT LEFT WO CONTRAST  Result Date: 08/21/2021 CLINICAL DATA:  Foot swelling, diabetic, osteomyelitis suspected, xray done EXAM: MRI OF THE LEFT FOOT WITHOUT CONTRAST TECHNIQUE: Multiplanar, multisequence MR imaging of the left forefoot was performed. No intravenous contrast was administered. COMPARISON:  Left foot MRI 09/27/2020. FINDINGS: Bones/Joint/Cartilage There is severe first metatarsophalangeal joint osteoarthritis, with mild subchondral marrow edema and hallux valgus deformity. There is progressive fragmentation of the medial great toe sesamoid with marrow edema and low T1 signal. Milder signal abnormality in the lateral sesamoid. Unchanged undersurface erosion at the base of the first metatarsal head and marginal marrow edema signal (series 8, image 23). Preserved T1 marrow signal in the great toe phalanges. 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 There is no other significant marrow signal alteration. Ligaments Intact Lisfranc ligament. Muscles and Tendons No acute tendon tear. There is diffuse intramuscular edema and atrophy in the foot as is commonly seen in diabetics. Soft tissues Mild soft tissue swelling of the foot and more focally along the great toe. There is susceptibility artifact along the medial soft tissues (series 5, images 23-24), without foreign body visualized radiographically. Focal callus formation along the plantar aspect of the  second metatarsal head. IMPRESSION: Progressive fragmentation of the medial great toe sesamoid with marrow signal abnormality, mild marrow edema of the lateral sesamoid, and adjacent marrow edema along the plantar surface of the first metatarsal head with unchanged undersurface erosion. 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 Medial forefoot susceptibility artifact and soft tissue swelling, suspicious for the presence of an ulcer and possible foreign body, though there is no radiopaque foreign body identified on same day radiograph. Correlate with exam. No evidence of soft tissue abscess. 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 Electronically Signed   By: Maurine Simmering M.D.   On: 08/21/2021 14:48   DG Foot 2 Views Left  Result Date: 08/21/2021 CLINICAL DATA:  Diabetic left great toe ulcer EXAM: LEFT FOOT - 2 VIEW COMPARISON:  09/27/2020 FINDINGS: Interval transmetatarsal resection of the fifth digit. Amputation of the second digit again seen. Advanced degenerative changes of the first metatarsal phalangeal joint again seen. Soft tissue swelling noted along the plantar aspect of the forefoot. A small erosion is suspected in the anterior aspect of the sesamoid underlying the region of soft tissue swelling. IMPRESSION: 1. Soft tissue swelling of the plantar forefoot with small erosion of the underlying sesamoid suspicious for osteomyelitis. 2. Postsurgical changes of second and fifth toe amputation are seen. Electronically Signed   By: Miachel Roux M.D.   On: 08/21/2021 09:21   DG ABD ACUTE 2+V W 1V CHEST  Result Date: 08/18/2021 CLINICAL DATA:  Vomiting and abdominal pain EXAM: DG ABDOMEN ACUTE WITH 1 VIEW CHEST COMPARISON:  03/12/2020 FINDINGS: There is no evidence  of dilated bowel loops or free intraperitoneal air. No radiopaque calculi or other significant radiographic abnormality is seen. Heart size and mediastinal contours are within normal limits. Both lungs are clear. IMPRESSION: Negative abdominal radiographs.  No acute cardiopulmonary disease. Electronically Signed   By: Donavan Foil M.D.   On: 08/18/2021 22:12      Assessment/Plan 1. PAD with ulceration  The patient's ABIs on the left lower extremity are 0.76 with monophasic waveforms.  Given the severity of the wound and his other associated risk factors, it would be in the patient's best interest to proceed with a lower extremity angiogram for revascularization purposes.  I discussed with the patient as well as the risk benefits and alternatives.  He is agreeable to proceed.  2. Diabetic Ketoacidosis  Maintaining tight glycemic control will be important.  Patient to have insulin pump set up.  This is a large contributing factor to worsening PAD.  3. Diabetic foot ulcer  Concern for possible osteomyelitis.  Patient is being followed by podiatry.  We will continue to defer to podiatry for wound care recommendations. Family Communication: Family at bedside  Thank you for allowing [place] to participate in the care of this patient.   Kris Hartmann, NP [facility] [phone] (Office Phone) [phone] (Office Fax)  08/23/2021 3:51 PM  Staff may message me via secure chat in [place]  but this may not receive immediate response,  please page for urgent matters!  Dictation software was used to generate the above note. Typos may occur and escape review, as with typed/written notes. Any error is purely unintentional.  Please contact me directly for clarity if needed.

## 2021-08-23 NOTE — Progress Notes (Addendum)
  Progress Note   Patient: Leonard Johnston WPY:[id number] DOB: 10-05-1965 DOA: 08/18/2021     4 DOS: the patient was seen and examined on 08/23/2021   Brief hospital course: 56 year old man with past medical history of type 1 diabetes mellitus, GERD, hyperlipidemia and anxiety.  The patient was initially admitted to the hospital on 08/18/2021 with diabetic ketoacidosis.  He was switched off the insulin drip but was having trouble eating for couple days.  He was noted to have a diabetic foot ulcer on 08/21/2021.  X-ray and MRI were obtained.  Podiatry does not think that this is an osteomyelitis but wanted to watch on antibiotics for couple days.  Assessment and Plan: * Diabetic foot ulcer ([place]) --On the inside of his first toe he does have a ulceration.  First toe slight erythema.   --X-ray shows some soft tissue swelling of the plantar forefoot with small erosion of the underlying sesamoid suspicious for osteomyelitis. --  MRI commented on possible osteomyelitis of the sesamoid bones and first phalanx.  -- Vancomycin and Unasyn started on 08/21/2021 and cont the same given MRSA pitive foot cx last fall per podiatry.  Podiatry does not think that this is osteomyelitis.   --ABI abnormal and s/o PAD--vascualr consult recommended. Pt is ex-smoker --dr Leonard Johnston informed   DKA (diabetic ketoacidosis) ([place]) --Since patient is eating better  will increase long-acting insulin to 25 units at night and continue short acting insulin plus sliding scale prior to meals. --  Hemoglobin A1c at 11.  Patient working on setting up insulin pump at home. --he follows with [facility] endocrinology  Nausea vomiting and diarrhea -- improved.  Patient is eating better.  Acute renal failure superimposed on stage 3a chronic kidney disease ([place]) Cr went from 1.96 down to 0.8. --stable  GERD (gastroesophageal reflux disease) --On protonix  PAD (peripheral artery disease) ([facility]) --Abnormal ABI -vascular consulted --h/o Smoking int he  past , HTN and Diabetes  Hypokalemia Replaced  Essential hypertension Continue lisinopril 10 mg daily        Subjective: no foot pain, eating well, sugars improving  Physical Exam: Vitals:   08/22/21 1145 08/22/21 2016 08/23/21 0536 08/23/21 0755  BP: 128/78 117/64 102/64 136/83  Pulse: 91 87 85 82  Resp: '16 17 17 16  '$ Temp: 98.4 F (36.9 C) 98.4 F (36.9 C) 99.2 F (37.3 C) 98.3 F (36.8 C)  TempSrc: Oral     SpO2: 100% 100% 94% 98%  Weight:      Height:       Cardiovascular:     Rate and Rhythm: Normal rate and regular rhythm.     Heart sounds: Normal heart sounds, S1 normal and S2 normal.  Pulmonary:     Breath sounds: No decreased breath sounds, wheezing, rhonchi or rales.  Abdominal:     Palpations: Abdomen is soft.   Skin:    General: Skin is warm.     Comments: Left foot wrapped today.  Neurological:     Mental Status: He is alert and oriented to person, place, and time.   Family Communication: family at bedside  Disposition:home after vascular evaluation 1-2 days Status is: Inpatient Remains inpatient appropriate because: vascular consulted  Planned Discharge Destination: Home    Time spent: 30 minutes  Author: Fritzi Mandes, MD 08/23/2021 8:04 AM  For on call review [email or web address].

## 2021-08-23 NOTE — Assessment & Plan Note (Addendum)
--  Abnormal ABI -vascular consulted.  --Dr [name] --s/p Left LE angiogram  Percutaneous transluminal angioplasty of left tibioperoneal trunk and proximal posterior tibial artery with 3 mm diameter by 15 cm length angioplasty balloon. Percutaneous transluminal angioplasty of the left SFA with 6 mm diameter by 15 cm length Lutonix drug-coated angioplasty balloon.Stent placement to the left SFA for residual stenosis after angioplasty with 7 mm diameter by 8 cm length life stent --h/o Smoking int he past , HTN and Diabetes --asa +plavix per dr dew

## 2021-08-23 NOTE — Consult Note (Signed)
Leonard Johnston  MRN : [id number]  Leonard Johnston is a 56 y.o. (Jul 31, 1965) male who presents with chief complaint of  Chief Complaint  Patient presents with   Hyperglycemia  .   Consulting Physician: Fritzi Mandes, MD  Reason for consult: Diabetic foot wound History of Present Illness: Leonard Johnston is a 56 year old male who presented to [facility] on 08/18/2021 with diabetic ketoacidosis.  He has a history of type 1 diabetes mellitus, hyperlipidemia, GERD and PAD.  It was noted that on 08/21/2021 he had a diabetic foot ulcer to the left great toe.  The wound is probable to the bone.  Currently podiatry does not feel like there is osteomyelitis present but it was noted that there was little blood during the bedside debridement.  He underwent noninvasive studies which showed decreased ABIs on the left side of 0.76.  Current Facility-Administered Medications  Medication Dose Route Frequency Provider Last Rate Last Admin   acetaminophen (TYLENOL) tablet 650 mg  650 mg Oral Q6H PRN Loletha Grayer, MD   650 mg at 08/20/21 1652   Ampicillin-Sulbactam (UNASYN) 3 g in sodium chloride 0.9 % 100 mL IVPB  3 g Intravenous Q6H Oswald Hillock, RPH 200 mL/hr at 08/23/21 1432 3 g at 08/23/21 1432   atorvastatin (LIPITOR) tablet 40 mg  40 mg Oral Daily Oswald Hillock, RPH   40 mg at 08/22/21 2111   dextrose 50 % solution 0-50 mL  0-50 mL Intravenous PRN [name], MD       enoxaparin (LOVENOX) injection 40 mg  40 mg Subcutaneous Q24H [name], MD       insulin aspart (novoLOG) injection 0-15 Units  0-15 Units Subcutaneous TID WC Loletha Grayer, MD   2 Units at 08/23/21 0859   insulin aspart (novoLOG) injection 0-5 Units  0-5 Units Subcutaneous QHS [name], [name], MD       insulin aspart (novoLOG) injection 5 Units  5 Units Subcutaneous TID WC Fritzi Mandes, MD   5 Units at 08/23/21 1256   insulin glargine-yfgn (SEMGLEE)  injection 25 Units  25 Units Subcutaneous QHS Fritzi Mandes, MD       lisinopril (ZESTRIL) tablet 10 mg  10 mg Oral Daily Loletha Grayer, MD   10 mg at 08/23/21 0935   ondansetron (ZOFRAN) injection 4 mg  4 mg Intravenous Q6H PRN Loletha Grayer, MD   4 mg at 08/19/21 1258   pantoprazole (PROTONIX) EC tablet 40 mg  40 mg Oral QHS Benita Gutter, RPH   40 mg at 08/22/21 2112   vancomycin (VANCOCIN) IVPB 1000 mg/200 mL premix  1,000 mg Intravenous Q12H Oswald Hillock, RPH 200 mL/hr at 08/23/21 1256 1,000 mg at 08/23/21 1256    Past Medical History:  Diagnosis Date   Anxiety    Diabetes mellitus type I ([place])    Diabetes mellitus without complication ([place])    type 1   GERD (gastroesophageal reflux disease)    Hypercholesteremia     Past Surgical History:  Procedure Laterality Date   amputaion of toe     COLONOSCOPY WITH PROPOFOL N/A 06/24/2017   Procedure: COLONOSCOPY WITH PROPOFOL;  Surgeon: Manya Silvas, MD;  Location: [facility] ENDOSCOPY;  Service: Endoscopy;  Laterality: N/A;   ESOPHAGOGASTRODUODENOSCOPY (EGD) WITH PROPOFOL N/A 06/24/2017   Procedure: ESOPHAGOGASTRODUODENOSCOPY (EGD) WITH PROPOFOL;  Surgeon: Manya Silvas, MD;  Location: [facility] ENDOSCOPY;  Service: Endoscopy;  Laterality: N/A;   EYE SURGERY  TOE SURGERY      Social History Social History   Tobacco Use   Smoking status: Former    Types: Cigarettes    Quit date: 06/24/2009    Years since quitting: 12.1   Smokeless tobacco: Never  Vaping Use   Vaping Use: Never used  Substance Use Topics   Alcohol use: No   Drug use: No    Family History Family History  Problem Relation Age of Onset   Alcohol abuse Sister    Drug abuse Sister    Alcohol abuse Sister    Drug abuse Sister     No Known Allergies   REVIEW OF SYSTEMS (Negative unless checked)  Constitutional: '[]'$ Weight loss  '[]'$ Fever  '[]'$ Chills Cardiac: '[]'$ Chest pain   '[]'$ Chest pressure   '[]'$ Palpitations   '[]'$ Shortness of breath when laying flat    '[]'$ Shortness of breath at rest   '[]'$ Shortness of breath with exertion. Vascular:  '[]'$ Pain in legs with walking   '[]'$ Pain in legs at rest   '[]'$ Pain in legs when laying flat   '[]'$ Claudication   '[]'$ Pain in feet when walking  '[]'$ Pain in feet at rest  '[]'$ Pain in feet when laying flat   '[]'$ History of DVT   '[]'$ Phlebitis   '[]'$ Swelling in legs   '[]'$ Varicose veins   '[]'$ Non-healing ulcers Pulmonary:   '[]'$ Uses home oxygen   '[]'$ Productive cough   '[]'$ Hemoptysis   '[]'$ Wheeze  '[]'$ COPD   '[]'$ Asthma Neurologic:  '[]'$ Dizziness  '[]'$ Blackouts   '[]'$ Seizures   '[]'$ History of stroke   '[]'$ History of TIA  '[]'$ Aphasia   '[]'$ Temporary blindness   '[]'$ Dysphagia   '[]'$ Weakness or numbness in arms   '[]'$ Weakness or numbness in legs Musculoskeletal:  '[]'$ Arthritis   '[]'$ Joint swelling   '[]'$ Joint pain   '[]'$ Low back pain Hematologic:  '[]'$ Easy bruising  '[]'$ Easy bleeding   '[]'$ Hypercoagulable state   '[]'$ Anemic  '[]'$ Hepatitis Gastrointestinal:  '[]'$ Blood in stool   '[]'$ Vomiting blood  '[]'$ Gastroesophageal reflux/heartburn   '[]'$ Difficulty swallowing. Genitourinary:  '[]'$ Chronic kidney disease   '[]'$ Difficult urination  '[]'$ Frequent urination  '[]'$ Burning with urination   '[]'$ Blood in urine Skin:  '[]'$ Rashes   '[]'$ Ulcers   '[x]'$ Wounds Psychological:  '[]'$ History of anxiety   '[]'$  History of major depression.  Physical Examination  Vitals:   08/22/21 1145 08/22/21 2016 08/23/21 0536 08/23/21 0755  BP: 128/78 117/64 102/64 136/83  Pulse: 91 87 85 82  Resp: '16 17 17 16  '$ Temp: 98.4 F (36.9 C) 98.4 F (36.9 C) 99.2 F (37.3 C) 98.3 F (36.8 C)  TempSrc: Oral     SpO2: 100% 100% 94% 98%  Weight:      Height:       Body mass index is 16.37 kg/m. Gen:  WD/WN, NAD Head: [place]/AT, No temporalis wasting. Prominent temp pulse not noted. Ear/Nose/Throat: Hearing grossly intact, nares w/o erythema or drainage, oropharynx w/o Erythema/Exudate Eyes: Sclera non-icteric, conjunctiva clear Neck: Trachea midline.  No JVD.  Pulmonary:  Good air movement, respirations not labored, equal bilaterally.  Cardiac: RRR,  normal S1, S2. Vascular: Nonpalpable pulses bilaterally  Gastrointestinal: soft, non-tender/non-distended. No guarding/reflex.  Musculoskeletal: M/S 5/5 throughout.  Extremities without ischemic changes.  No deformity or atrophy. No edema. Neurologic: Sensation grossly intact in extremities.  Symmetrical.  Speech is fluent. Motor exam as listed above. Psychiatric: Judgment intact, Mood & affect appropriate for pt's clinical situation. Dermatologic: Wound left great toe Lymph : No Cervical, Axillary, or Inguinal lymphadenopathy.    CBC Lab Results  Component Value Date   WBC 4.3 08/22/2021   HGB  13.6 08/22/2021   HCT 41.4 08/22/2021   MCV 92.4 08/22/2021   PLT 135 (L) 08/22/2021    BMET    Component Value Date/Time   NA 139 08/22/2021 0351   NA 138 03/02/2014 1943   K 3.9 08/22/2021 0351   K 4.7 03/02/2014 1943   CL 103 08/22/2021 0351   CL 102 03/02/2014 1943   CO2 30 08/22/2021 0351   CO2 33 (H) 03/02/2014 1943   GLUCOSE 336 (H) 08/22/2021 0351   GLUCOSE 349 (H) 03/02/2014 1943   BUN 24 (H) 08/22/2021 0351   BUN 28 (H) 03/02/2014 1943   CREATININE 0.80 08/22/2021 0351   CREATININE 1.30 03/02/2014 1943   CALCIUM 8.6 (L) 08/22/2021 0351   CALCIUM 9.0 03/02/2014 1943   GFRNONAA >60 08/22/2021 0351   GFRNONAA >60 03/02/2014 1943   GFRNONAA >60 01/11/2012 1707   GFRAA >60 06/10/2015 1446   GFRAA >60 03/02/2014 1943   GFRAA >60 01/11/2012 1707   Estimated Creatinine Clearance: 99.8 mL/min (by C-G formula based on SCr of 0.8 mg/dL).  COAG Lab Results  Component Value Date   INR 1.0 09/27/2020   INR 1.0 01/11/2012    Radiology US ARTERIAL ABI (SCREENING LOWER EXTREMITY)  Result Date: 08/22/2021 CLINICAL DATA:  Diabetic foot ulcer EXAM: NONINVASIVE PHYSIOLOGIC VASCULAR STUDY OF BILATERAL LOWER EXTREMITIES TECHNIQUE: Evaluation of both lower extremities were performed at rest, including calculation of ankle-brachial indices with single level Doppler, pressure and  pulse volume recording. COMPARISON:  None Available. FINDINGS: Right ABI:  0.94 Left ABI:  0.76 Right Lower Extremity: Normal triphasic arterial waveforms at the ankle. Left Lower Extremity: Dampened biphasic/monophasic waveforms at the ankle. IMPRESSION: 1. Mildly abnormal right lower extremity ABI 0.94 with normal arterial waveforms at the ankle. 2. Moderately abnormal left lower extremity ABI 0.76 with dampened arterial waveforms at the ankle. Electronically Signed   By: Albin Felling M.D.   On: 08/22/2021 16:01   MR FOOT LEFT WO CONTRAST  Result Date: 08/21/2021 CLINICAL DATA:  Foot swelling, diabetic, osteomyelitis suspected, xray done EXAM: MRI OF THE LEFT FOOT WITHOUT CONTRAST TECHNIQUE: Multiplanar, multisequence MR imaging of the left forefoot was performed. No intravenous contrast was administered. COMPARISON:  Left foot MRI 09/27/2020. FINDINGS: Bones/Joint/Cartilage There is severe first metatarsophalangeal joint osteoarthritis, with mild subchondral marrow edema and hallux valgus deformity. There is progressive fragmentation of the medial great toe sesamoid with marrow edema and low T1 signal. Milder signal abnormality in the lateral sesamoid. Unchanged undersurface erosion at the base of the first metatarsal head and marginal marrow edema signal (series 8, image 23). Preserved T1 marrow signal in the great toe phalanges. 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 There is no other significant marrow signal alteration. Ligaments Intact Lisfranc ligament. Muscles and Tendons No acute tendon tear. There is diffuse intramuscular edema and atrophy in the foot as is commonly seen in diabetics. Soft tissues Mild soft tissue swelling of the foot and more focally along the great toe. There is susceptibility artifact along the medial soft tissues (series 5, images 23-24), without foreign body visualized radiographically. Focal callus formation along the plantar aspect of the  second metatarsal head. IMPRESSION: Progressive fragmentation of the medial great toe sesamoid with marrow signal abnormality, mild marrow edema of the lateral sesamoid, and adjacent marrow edema along the plantar surface of the first metatarsal head with unchanged undersurface erosion. 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 Medial forefoot susceptibility artifact and soft tissue swelling, suspicious for the presence of an ulcer and possible foreign body, though there is no radiopaque foreign body identified on same day radiograph. Correlate with exam. No evidence of soft tissue abscess. 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 Electronically Signed   By: Maurine Simmering M.D.   On: 08/21/2021 14:48   DG Foot 2 Views Left  Result Date: 08/21/2021 CLINICAL DATA:  Diabetic left great toe ulcer EXAM: LEFT FOOT - 2 VIEW COMPARISON:  09/27/2020 FINDINGS: Interval transmetatarsal resection of the fifth digit. Amputation of the second digit again seen. Advanced degenerative changes of the first metatarsal phalangeal joint again seen. Soft tissue swelling noted along the plantar aspect of the forefoot. A small erosion is suspected in the anterior aspect of the sesamoid underlying the region of soft tissue swelling. IMPRESSION: 1. Soft tissue swelling of the plantar forefoot with small erosion of the underlying sesamoid suspicious for osteomyelitis. 2. Postsurgical changes of second and fifth toe amputation are seen. Electronically Signed   By: Miachel Roux M.D.   On: 08/21/2021 09:21   DG ABD ACUTE 2+V W 1V CHEST  Result Date: 08/18/2021 CLINICAL DATA:  Vomiting and abdominal pain EXAM: DG ABDOMEN ACUTE WITH 1 VIEW CHEST COMPARISON:  03/12/2020 FINDINGS: There is no evidence  of dilated bowel loops or free intraperitoneal air. No radiopaque calculi or other significant radiographic abnormality is seen. Heart size and mediastinal contours are within normal limits. Both lungs are clear. IMPRESSION: Negative abdominal radiographs.  No acute cardiopulmonary disease. Electronically Signed   By: Donavan Foil M.D.   On: 08/18/2021 22:12      Assessment/Plan 1. PAD with ulceration  The patient's ABIs on the left lower extremity are 0.76 with monophasic waveforms.  Given the severity of the wound and his other associated risk factors, it would be in the patient's best interest to proceed with a lower extremity angiogram for revascularization purposes.  I discussed with the patient as well as the risk benefits and alternatives.  He is agreeable to proceed.  2. Diabetic Ketoacidosis  Maintaining tight glycemic control will be important.  Patient to have insulin pump set up.  This is a large contributing factor to worsening PAD.  3. Diabetic foot ulcer  Concern for possible osteomyelitis.  Patient is being followed by podiatry.  We will continue to defer to podiatry for wound care recommendations. Family Communication: Family at bedside  Thank you for allowing [place] to participate in the care of this patient.   Kris Hartmann, NP [facility] [phone] (Office Phone) [phone] (Office Fax)  08/23/2021 3:51 PM  Staff may message me via secure chat in [place]  but this may not receive immediate response,  please page for urgent matters!  Dictation software was used to generate the above Johnston. Typos may occur and escape review, as with typed/written notes. Any error is purely unintentional.  Please contact me directly for clarity if needed.

## 2021-08-24 DIAGNOSIS — I10 Essential (primary) hypertension: Secondary | ICD-10-CM | POA: Diagnosis not present

## 2021-08-24 DIAGNOSIS — L97509 Non-pressure chronic ulcer of other part of unspecified foot with unspecified severity: Secondary | ICD-10-CM | POA: Diagnosis not present

## 2021-08-24 DIAGNOSIS — E1010 Type 1 diabetes mellitus with ketoacidosis without coma: Secondary | ICD-10-CM | POA: Diagnosis not present

## 2021-08-24 DIAGNOSIS — E10621 Type 1 diabetes mellitus with foot ulcer: Secondary | ICD-10-CM | POA: Diagnosis not present

## 2021-08-24 DIAGNOSIS — N179 Acute kidney failure, unspecified: Secondary | ICD-10-CM | POA: Diagnosis not present

## 2021-08-24 LAB — CREATININE, SERUM
Creatinine, Ser: 1.21 mg/dL (ref 0.61–1.24)
GFR, Estimated: >60 mL/min (ref >60)

## 2021-08-24 LAB — GLUCOSE, CAPILLARY
Glucose-Capillary: 323 mg/dL — ABNORMAL HIGH (ref 70–99)
Glucose-Capillary: 59 mg/dL — ABNORMAL LOW (ref 70–99)
Glucose-Capillary: 70 mg/dL (ref 70–99)
Glucose-Capillary: 299 mg/dL — ABNORMAL HIGH (ref 70–99)
Glucose-Capillary: 118 mg/dL — ABNORMAL HIGH (ref 70–99)

## 2021-08-24 MED ORDER — INSULIN GLARGINE-YFGN 100 UNIT/ML ~~LOC~~ SOLN
20.0000 [IU] | Freq: Every day | SUBCUTANEOUS | Status: DC
  Filled 2021-08-24 (×2): qty 0.2

## 2021-08-24 MED ORDER — VANCOMYCIN HCL 750 MG/150ML IV SOLN
750.0000 mg | Freq: Two times a day (BID) | INTRAVENOUS | Status: DC
  Administered 2021-08-24 – 2021-08-25 (×2): 750 mg via INTRAVENOUS
  Filled 2021-08-24 (×3): qty 150

## 2021-08-24 NOTE — TOC Progression Note (Signed)
Transition of Care [facility]) - Progression Note    Patient Details  Name: Leonard Johnston MRN: [id number] Date of Birth: 01-19-1966  [facility] of [facility]) CM/SW Contact  Donnelly Angelica, LCSW Phone Number: 08/24/2021, 1:55 PM  Clinical Narrative:    CSW reviewed pt's chart, there are no TOC needs at this time.        Expected Discharge Plan and Services                                                 Social Determinants of Health (SDOH) Interventions    Readmission Risk Interventions     No data to display

## 2021-08-24 NOTE — Progress Notes (Addendum)
  Subjective:  Patient ID: Leonard Johnston, male    DOB: 1965-08-02,  MRN: [id number]  A 56 y.o. male was seen at bedside today.  He states is doing okay.  No pain no nausea fever chills vomiting.  He has been keeping the foot wrapped with Betadine dressing.  He is diabetic denies any other acute complaints  Objective:   Vitals:   08/24/21 0503 08/24/21 0716  BP: 128/66 130/83  Pulse: 86 81  Resp: 17 16  Temp: 98.5 F (36.9 C) 97.7 F (36.5 C)  SpO2: 95% 98%   General AA&O x3. Normal mood and affect.  Vascular Dorsalis pedis and posterior tibial pulses n faintly palpable eft side Brisk capillary refill to all digits. Pedal hair present.  Neurologic Epicritic sensation grossly intact.  Dermatologic Left great toe ulceration which is improving.  Probes down to bone but no purulent drainage noted redness is resolving.  No malodor present.  Orthopedic: MMT 5/5 in dorsiflexion, plantarflexion, inversion, and eversion. Normal joint ROM without pain or crepitus.  Previous amputation sites noted    Assessment & Plan:  Patient was evaluated and treated and all questions answered.  Left great toe infection with cellulitis resolving -Imaging: Imaging was reviewed.  The ulceration does not correlate with the findings of osteomyelitis.  On MRI the MRI of the proximal and distal phalanx are within normal limits without any signs of osteomyelitis -Antibiotics: Continue broad-spectrum -WB Status: WBAT -Surgical Plan: No surgical plan from our standpoint.  Patient will need antibiotics for 14 days doxycycline and can be discharged from our standpoint.  Patient will need Betadine wet-to-dry dressing changes once a day.  He will follow-up with [place] in clinic 1 week from discharge -ABIs.  Reviewed which shows patient may have vascular compromise as well. -Patient scheduled to undergo angiogram tomorrow -   Felipa Furnace, DPM  Accessible via secure chat for questions or concerns.

## 2021-08-24 NOTE — Consult Note (Signed)
Pharmacy Antibiotic Note  Leonard Johnston is a 56 y.o. male admitted on 08/18/2021 with DKA and developed a wound on his foot. Pharmacy has been consulted for Unasyn and Vancomycin dosing. Pt has a history of MRSA infection. Positive abscess culture (09/2020), for streptococcus agalactiae and MRSA.   Plan: Continue Unasyn 3g IV every 6 hours  Decrease vancomycin from '1000mg'$  IV q12h to '750mg'$  IV q12h Goal AUC 400-550  Est AUC: 521.1 Est Cmax: 30.3 Est Cmin: 15.8 Calculated with SCr 1.21 mg/dL   Height: '6\' 8"'$  (203.2 cm) Weight: 67.6 kg (149 lb 0.5 oz) IBW/kg (Calculated) : 96  Temp (24hrs), Avg:98.3 F (36.8 C), Min:97.7 F (36.5 C), Max:98.5 F (36.9 C)  Recent Labs  Lab 08/18/21 1920 08/19/21 0156 08/19/21 0808 08/20/21 0420 08/21/21 0427 08/22/21 0351 08/24/21 0328  WBC 9.4  --   --   --   --  4.3  --   CREATININE 1.96*   < > 1.30* 1.06 0.97 0.80 1.21   < > = values in this interval not displayed.     Estimated Creatinine Clearance: 66 mL/min (by C-G formula based on SCr of 1.21 mg/dL).    No Known Allergies  Antimicrobials this admission: Vancomycin 6/26 >> Unasyn 6/26 >>   Dose adjustments this admission: N/A  Microbiology results:  None   Thank you for allowing pharmacy to be a part of this patient's care.  [place] Pharmacist 08/24/2021 9:33 AM

## 2021-08-24 NOTE — Progress Notes (Signed)
  Progress Note   Patient: Leonard Johnston QQI:[id number] DOB: Feb 28, 1965 DOA: 08/18/2021     5 DOS: the patient was seen and examined on 08/24/2021   Brief hospital course: 56 year old man with past medical history of type 1 diabetes mellitus, GERD, hyperlipidemia and anxiety.  The patient was initially admitted to the hospital on 08/18/2021 with diabetic ketoacidosis.  He was switched off the insulin drip but was having trouble eating for couple days.  He was noted to have a diabetic foot ulcer on 08/21/2021.  X-ray and MRI were obtained.  Podiatry does not think that this is an osteomyelitis but wanted to watch on antibiotics for couple days.  Assessment and Plan: * Diabetic foot ulcer ([place]) --On the inside of his first toe he does have a ulceration.  First toe slight erythema.   --X-ray shows some soft tissue swelling of the plantar forefoot with small erosion of the underlying sesamoid suspicious for osteomyelitis. --  MRI commented on possible osteomyelitis of the sesamoid bones and first phalanx.  -- Vancomycin and Unasyn started on 08/21/2021 and cont the same given MRSA pitive foot cx last fall per podiatry.  Podiatry does not think that this is osteomyelitis.   --ABI abnormal and s/o PAD--vascualr consult appreicated recommended. Pt is ex-smoker   DKA (diabetic ketoacidosis) ([place]) --Since patient is eating better  will increase long-acting insulin to 25 units at night and continue short acting insulin plus sliding scale prior to meals. --  Hemoglobin A1c at 11.  Patient working on setting up insulin pump at home. --he follows with [facility] endocrinology  Nausea vomiting and diarrhea -- improved.  Patient is eating better.  Acute renal failure superimposed on stage 3a chronic kidney disease ([place]) Cr went from 1.96 down to 0.8. --stable  GERD (gastroesophageal reflux disease) --On protonix  PAD (peripheral artery disease) ([facility]) --Abnormal ABI -vascular consulted. Dr [name] plans Left LE  angiogram tomorrow --h/o Smoking int he past , HTN and Diabetes  Hypokalemia Replaced  Essential hypertension Continue lisinopril 10 mg daily        Subjective: no foot pain, eating well, sugars much better Wife at bedside  Physical Exam: Vitals:   08/23/21 1712 08/23/21 2011 08/24/21 0503 08/24/21 0716  BP: (!) 145/87 (!) 174/93 128/66 130/83  Pulse: 77 84 86 81  Resp: '16 17 17 16  '$ Temp: 98.5 F (36.9 C) 98.3 F (36.8 C) 98.5 F (36.9 C) 97.7 F (36.5 C)  TempSrc:      SpO2: 98% 100% 95% 98%  Weight:      Height:       Cardiovascular:     Rate and Rhythm: Normal rate and regular rhythm.     Heart sounds: Normal heart sounds, S1 normal and S2 normal.  Pulmonary:     Breath sounds: No decreased breath sounds, wheezing, rhonchi or rales.  Abdominal:     Palpations: Abdomen is soft.   Skin:    General: Skin is warm.     Comments: Left foot wrapped   Neurological:     Mental Status: He is alert and oriented to person, place, and time.   Family Communication: family at bedside   Status is: Inpatient Remains inpatient appropriate because: vascular consulted  Planned Discharge Destination: Home    Discharge pending LE angiogram tomorrow(Friday)  Time spent: 30 minutes  Author: Fritzi Mandes, MD 08/24/2021 7:53 AM  For on call review [email or web address].

## 2021-08-25 ENCOUNTER — Encounter: Payer: Self-pay | Admitting: Vascular Surgery

## 2021-08-25 ENCOUNTER — Encounter
Admission: EM | Disposition: A | Discharge status: Home / Self Care | Payer: Self-pay | Source: Home / Self Care | Attending: Internal Medicine

## 2021-08-25 DIAGNOSIS — I7092 Chronic total occlusion of artery of the extremities: Secondary | ICD-10-CM

## 2021-08-25 DIAGNOSIS — I70245 Atherosclerosis of native arteries of left leg with ulceration of other part of foot: Secondary | ICD-10-CM

## 2021-08-25 HISTORY — PX: LOWER EXTREMITY ANGIOGRAPHY: CATH118251

## 2021-08-25 LAB — CREATININE, SERUM
Creatinine, Ser: 1 mg/dL (ref 0.61–1.24)
GFR, Estimated: >60 mL/min (ref >60)

## 2021-08-25 LAB — GLUCOSE, CAPILLARY
Glucose-Capillary: 364 mg/dL — ABNORMAL HIGH (ref 70–99)
Glucose-Capillary: 306 mg/dL — ABNORMAL HIGH (ref 70–99)
Glucose-Capillary: 150 mg/dL — ABNORMAL HIGH (ref 70–99)
Glucose-Capillary: 64 mg/dL — ABNORMAL LOW (ref 70–99)
Glucose-Capillary: 69 mg/dL — ABNORMAL LOW (ref 70–99)
Glucose-Capillary: 112 mg/dL — ABNORMAL HIGH (ref 70–99)
Glucose-Capillary: 82 mg/dL (ref 70–99)

## 2021-08-25 SURGERY — LOWER EXTREMITY ANGIOGRAPHY
Anesthesia: Moderate Sedation | Laterality: Left

## 2021-08-25 MED ORDER — CEFAZOLIN SODIUM-DEXTROSE 2-4 GM/100ML-% IV SOLN: 2.0000 g | INTRAVENOUS | Status: AC

## 2021-08-25 MED ORDER — FENTANYL CITRATE (PF) 100 MCG/2ML IJ SOLN
INTRAMUSCULAR | Status: DC | PRN
  Administered 2021-08-25: 25 ug via INTRAVENOUS
  Administered 2021-08-25: 50 ug via INTRAVENOUS

## 2021-08-25 MED ORDER — DIPHENHYDRAMINE HCL 50 MG/ML IJ SOLN: 50.0000 mg | Freq: Once | INTRAMUSCULAR | Status: DC | PRN

## 2021-08-25 MED ORDER — CLOPIDOGREL BISULFATE 75 MG PO TABS: 75.0000 mg | ORAL_TABLET | Freq: Every day | ORAL | 3 refills | Status: DC

## 2021-08-25 MED ORDER — METHYLPREDNISOLONE SODIUM SUCC 125 MG IJ SOLR: 125.0000 mg | Freq: Once | INTRAMUSCULAR | Status: DC | PRN

## 2021-08-25 MED ORDER — CLOPIDOGREL BISULFATE 75 MG PO TABS
75.0000 mg | ORAL_TABLET | Freq: Every day | ORAL | Status: DC
  Administered 2021-08-25: 75 mg via ORAL
  Filled 2021-08-25: qty 1

## 2021-08-25 MED ORDER — DIPHENHYDRAMINE HCL 50 MG/ML IJ SOLN
INTRAMUSCULAR | Status: AC
  Filled 2021-08-25: qty 1

## 2021-08-25 MED ORDER — FENTANYL CITRATE PF 50 MCG/ML IJ SOSY
PREFILLED_SYRINGE | INTRAMUSCULAR | Status: AC
  Filled 2021-08-25: qty 2

## 2021-08-25 MED ORDER — HEPARIN SODIUM (PORCINE) 1000 UNIT/ML IJ SOLN
INTRAMUSCULAR | Status: DC | PRN
  Administered 2021-08-25: 5000 [IU] via INTRAVENOUS

## 2021-08-25 MED ORDER — DOXYCYCLINE HYCLATE 100 MG PO TABS: 100.0000 mg | ORAL_TABLET | Freq: Two times a day (BID) | ORAL | 0 refills | Status: AC

## 2021-08-25 MED ORDER — ASPIRIN 81 MG PO TBEC: 81.0000 mg | DELAYED_RELEASE_TABLET | Freq: Every day | ORAL | 12 refills | Status: AC

## 2021-08-25 MED ORDER — MIDAZOLAM HCL 2 MG/2ML IJ SOLN
INTRAMUSCULAR | Status: AC
  Filled 2021-08-25: qty 4

## 2021-08-25 MED ORDER — ASPIRIN 81 MG PO TBEC
81.0000 mg | DELAYED_RELEASE_TABLET | Freq: Every day | ORAL | Status: DC
  Administered 2021-08-25: 81 mg via ORAL
  Filled 2021-08-25: qty 1

## 2021-08-25 MED ORDER — HEPARIN SODIUM (PORCINE) 1000 UNIT/ML IJ SOLN
INTRAMUSCULAR | Status: AC
  Filled 2021-08-25: qty 10

## 2021-08-25 MED ORDER — CEFAZOLIN SODIUM-DEXTROSE 2-4 GM/100ML-% IV SOLN
INTRAVENOUS | Status: AC
  Administered 2021-08-25: 2 g via INTRAVENOUS
  Filled 2021-08-25: qty 100

## 2021-08-25 MED ORDER — DIPHENHYDRAMINE HCL 50 MG/ML IJ SOLN
INTRAMUSCULAR | Status: DC | PRN
  Administered 2021-08-25: 50 mg via INTRAVENOUS

## 2021-08-25 MED ORDER — HYDROMORPHONE HCL 1 MG/ML IJ SOLN: 1.0000 mg | Freq: Once | INTRAMUSCULAR | Status: DC | PRN

## 2021-08-25 MED ORDER — ONDANSETRON HCL 4 MG/2ML IJ SOLN
4.0000 mg | Freq: Four times a day (QID) | INTRAMUSCULAR | Status: DC | PRN
Start: 2021-08-25 — End: 2021-08-25

## 2021-08-25 MED ORDER — MIDAZOLAM HCL 2 MG/ML PO SYRP: 8.0000 mg | ORAL_SOLUTION | Freq: Once | ORAL | Status: DC | PRN

## 2021-08-25 MED ORDER — SODIUM CHLORIDE 0.9 % IV SOLN
INTRAVENOUS | Status: DC
Start: 2021-08-25 — End: 2021-08-25

## 2021-08-25 MED ORDER — FAMOTIDINE 20 MG PO TABS: 40.0000 mg | ORAL_TABLET | Freq: Once | ORAL | Status: DC | PRN

## 2021-08-25 MED ORDER — DOXYCYCLINE HYCLATE 100 MG PO TABS
100.0000 mg | ORAL_TABLET | Freq: Two times a day (BID) | ORAL | Status: DC
  Administered 2021-08-25: 100 mg via ORAL
  Filled 2021-08-25: qty 1

## 2021-08-25 MED ORDER — MIDAZOLAM HCL 2 MG/2ML IJ SOLN
INTRAMUSCULAR | Status: DC | PRN
  Administered 2021-08-25: 2 mg via INTRAVENOUS
  Administered 2021-08-25 (×2): 1 mg via INTRAVENOUS

## 2021-08-25 MED ORDER — LISINOPRIL 10 MG PO TABS: 10.0000 mg | ORAL_TABLET | Freq: Every day | ORAL | 0 refills | Status: DC

## 2021-08-25 MED ORDER — DOXYCYCLINE HYCLATE 100 MG PO TABS: 100.0000 mg | ORAL_TABLET | Freq: Two times a day (BID) | ORAL | Status: DC

## 2021-08-25 SURGICAL SUPPLY — 21 items
BALLN LUTONIX 6X150X130 (BALLOONS) ×2
BALLN LUTONIX DCB 6X100X130 (BALLOONS) ×2
BALLN ULTRVRSE 3X100X150 (BALLOONS)
BALLN ULTRVRSE 3X150X130 (BALLOONS) ×2
BALLOON LUTONIX 6X150X130 (BALLOONS) IMPLANT
BALLOON LUTONIX DCB 6X100X130 (BALLOONS) IMPLANT
BALLOON ULTRVRSE 3X100X150 (BALLOONS) IMPLANT
BALLOON ULTRVRSE 3X150X130 (BALLOONS) IMPLANT
CATH ANGIO 5F PIGTAIL 65CM (CATHETERS) ×1 IMPLANT
CATH BEACON 5 .038 100 VERT TP (CATHETERS) ×1 IMPLANT
DEVICE STARCLOSE SE CLOSURE (Vascular Products) ×1 IMPLANT
GLIDEWIRE ADV .035X260CM (WIRE) ×1 IMPLANT
KIT ENCORE 26 ADVANTAGE (KITS) ×1 IMPLANT
PACK ANGIOGRAPHY (CUSTOM PROCEDURE TRAY) ×2 IMPLANT
SHEATH ANL2 6FRX45 HC (SHEATH) ×1 IMPLANT
SHEATH BRITE TIP 5FRX11 (SHEATH) ×1 IMPLANT
STENT LIFESTENT 5F 7X80X135 (Permanent Stent) ×1 IMPLANT
SYR MEDRAD MARK 7 150ML (SYRINGE) ×1 IMPLANT
TUBING CONTRAST HIGH PRESS 72 (TUBING) ×1 IMPLANT
WIRE G V18X300CM (WIRE) ×1 IMPLANT
WIRE GUIDERIGHT .035X150 (WIRE) ×1 IMPLANT

## 2021-08-25 NOTE — Discharge Summary (Signed)
Physician Discharge Summary   Patient: Leonard Johnston MRN: [id number] DOB: 1965-03-29  Admit date:     08/18/2021  Discharge date: 08/25/21  Discharge Physician: Fritzi Mandes   PCP: Derinda Late, MD   Recommendations at discharge:   keep log of his sugars and continue your present insulin regimen at home for now till you are started on insulin pump. Follow recommendations with your endocrinologist.  Discharge Diagnoses: left diabetic foot ulcer PAD status post angiogram with stent placement   Hospital Course: 56 year old man with past medical history of type 1 diabetes mellitus, GERD, hyperlipidemia and anxiety.  The patient was initially admitted to the hospital on 08/18/2021 with diabetic ketoacidosis.  He was switched off the insulin drip but was having trouble eating for couple days.  He was noted to have a diabetic foot ulcer on 08/21/2021.  X-ray and MRI were obtained.  Podiatry does not think that this is an osteomyelitis but wanted to watch on antibiotics for couple days.  Assessment and Plan: * Diabetic foot ulcer ([place]) --On the inside of his first toe he does have a ulceration.  First toe slight erythema.   --X-ray shows some soft tissue swelling of the plantar forefoot with small erosion of the underlying sesamoid suspicious for osteomyelitis. --  MRI commented on possible osteomyelitis of the sesamoid bones and first phalanx.  -- Vancomycin and Unasyn started on 08/21/2021 and ok to change to po doxycycline per Dr Posey Pronto per podiatry.  Podiatry does not think that this is osteomyelitis.     DKA (diabetic ketoacidosis) ([place]) --pt will cont home Basalgar and SSI for now. HE is in the process of getting insulin pump thru his endocrinology--  Hemoglobin A1c at 11.  Patient working on setting up insulin pump at home. --he follows with [facility] endocrinology  Nausea vomiting and diarrhea -- improved.  Patient is eating better.  Acute renal failure superimposed on stage 3a chronic kidney  disease ([place]) Cr went from 1.96 down to 0.8. --stable  GERD (gastroesophageal reflux disease) --On protonix  PAD (peripheral artery disease) ([facility]) --Abnormal ABI -vascular consulted.  --Dr [name] --s/p Left LE angiogram  Percutaneous transluminal angioplasty of left tibioperoneal trunk and proximal posterior tibial artery with 3 mm diameter by 15 cm length angioplasty balloon. Percutaneous transluminal angioplasty of the left SFA with 6 mm diameter by 15 cm length Lutonix drug-coated angioplasty balloon.Stent placement to the left SFA for residual stenosis after angioplasty with 7 mm diameter by 8 cm length life stent --h/o Smoking int he past , HTN and Diabetes --asa +plavix per dr dew  Hypokalemia Replaced  Essential hypertension Continue lisinopril 10 mg daily   okay to go home per podiatry and vascular surgery. D/w pt and  Family.      Consultants: podiatry, vascular Procedures performed: left lower extremity angiogram Disposition: Home Diet recommendation:  Discharge Diet Orders (From admission, onward)     Start     Ordered   08/25/21 0000  Diet - low sodium heart healthy        08/25/21 1359           Cardiac and Carb modified diet DISCHARGE MEDICATION: Allergies as of 08/25/2021   No Known Allergies      Medication List     STOP taking these medications    Levemir FlexPen 100 UNIT/ML FlexPen Generic drug: insulin detemir       TAKE these medications    aspirin EC 81 MG tablet Take 1 tablet (81 mg  total) by mouth daily. Swallow whole. Start taking on: August 26, 2021   atorvastatin 40 MG tablet Commonly known as: LIPITOR Take 40 mg by mouth daily.   Basaglar KwikPen 100 UNIT/ML Inject 30 Units into the skin at bedtime. What changed: how much to take   blood glucose meter kit and supplies Kit Dispense based on patient and insurance preference. Use three times a day   clopidogrel 75 MG tablet Commonly known as: PLAVIX Take 1 tablet (75 mg  total) by mouth daily. Start taking on: August 26, 2021   doxycycline 100 MG tablet Commonly known as: VIBRA-TABS Take 1 tablet (100 mg total) by mouth every 12 (twelve) hours for 8 days.   famotidine 40 MG tablet Commonly known as: PEPCID Take 1 tablet by mouth daily.   insulin aspart 100 UNIT/ML FlexPen Commonly known as: NOVOLOG Inject 0-13 Units into the skin 3 (three) times daily with meals. Per sliding scale on smart phone. What changed: Another medication with the same name was removed. Continue taking this medication, and follow the directions you see here.   lisinopril 10 MG tablet Commonly known as: ZESTRIL Take 1 tablet (10 mg total) by mouth daily. Start taking on: August 26, 2021   Multi-Vitamins Tabs Take by mouth.   multivitamin-lutein Caps capsule Take 1 capsule by mouth daily.   mupirocin ointment 2 % Commonly known as: BACTROBAN Apply 1 application topically daily.   sildenafil 100 MG tablet Commonly known as: VIAGRA Take 100 mg by mouth daily as needed.        Follow-up Information     Derinda Late, MD. Schedule an appointment as soon as possible for a visit in 1 week(s).   Specialty: Family Medicine Why: hospital f/u Contact information: [address] [facility] [place] [address] [phone]         Algernon Huxley, MD. Schedule an appointment as soon as possible for a visit in 3 week(s).   Specialties: Vascular Surgery, Radiology, Interventional Cardiology Why: PAD f/u Contact information: [address] [address] [phone]         Criselda Peaches, DPM. Schedule an appointment as soon as possible for a visit.   Specialty: Podiatry Why: left diabetic foot infection Contact information: [place] [address] [phone]                Discharge Exam: Danley Danker Weights   08/18/21 1915 08/18/21 2300  Weight: 79.4 kg 67.6 kg     Condition at  discharge: good  The results of significant diagnostics from this hospitalization (including imaging, microbiology, ancillary and laboratory) are listed below for reference.   Imaging Studies: PERIPHERAL VASCULAR CATHETERIZATION  Result Date: 08/25/2021 See surgical note for result.  US ARTERIAL ABI (SCREENING LOWER EXTREMITY)  Result Date: 08/22/2021 CLINICAL DATA:  Diabetic foot ulcer EXAM: NONINVASIVE PHYSIOLOGIC VASCULAR STUDY OF BILATERAL LOWER EXTREMITIES TECHNIQUE: Evaluation of both lower extremities were performed at rest, including calculation of ankle-brachial indices with single level Doppler, pressure and pulse volume recording. COMPARISON:  None Available. FINDINGS: Right ABI:  0.94 Left ABI:  0.76 Right Lower Extremity: Normal triphasic arterial waveforms at the ankle. Left Lower Extremity: Dampened biphasic/monophasic waveforms at the ankle. IMPRESSION: 1. Mildly abnormal right lower extremity ABI 0.94 with normal arterial waveforms at the ankle. 2. Moderately abnormal left lower extremity ABI 0.76 with dampened arterial waveforms at the ankle. Electronically Signed   By: Scherrie Bateman.D.  On: 08/22/2021 16:01   MR FOOT LEFT WO CONTRAST  Result Date: 08/21/2021 CLINICAL DATA:  Foot swelling, diabetic, osteomyelitis suspected, xray done EXAM: MRI OF THE LEFT FOOT WITHOUT CONTRAST TECHNIQUE: Multiplanar, multisequence MR imaging of the left forefoot was performed. No intravenous contrast was administered. COMPARISON:  Left foot MRI 09/27/2020. FINDINGS: Bones/Joint/Cartilage There is severe first metatarsophalangeal joint osteoarthritis, with mild subchondral marrow edema and hallux valgus deformity. There is progressive fragmentation of the medial great toe sesamoid with marrow edema and low T1 signal. Milder signal abnormality in the lateral sesamoid. Unchanged undersurface erosion at the base of the first metatarsal head and marginal marrow edema signal (series 8, image 23).  Preserved T1 marrow signal in the great toe phalanges. 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 There is no other significant marrow signal alteration. Ligaments Intact Lisfranc ligament. Muscles and Tendons No acute tendon tear. There is diffuse intramuscular edema and atrophy in the foot as is commonly seen in diabetics. Soft tissues Mild soft tissue swelling of the foot and more focally along the great toe. There is susceptibility artifact along the medial soft tissues (series 5, images 23-24), without foreign body visualized radiographically. Focal callus formation along the plantar aspect of the second metatarsal head. IMPRESSION: Progressive fragmentation of the medial great toe sesamoid with marrow signal abnormality, mild marrow edema of the lateral sesamoid, and adjacent marrow edema along the plantar surface of the first metatarsal head with unchanged undersurface erosion. 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 Medial forefoot susceptibility artifact and soft tissue swelling, suspicious for the presence of an ulcer and possible foreign body, though there is no radiopaque foreign body identified on same day radiograph. Correlate with exam. No evidence of soft tissue abscess. 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 Electronically Signed   By: Maurine Simmering M.D.   On: 08/21/2021 14:48   DG Foot 2 Views Left  Result Date: 08/21/2021 CLINICAL DATA:  Diabetic left great toe ulcer EXAM: LEFT FOOT - 2 VIEW COMPARISON:  09/27/2020 FINDINGS: Interval transmetatarsal resection of the fifth digit. Amputation of the second digit again seen. Advanced degenerative changes of the first metatarsal phalangeal joint again seen. Soft tissue  swelling noted along the plantar aspect of the forefoot. A small erosion is suspected in the anterior aspect of the sesamoid underlying the region of soft tissue swelling. IMPRESSION: 1. Soft tissue swelling of the plantar forefoot with small erosion of the underlying sesamoid suspicious for osteomyelitis. 2. Postsurgical changes of second and fifth toe amputation are seen. Electronically Signed   By: Miachel Roux M.D.   On: 08/21/2021 09:21   DG ABD ACUTE 2+V W 1V CHEST  Result Date: 08/18/2021 CLINICAL DATA:  Vomiting and abdominal pain EXAM: DG ABDOMEN ACUTE WITH 1 VIEW CHEST COMPARISON:  03/12/2020 FINDINGS: There is no evidence of dilated bowel loops or free intraperitoneal air. No radiopaque calculi or other significant radiographic abnormality is seen. Heart size and mediastinal contours are within normal limits. Both lungs are clear. IMPRESSION: Negative abdominal radiographs.  No acute cardiopulmonary disease. Electronically Signed   By: Donavan Foil M.D.   On: 08/18/2021 22:12    Microbiology: Results for orders placed or performed during the hospital encounter of 08/18/21  MRSA Next Gen by PCR, Nasal  Status: Abnormal   Collection Time: 08/21/21  3:20 PM   Specimen: Nasal Mucosa; Nasal Swab  Result Value Ref Range Status   MRSA by PCR Next Gen DETECTED (A) NOT DETECTED Final    Comment: RESULT CALLED TO, READ BACK BY AND VERIFIED WITH: CRYSTAL BURNETT 08/21/21 1649 MU (NOTE) The GeneXpert MRSA Assay (FDA approved for NASAL specimens only), is one component of a comprehensive MRSA colonization surveillance program. It is not intended to diagnose MRSA infection nor to guide or monitor treatment for MRSA infections. Test performance is not FDA approved in patients less than 32 years old. Performed at [facility], [address] [place], [place] [address]     Labs: CBC: Recent Labs  Lab 08/18/21 1920 08/22/21 0351  WBC 9.4 4.3  NEUTROABS 7.9*  --   HGB 14.5  13.6  HCT 45.2 41.4  MCV 94.6 92.4  PLT 198 640*   Basic Metabolic Panel: Recent Labs  Lab 08/19/21 0421 08/19/21 0808 08/20/21 0420 08/21/21 0427 08/22/21 0351 08/24/21 0328 08/25/21 0406  NA 139 140 138 139 139  --   --   K 4.0 3.8 3.4* 3.6 3.9  --   --   CL 109 108 105 99 103  --   --   CO2 21* 25 29 34* 30  --   --   GLUCOSE 225* 111* 78 237* 336*  --   --   BUN 41* 35* 22* 20 24*  --   --   CREATININE 1.36* 1.30* 1.06 0.97 0.80 1.21 1.00  CALCIUM 8.4* 8.5* 8.5* 9.2 8.6*  --   --    Liver Function Tests: Recent Labs  Lab 08/18/21 1920  AST 18  ALT 21  ALKPHOS 94  BILITOT 1.5*  PROT 7.1  ALBUMIN 3.9   CBG: Recent Labs  Lab 08/25/21 0955 08/25/21 1102 08/25/21 1125 08/25/21 1144 08/25/21 1202  GLUCAP 150* 69* 64* 82 112*    Discharge time spent: greater than 30 minutes.  Signed: Fritzi Mandes, MD [facility] 08/25/2021

## 2021-08-25 NOTE — Op Note (Signed)
[facility]  Percutaneous Study/Intervention Procedural Note   Date of Surgery: 08/25/2021  Surgeon(s):Trinity Haun    Assistants:none  Pre-operative Diagnosis: PAD with ulceration and infection left lower extremity  Post-operative diagnosis:  Same  Procedure(s) Performed:             1.  Ultrasound guidance for vascular access right femoral artery             2.  Catheter placement into left common femoral artery from right femoral approach             3.  Aortogram and selective left lower extremity angiogram             4.  Percutaneous transluminal angioplasty of left tibioperoneal trunk and proximal posterior tibial artery with 3 mm diameter by 15 cm length angioplasty balloon             5.  Percutaneous transluminal angioplasty of the left SFA with 6 mm diameter by 15 cm length Lutonix drug-coated angioplasty balloon  6.  Stent placement to the left SFA for residual stenosis after angioplasty with 7 mm diameter by 8 cm length life stent             7.  StarClose closure device right femoral artery  EBL: 5 cc  Contrast: 45 cc  Fluoro Time: 3.1 minutes  Moderate Conscious Sedation Time: approximately 20 minutes using 4 mg of Versed and 75 mcg of Fentanyl              Indications:  Patient is a 56 y.o.male with infection and ulceration of the left foot. The patient is brought in for angiography for further evaluation and potential treatment.  Due to the limb threatening nature of the situation, angiogram was performed for attempted limb salvage. The patient is aware that if the procedure fails, amputation would be expected.  The patient also understands that even with successful revascularization, amputation may still be required due to the severity of the situation.  Risks and benefits are discussed and informed consent is obtained.   Procedure:  The patient was identified and appropriate procedural time out was performed.  The patient was then placed supine  on the table and prepped and draped in the usual sterile fashion. Moderate conscious sedation was administered during a face to face encounter with the patient throughout the procedure with my supervision of the RN administering medicines and monitoring the patient's vital signs, pulse oximetry, telemetry and mental status throughout from the start of the procedure until the patient was taken to the recovery room. Ultrasound was used to evaluate the right common femoral artery.  It was patent .  A digital ultrasound image was acquired.  A Seldinger needle was used to access the right common femoral artery under direct ultrasound guidance and a permanent image was performed.  A 0.035 J wire was advanced without resistance and a 5Fr sheath was placed.  Pigtail catheter was placed into the aorta and an AP aortogram was performed. This demonstrated normal renal arteries and normal aorta and iliac segments without significant stenosis. I then crossed the aortic bifurcation and advanced to the left femoral head. Selective left lower extremity angiogram was then performed. This demonstrated normal common femoral artery and profunda femoris artery.  The mid to distal SFA had moderate stenosis in the 60 to 70% range that was significantly calcific.  The distal SFA and popliteal artery were fairly normal.  The patient had two-vessel runoff distally  but there was about a 60 to 70% stenosis in the tibioperoneal trunk and about an 80% stenosis in the proximal to mid posterior tibial artery which was the dominant runoff distally.  The peroneal artery provided a second runoff vessel.  The anterior tibial artery was chronically occluded.. It was felt that it was in the patient's best interest to proceed with intervention after these images to avoid a second procedure and a larger amount of contrast and fluoroscopy based off of the findings from the initial angiogram. The patient was systemically heparinized and a 6 [place] [employer]  sheath was then placed over the [employer] wire. I then used a Kumpe catheter and the advantage wire to cross the stenosis in the SFA and the posterior tibial artery and tibioperoneal trunk without difficulty.  I then proceeded with treatment.  A 3 mm diameter by 15 cm length angioplasty balloon was inflated to 12 atm for 1 minute in the tibioperoneal trunk down to the proximal to mid posterior tibial artery.  Completion imaging showed less than 25% residual stenosis after angioplasty.  I then turned my attention the SFA.  A 6 mm diameter by 15 cm length Lutonix drug-coated angioplasty balloon was inflated to 6 atm for 1 minute.  Completion imaging showed greater than 50% residual stenosis in the mid segment of this balloon inflation and a 7 mm diameter by 8 cm length life stent was deployed postdilated with a 6 mm diameter Lutonix drug-coated balloon with excellent angiographic completion result and less than 10% residual stenosis. I elected to terminate the procedure. The sheath was removed and StarClose closure device was deployed in the right femoral artery with excellent hemostatic result. The patient was taken to the recovery room in stable condition having tolerated the procedure well.  Findings:               Aortogram:  This demonstrated normal renal arteries and normal aorta and iliac segments without significant stenosis.             Left Lower Extremity: This demonstrated normal common femoral artery and profunda femoris artery.  The mid to distal SFA had moderate stenosis in the 60 to 70% range that was significantly calcific.  The distal SFA and popliteal artery were fairly normal.  The patient had two-vessel runoff distally but there was about a 60 to 70% stenosis in the tibioperoneal trunk and about an 80% stenosis in the proximal to mid posterior tibial artery which was the dominant runoff distally.  The peroneal artery provided a second runoff vessel.  The anterior tibial artery was  chronically occluded.   Disposition: Patient was taken to the recovery room in stable condition having tolerated the procedure well.  Complications: None  [name] 08/25/2021 10:56 AM   This note was created with Dragon Medical transcription system. Any errors in dictation are purely unintentional.

## 2021-08-25 NOTE — Plan of Care (Signed)
Patient discharged per MD orders at this time.All discharge instructions,education and medications reviewed with the patient.Pt expressed understanding and will comply with dc instructions.f/u appointments was also communicated to the patient.no verbal c/o or any ssx of distress.Pt was discharged home with self-care per order.Pt was transported home by girlfriend in a privately owned vehicle. 

## 2021-08-25 NOTE — Inpatient Diabetes Management (Signed)
Inpatient Diabetes Program Recommendations  AACE/ADA: New Consensus Statement on Inpatient Glycemic Control (2015)  Target Ranges:  Prepandial:   less than 140 mg/dL      Peak postprandial:   less than 180 mg/dL (1-2 hours)      Critically ill patients:  140 - 180 mg/dL   Lab Results  Component Value Date   GLUCAP 306 (H) 08/25/2021   HGBA1C 11.0 (H) 08/18/2021    Review of Glycemic Control  Diabetes history: DM1 (does not make any insulin; requires basal, correction, and meal coverage insulin) Home DM Meds: Basaglar 28 units QHS     Novolog for correction and meal coverage (uses InPen app on his phone to calculate dose of Novolog)   Current Orders: Semglee 20 units QHS                           Novolog 0-15  units TID AC + HS                           Novolog 5 units TID with meals  Inpatient Diabetes Program Recommendations:   Patient did not receive last hs basal due to CBG <150 per note. -Please give Semglee as soon as possible. -Decrease Novolog correction to 0-9 units tid + hs 0-5 units  Thank you, Bethena Roys E. Tesla Keeler, RN, MSN, CDE  Diabetes Coordinator Inpatient Glycemic Control Team Team Pager [phone] (8am-5pm) 08/25/2021 9:59 AM

## 2021-08-25 NOTE — Interval H&P Note (Signed)
History and Physical Interval Note:  08/25/2021 9:34 AM  Leonard Johnston  has presented today for surgery, with the diagnosis of Perpheral arterial disease with ulceration.  The various methods of treatment have been discussed with the patient and family. After consideration of risks, benefits and other options for treatment, the patient has consented to  Procedure(s): Lower Extremity Angiography (Left) as a surgical intervention.  The patient's history has been reviewed, patient examined, no change in status, stable for surgery.  I have reviewed the patient's chart and labs.  Questions were answered to the patient's satisfaction.     [name]

## 2021-08-25 NOTE — Progress Notes (Signed)
At 2112 patient's blood glucose was 118. I was instructed to hold both long acting and short acting insulin if the blood glucose is <150. At [phone] the patient wanted his blood glucose check to see what it was and it was 364, patient was worried about receiving insulin and having his blood glucose drop very low due to being NPO. He is scheduled to have his blood glucose rechecked at 8am. Will continue to monitor.

## 2021-08-25 NOTE — Care Management Important Message (Signed)
Important Message  Patient Details  Name: XYON LUKASIK MRN: [id number] Date of Birth: 05-Dec-1965   Medicare Important Message Given:  Yes     Juliann Pulse A Icarus Partch 08/25/2021, 12:07 PM

## 2021-08-25 NOTE — Discharge Instructions (Signed)
Patient to keep log of his sugars at home. He will continue taking his Basalgar and SSI until he is on insulin pump. Patient follows with endocrinology at [facility].

## 2021-09-15 ENCOUNTER — Ambulatory Visit (INDEPENDENT_AMBULATORY_CARE_PROVIDER_SITE_OTHER): Payer: Medicare HMO | Admitting: Vascular Surgery

## 2021-09-25 ENCOUNTER — Emergency Department: Payer: 59

## 2021-09-25 ENCOUNTER — Observation Stay
Admit: 2021-09-25 | Discharge: 2021-09-25 | Disposition: A | Discharge status: Home / Self Care | Payer: 59 | Attending: Internal Medicine | Admitting: Internal Medicine

## 2021-09-25 ENCOUNTER — Inpatient Hospital Stay
Admission: EM | Admit: 2021-09-25 | Discharge: 2021-09-27 | DRG: 247 | Disposition: A | Discharge status: Home / Self Care | Payer: 59 | Attending: Hospitalist | Admitting: Hospitalist

## 2021-09-25 ENCOUNTER — Other Ambulatory Visit: Payer: Self-pay

## 2021-09-25 DIAGNOSIS — E10319 Type 1 diabetes mellitus with unspecified diabetic retinopathy without macular edema: Secondary | ICD-10-CM | POA: Diagnosis present

## 2021-09-25 DIAGNOSIS — Z7902 Long term (current) use of antithrombotics/antiplatelets: Secondary | ICD-10-CM

## 2021-09-25 DIAGNOSIS — E103599 Type 1 diabetes mellitus with proliferative diabetic retinopathy without macular edema, unspecified eye: Secondary | ICD-10-CM

## 2021-09-25 DIAGNOSIS — R911 Solitary pulmonary nodule: Secondary | ICD-10-CM

## 2021-09-25 DIAGNOSIS — I25110 Atherosclerotic heart disease of native coronary artery with unstable angina pectoris: Principal | ICD-10-CM | POA: Diagnosis present

## 2021-09-25 DIAGNOSIS — D696 Thrombocytopenia, unspecified: Secondary | ICD-10-CM | POA: Diagnosis present

## 2021-09-25 DIAGNOSIS — R079 Chest pain, unspecified: Secondary | ICD-10-CM | POA: Diagnosis not present

## 2021-09-25 DIAGNOSIS — K2100 Gastro-esophageal reflux disease with esophagitis, without bleeding: Secondary | ICD-10-CM

## 2021-09-25 DIAGNOSIS — Z885 Allergy status to narcotic agent status: Secondary | ICD-10-CM

## 2021-09-25 DIAGNOSIS — Z794 Long term (current) use of insulin: Secondary | ICD-10-CM

## 2021-09-25 DIAGNOSIS — I200 Unstable angina: Secondary | ICD-10-CM

## 2021-09-25 DIAGNOSIS — I10 Essential (primary) hypertension: Secondary | ICD-10-CM | POA: Diagnosis present

## 2021-09-25 DIAGNOSIS — Z87891 Personal history of nicotine dependence: Secondary | ICD-10-CM

## 2021-09-25 DIAGNOSIS — Z813 Family history of other psychoactive substance abuse and dependence: Secondary | ICD-10-CM

## 2021-09-25 DIAGNOSIS — Z7982 Long term (current) use of aspirin: Secondary | ICD-10-CM

## 2021-09-25 DIAGNOSIS — I739 Peripheral vascular disease, unspecified: Secondary | ICD-10-CM | POA: Diagnosis present

## 2021-09-25 DIAGNOSIS — E1065 Type 1 diabetes mellitus with hyperglycemia: Secondary | ICD-10-CM | POA: Diagnosis present

## 2021-09-25 DIAGNOSIS — Z9862 Peripheral vascular angioplasty status: Secondary | ICD-10-CM

## 2021-09-25 DIAGNOSIS — E1051 Type 1 diabetes mellitus with diabetic peripheral angiopathy without gangrene: Secondary | ICD-10-CM | POA: Diagnosis present

## 2021-09-25 DIAGNOSIS — K219 Gastro-esophageal reflux disease without esophagitis: Secondary | ICD-10-CM | POA: Diagnosis present

## 2021-09-25 DIAGNOSIS — Z79899 Other long term (current) drug therapy: Secondary | ICD-10-CM

## 2021-09-25 DIAGNOSIS — E11319 Type 2 diabetes mellitus with unspecified diabetic retinopathy without macular edema: Secondary | ICD-10-CM | POA: Diagnosis present

## 2021-09-25 DIAGNOSIS — Z811 Family history of alcohol abuse and dependence: Secondary | ICD-10-CM

## 2021-09-25 DIAGNOSIS — E109 Type 1 diabetes mellitus without complications: Secondary | ICD-10-CM | POA: Diagnosis present

## 2021-09-25 DIAGNOSIS — E7800 Pure hypercholesterolemia, unspecified: Secondary | ICD-10-CM | POA: Diagnosis present

## 2021-09-25 LAB — ECHOCARDIOGRAM COMPLETE
AR max vel: 3.35 cm2
AV Area VTI: 2.96 cm2
AV Area mean vel: 2.92 cm2
AV Mean grad: 2 mmHg
AV Peak grad: 4.2 mmHg
Ao pk vel: 1.02 m/s
Area-P 1/2: 3.42 cm2
Height: 81 in
S' Lateral: 3.09 cm
Weight: 2896 oz

## 2021-09-25 LAB — HEPATIC FUNCTION PANEL
ALT: 16 U/L (ref 0–44)
AST: 23 U/L (ref 15–41)
Albumin: 3.9 g/dL (ref 3.5–5.0)
Alkaline Phosphatase: 78 U/L (ref 38–126)
Bilirubin, Direct: 0.1 mg/dL (ref 0.0–0.2)
Indirect Bilirubin: 0.7 mg/dL (ref 0.3–0.9)
Total Bilirubin: 0.8 mg/dL (ref 0.3–1.2)
Total Protein: 6.9 g/dL (ref 6.5–8.1)

## 2021-09-25 LAB — BASIC METABOLIC PANEL
Anion gap: 8 (ref 5–15)
BUN: 38 mg/dL — ABNORMAL HIGH (ref 6–20)
CO2: 26 mmol/L (ref 22–32)
Calcium: 8.9 mg/dL (ref 8.9–10.3)
Chloride: 108 mmol/L (ref 98–111)
Creatinine, Ser: 1.37 mg/dL — ABNORMAL HIGH (ref 0.61–1.24)
GFR, Estimated: >60 mL/min (ref >60)
Glucose, Bld: 146 mg/dL — ABNORMAL HIGH (ref 70–99)
Potassium: 4.3 mmol/L (ref 3.5–5.1)
Sodium: 142 mmol/L (ref 135–145)

## 2021-09-25 LAB — CBG MONITORING, ED
Glucose-Capillary: 194 mg/dL — ABNORMAL HIGH (ref 70–99)
Glucose-Capillary: 255 mg/dL — ABNORMAL HIGH (ref 70–99)

## 2021-09-25 LAB — CBC
HCT: 41.9 % (ref 39.0–52.0)
Hemoglobin: 13.6 g/dL (ref 13.0–17.0)
MCH: 30.8 pg (ref 26.0–34.0)
MCHC: 32.5 g/dL (ref 30.0–36.0)
MCV: 94.8 fL (ref 80.0–100.0)
Platelets: 136 10*3/uL — ABNORMAL LOW (ref 150–400)
RBC: 4.42 MIL/uL (ref 4.22–5.81)
RDW: 14.1 % (ref 11.5–15.5)
WBC: 4.2 10*3/uL (ref 4.0–10.5)
nRBC: 0 % (ref 0.0–0.2)

## 2021-09-25 LAB — TROPONIN I (HIGH SENSITIVITY)
Troponin I (High Sensitivity): 5 ng/L (ref <18)
Troponin I (High Sensitivity): 4 ng/L (ref <18)

## 2021-09-25 LAB — GLUCOSE, CAPILLARY: Glucose-Capillary: 201 mg/dL — ABNORMAL HIGH (ref 70–99)

## 2021-09-25 LAB — APTT: aPTT: 33 seconds (ref 24–36)

## 2021-09-25 LAB — PROTIME-INR
INR: 1 (ref 0.8–1.2)
Prothrombin Time: 13.5 seconds (ref 11.4–15.2)

## 2021-09-25 LAB — LIPASE, BLOOD: Lipase: 27 U/L (ref 11–51)

## 2021-09-25 MED ORDER — ONDANSETRON HCL 4 MG/2ML IJ SOLN
4.0000 mg | INTRAMUSCULAR | Status: AC
  Administered 2021-09-25: 4 mg via INTRAVENOUS
  Filled 2021-09-25: qty 2

## 2021-09-25 MED ORDER — LISINOPRIL 10 MG PO TABS
10.0000 mg | ORAL_TABLET | Freq: Every day | ORAL | Status: DC
  Administered 2021-09-27: 10 mg via ORAL
  Filled 2021-09-25 (×2): qty 1

## 2021-09-25 MED ORDER — HYDROMORPHONE HCL 1 MG/ML IJ SOLN
1.0000 mg | INTRAMUSCULAR | Status: AC
  Administered 2021-09-25: 1 mg via INTRAVENOUS
  Filled 2021-09-25: qty 1

## 2021-09-25 MED ORDER — ASPIRIN 81 MG PO CHEW
324.0000 mg | CHEWABLE_TABLET | Freq: Once | ORAL | Status: AC
Start: 2021-09-25 — End: 2021-09-25
  Administered 2021-09-25: 324 mg via ORAL
  Filled 2021-09-25: qty 4

## 2021-09-25 MED ORDER — BASAGLAR KWIKPEN 100 UNIT/ML ~~LOC~~ SOPN: 28.0000 [IU] | PEN_INJECTOR | Freq: Every day | SUBCUTANEOUS | Status: DC

## 2021-09-25 MED ORDER — ADULT MULTIVITAMIN W/MINERALS CH
1.0000 | ORAL_TABLET | Freq: Every day | ORAL | Status: DC
Start: 2021-09-26 — End: 2021-09-27
  Administered 2021-09-27: 1 via ORAL
  Filled 2021-09-25 (×2): qty 1

## 2021-09-25 MED ORDER — MORPHINE SULFATE (PF) 2 MG/ML IV SOLN
2.0000 mg | INTRAVENOUS | Status: DC | PRN
  Administered 2021-09-25 (×3): 2 mg via INTRAVENOUS
  Filled 2021-09-25 (×3): qty 1

## 2021-09-25 MED ORDER — IOHEXOL 350 MG/ML SOLN
100.0000 mL | Freq: Once | INTRAVENOUS | Status: AC | PRN
  Administered 2021-09-25: 100 mL via INTRAVENOUS

## 2021-09-25 MED ORDER — NITROGLYCERIN 0.4 MG SL SUBL
0.4000 mg | SUBLINGUAL_TABLET | SUBLINGUAL | Status: DC | PRN
  Administered 2021-09-25: 0.4 mg via SUBLINGUAL
  Filled 2021-09-25: qty 1

## 2021-09-25 MED ORDER — ATORVASTATIN CALCIUM 20 MG PO TABS: 40.0000 mg | ORAL_TABLET | Freq: Every evening | ORAL | Status: DC

## 2021-09-25 MED ORDER — INSULIN ASPART 100 UNIT/ML IJ SOLN: 0.0000 [IU] | Freq: Three times a day (TID) | INTRAMUSCULAR | Status: DC

## 2021-09-25 MED ORDER — METOPROLOL TARTRATE 25 MG PO TABS
12.5000 mg | ORAL_TABLET | Freq: Two times a day (BID) | ORAL | Status: DC
  Administered 2021-09-25 – 2021-09-27 (×4): 12.5 mg via ORAL
  Filled 2021-09-25 (×4): qty 1

## 2021-09-25 MED ORDER — INSULIN GLARGINE-YFGN 100 UNIT/ML ~~LOC~~ SOLN
28.0000 [IU] | Freq: Every day | SUBCUTANEOUS | Status: DC
  Administered 2021-09-25 – 2021-09-26 (×2): 28 [IU] via SUBCUTANEOUS
  Filled 2021-09-25 (×3): qty 0.28

## 2021-09-25 MED ORDER — PANTOPRAZOLE SODIUM 40 MG IV SOLR
40.0000 mg | INTRAVENOUS | Status: DC
  Administered 2021-09-25 – 2021-09-27 (×2): 40 mg via INTRAVENOUS
  Filled 2021-09-25 (×2): qty 10

## 2021-09-25 MED ORDER — ACETAMINOPHEN 325 MG PO TABS
650.0000 mg | ORAL_TABLET | ORAL | Status: DC | PRN
  Filled 2021-09-25: qty 2

## 2021-09-25 MED ORDER — CLOPIDOGREL BISULFATE 75 MG PO TABS
75.0000 mg | ORAL_TABLET | Freq: Every day | ORAL | Status: DC
  Administered 2021-09-26 – 2021-09-27 (×2): 75 mg via ORAL
  Filled 2021-09-25 (×2): qty 1

## 2021-09-25 MED ORDER — FAMOTIDINE 20 MG PO TABS: 40.0000 mg | ORAL_TABLET | Freq: Every day | ORAL | Status: DC

## 2021-09-25 MED ORDER — HYDROMORPHONE HCL 1 MG/ML IJ SOLN
1.0000 mg | INTRAMUSCULAR | Status: DC | PRN
  Administered 2021-09-26 (×2): 1 mg via INTRAVENOUS
  Filled 2021-09-25 (×2): qty 1

## 2021-09-25 MED ORDER — INSULIN PUMP
SUBCUTANEOUS | Status: DC
  Filled 2021-09-25: qty 1

## 2021-09-25 MED ORDER — SODIUM CHLORIDE 0.9% FLUSH
3.0000 mL | Freq: Two times a day (BID) | INTRAVENOUS | Status: DC
  Administered 2021-09-25 – 2021-09-27 (×4): 3 mL via INTRAVENOUS

## 2021-09-25 MED ORDER — ASPIRIN 81 MG PO TBEC
81.0000 mg | DELAYED_RELEASE_TABLET | Freq: Every day | ORAL | Status: DC
  Administered 2021-09-26 – 2021-09-27 (×2): 81 mg via ORAL
  Filled 2021-09-25 (×2): qty 1

## 2021-09-25 MED ORDER — ONDANSETRON HCL 4 MG/2ML IJ SOLN
4.0000 mg | Freq: Four times a day (QID) | INTRAMUSCULAR | Status: DC | PRN
  Administered 2021-09-25 – 2021-09-26 (×3): 4 mg via INTRAVENOUS
  Filled 2021-09-25 (×3): qty 2

## 2021-09-25 MED ORDER — INSULIN ASPART 100 UNIT/ML IJ SOLN
0.0000 [IU] | Freq: Three times a day (TID) | INTRAMUSCULAR | Status: DC
  Administered 2021-09-25: 5 [IU] via SUBCUTANEOUS
  Administered 2021-09-26: 3 [IU] via SUBCUTANEOUS
  Administered 2021-09-26: 5 [IU] via SUBCUTANEOUS
  Administered 2021-09-27: 2 [IU] via SUBCUTANEOUS
  Filled 2021-09-25 (×4): qty 1

## 2021-09-25 MED ORDER — NITROGLYCERIN 0.4 MG SL SUBL
0.4000 mg | SUBLINGUAL_TABLET | SUBLINGUAL | Status: AC
  Administered 2021-09-25: 0.4 mg via SUBLINGUAL

## 2021-09-25 MED ORDER — ENOXAPARIN SODIUM 40 MG/0.4ML IJ SOSY
40.0000 mg | PREFILLED_SYRINGE | INTRAMUSCULAR | Status: DC
  Administered 2021-09-25: 40 mg via SUBCUTANEOUS
  Filled 2021-09-25: qty 0.4

## 2021-09-25 NOTE — Consult Note (Addendum)
[place] NOTE       Patient ID: Leonard Johnston MRN: [id number] DOB/AGE: 1965/09/17 56 y.o.  Admit date: 09/25/2021 Referring Physician Dr. Francine Graven Primary Physician Dr. Baldemar Lenis Primary Cardiologist none Reason for Consultation chest pain  HPI: Leonard Johnston is a 31yoM with a PMH of poorly controlled type 1 diabetes (A1C 11%), PAD s/p left femoral artery stent x 2 07/2021, hypertension, GERD, former tobacco use, who presented to [facility] ED 09/25/21 with chest pain. Cardiology is consulted for further assistance.   The patient states this morning he walked to breakfast and ate bacon and egg croissant between 7 and 8 AM when afterwards he started hurting the left side of his chest while he was walking.  He described the pain as sharp, lasting less than 10 seconds but coming and going frequently with his heartbeats it seemed.  This discomfort went from the left side of his chest into his left shoulder and back underneath his left shoulder blade.  He also felt a lump in his throat and had difficulty swallowing around the same time, which is now improved and is better when he lies down.  The discomfort takes his breath away and he felt somewhat dizzy with it earlier.  He was given nitroglycerin and Dilaudid x2 with some improvement, however the left-sided pain reoccurred during interview, lasting <1 minute before going away. He has never had the symptoms before and cannot tell me anything that makes it worse.  The discomfort is not relieved by change in positions or seemingly worsened with exertion.  He has been compliant with his Plavix since his SFA stent placement.  He used to smoke 2 cigars/day but quit 13 years ago.  He does not drink alcohol or use illicit substances.  He used to run frequently for exercise, but has not done this lately due to issues with peripheral neuropathy. 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  Vitals are notable for  a blood pressure initially 166/100, improving to 140/94 on repeat heart rate 80 in sinus rhythm on telemetry.  Saturating well on room air.  Labs are notable for potassium of 4.3, BUN/creatinine 38/1.37, GFR greater than 60.  High-sensitivity troponin negative on 2 repeats at 5-4.  H&H at baseline at 13.6/41, thrombocytopenia around baseline with platelets 136.  Hemoglobin A1c from 07/2021 was elevated at 11%  Chest x-ray without active cardiopulmonary disease.  CTA chest abdomen pelvis negative for aortic aneurysm, dissection, or intramural hematoma, but did reveal reflux versus retained contrast and a patulous proximal esophagus with mid distal esophageal wall thickening for which correlation for symptoms of esophagitis/GERD were recommended, in addition to a spiculated 10 mm right solid pulmonary nodule for which follow-up CT versus tissue biopsy was recommended.  Review of systems complete and found to be negative unless listed above   Past Medical History:  Diagnosis Date   Anxiety    Diabetes mellitus type I ([employer])    Diabetes mellitus without complication ([place])    type 1   GERD (gastroesophageal reflux disease)    Hypercholesteremia     Past Surgical History:  Procedure Laterality Date   amputaion of toe     COLONOSCOPY WITH PROPOFOL N/A 06/24/2017   Procedure: COLONOSCOPY WITH PROPOFOL;  Surgeon: Manya Silvas, MD;  Location: [facility] END[facility]COPY;  Service: Endoscopy;  Laterality: N/A;   ESOPHAGOGASTRODUODENOSCOPY (EGD) WITH PROPOFOL N/A 06/24/2017   Procedure: ESOPHAGOGASTRODUODENOSCOPY (EGD) WITH PROPOFOL;  Surgeon: Vira Agar,  Gavin Pound, MD;  Location: [facility] ENDOSCOPY;  Service: Endoscopy;  Laterality: N/A;   EYE SURGERY     FEMORAL ARTERY STENT Left    LOWER EXTREMITY ANGIOGRAPHY Left 08/25/2021   Procedure: Lower Extremity Angiography;  Surgeon: Algernon Huxley, MD;  Location: [facility];  Service: Cardiovascular;  Laterality: Left;   TOE SURGERY      (Not in a hospital  admission)  Social History   Socioeconomic History   Marital status: Divorced    Spouse name: jullie   Number of children: 0   Years of education: Not on file   Highest education level: High school graduate  Occupational History   Not on file  Tobacco Use   Smoking status: Former    Types: Cigarettes    Quit date: 06/24/2009    Years since quitting: 12.2   Smokeless tobacco: Never  Vaping Use   Vaping Use: Never used  Substance and Sexual Activity   Alcohol use: No   Drug use: No   Sexual activity: Yes  Other Topics Concern   Not on file  Social History Narrative   Not on file   Social Determinants of Health   Financial Resource Strain: Medium Risk (10/29/2017)   Overall Financial Resource Strain (CARDIA)    Difficulty of Paying Living Expenses: Somewhat hard  Food Insecurity: Food Insecurity Present (10/29/2017)   Hunger Vital Sign    Worried About [place] in the Last Year: Often true    Ran Out of Food in the Last Year: Often true  Transportation Needs: No Transportation Needs (10/29/2017)   PRAPARE - Hydrologist (Medical): No    Lack of Transportation (Non-Medical): No  Physical Activity: Inactive (10/29/2017)   Exercise Vital Sign    Days of Exercise per Week: 0 days    Minutes of Exercise per Session: 0 min  Stress: Stress Concern Present (10/29/2017)   [place]    Feeling of Stress : Very much  Social Connections: Unknown (10/29/2017)   Social Connection and Isolation Panel [NHANES]    Frequency of Communication with Friends and Family: Not on file    Frequency of Social Gatherings with Friends and Family: Not on file    Attends Religious Services: Never    Active Member of Clubs or Organizations: No    Attends Archivist Meetings: Never    Marital Status: Married  Human resources officer Violence: Not At Risk (10/29/2017)   Humiliation, Afraid, Rape, and  Kick questionnaire    Fear of Current or Ex-Partner: No    Emotionally Abused: No    Physically Abused: No    Sexually Abused: No    Family History  Problem Relation Age of Onset   Alcohol abuse Sister    Drug abuse Sister    Alcohol abuse Sister    Drug abuse Sister       PHYSICAL EXAM General: Thin Caucasian male, well nourished, in no acute distress.  Laying flat in ED stretcher HEENT:  Normocephalic and atraumatic. Neck:  No JVD.  Lungs: Normal respiratory effort on room air. Clear bilaterally to auscultation. No wheezes, crackles, rhonchi.  Heart: HRRR . Normal S1 and S2. Friction rub present.  Abdomen: Non-distended appearing.  Msk: Normal strength and tone for age. Extremities: Warm and well perfused. No clubbing, cyanosis.  No peripheral edema.  Neuro: Alert and oriented X 3. Psych:  Answers questions appropriately.  Labs:   Lab Results  Component Value Date   WBC 4.2 09/25/2021   HGB 13.6 09/25/2021   HCT 41.9 09/25/2021   MCV 94.8 09/25/2021   PLT 136 (L) 09/25/2021    Recent Labs  Lab 09/25/21 0953  NA 142  K 4.3  CL 108  CO2 26  BUN 38*  CREATININE 1.37*  CALCIUM 8.9  PROT 6.9  BILITOT 0.8  ALKPHOS 78  ALT 16  AST 23  GLUCOSE 146*   No results found for: "CKTOTAL", "CKMB", "CKMBINDEX", "TROPONINI" No results found for: "CHOL" No results found for: "HDL" No results found for: "LDLCALC" Lab Results  Component Value Date   TRIG 235 (H) 03/12/2020   No results found for: "CHOLHDL" No results found for: "LDLDIRECT"    Radiology: CT Angio Chest/Abd/Pel for Dissection W and/or Wo Contrast  Result Date: 09/25/2021 CLINICAL DATA:  Acute aortic syndrome suspected. EXAM: CT ANGIOGRAPHY CHEST, ABDOMEN AND PELVIS TECHNIQUE: Non-contrast CT of the chest was initially obtained. Multidetector CT imaging through the chest, abdomen and pelvis was performed using the standard protocol during bolus administration of intravenous contrast. Multiplanar  reconstructed images and MIPs were obtained and reviewed to evaluate the vascular anatomy. RADIATION DOSE REDUCTION: This exam was performed according to the departmental dose-optimization program which includes automated exposure control, adjustment of the mA and/or kV according to patient size and/or use of iterative reconstruction technique. CONTRAST:  161m OMNIPAQUE IOHEXOL 350 MG/ML SOLN COMPARISON:  CT January 11, 2012 FINDINGS: CTA CHEST FINDINGS Cardiovascular: Non contrasted sequence demonstrates minimal aortic atherosclerosis without evidence of intramural hematoma. Preferential opacification of the thoracic aorta postcontrast administration, without evidence of thoracic aortic aneurysm or dissection. No central pulmonary embolus on this nondedicated study. Normal heart size. No pericardial effusion. Mediastinum/Nodes: No suspicious thyroid nodule. No pathologically enlarged mediastinal, hilar or axillary lymph nodes. Patulous proximal esophagus with retained/refluxed material in the distal esophagus and mild distal esophageal wall thickening. Lungs/Pleura: Spiculated 10 mm right upper lobe pulmonary nodule on image 67/6. Solid 5 mm subpleural right lower lobe pulmonary nodule on image 136/6. No pleural effusion. No pneumothorax. Musculoskeletal: No acute osseous abnormality. Multilevel degenerative changes spine. Review of the MIP images confirms the above findings. CTA ABDOMEN AND PELVIS FINDINGS VASCULAR Aorta: Aortic atherosclerosis. Normal caliber aorta without aneurysm, dissection, vasculitis or significant stenosis. Celiac: Patent without evidence of aneurysm, dissection, vasculitis or significant stenosis. SMA: Patent without evidence of aneurysm, dissection, vasculitis or significant stenosis. Renals: Renal arteries are patent without evidence of aneurysm, dissection, vasculitis, fibromuscular dysplasia or significant stenosis. IMA: Patent without evidence of aneurysm, dissection, vasculitis or  significant stenosis. Inflow: Patent without evidence of aneurysm, dissection, vasculitis or significant stenosis. Veins: No obvious venous abnormality within the limitations of this arterial phase study. Review of the MIP images confirms the above findings. NON-VASCULAR Hepatobiliary: Bilobar arterially hyperenhancing hepatic lesions largest of which is in the posterior right lobe of the liver measuring 3.3 cm on image 109/5 which corresponds with a hypodense lesion seen on prior CT abdomen pelvis dated January 11, 2012 additional lesion in the dome of the liver measures 10 mm on image 98/5 demonstrating peripheral nodular discontinuous enhancement and stable in size dating back to January 11, 2012, stability indicates a benign etiology with these lesions almost certainly reflecting benign hepatic hemangiomata. Gallbladder is unremarkable. No biliary ductal dilation. Pancreas: No pancreatic ductal dilation or evidence of acute inflammation. Spleen: No splenomegaly. Adrenals/Urinary Tract: Hypodense left interpolar renal lesion measures fluid density consistent with a cyst  and considered benign requiring no imaging follow-up. Urinary bladder is unremarkable for degree of distension. Stomach/Bowel: No radiopaque enteric contrast material was administered. Stomach is unremarkable for degree of distension. No pathologic dilation of small or large bowel. No evidence of acute bowel inflammation. Lymphatic: No pathologically enlarged abdominal or pelvic lymph nodes. Reproductive: Prostate is unremarkable. Other: No significant abdominopelvic free fluid. Musculoskeletal: No aggressive lytic or blastic lesion of bone. Review of the MIP images confirms the above findings. IMPRESSION: 1. No aortic aneurysm, dissection or intramural hematoma. 2. Spiculated 10 mm suspicious right solid pulmonary nodule within the upper lobe. Per Fleischner Society Guidelines, consider a non-contrast Chest CT at 3 months, a PET/CT, or tissue  sampling. These guidelines do not apply to immunocompromised patients and patients with cancer. Follow up in patients with significant comorbidities as clinically warranted. For lung cancer screening, adhere to Lung-RADS guidelines. Reference: Radiology. 2017; 284(1):228-43. 3. Reflux versus retained contrast in a patulous proximal esophagus with mild distal esophageal wall thickening, correlate for symptoms of esophagitis/GERD and consider endoscopy on an outpatient basis. 4. Arterially enhancing bilobar hepatic lesions although incompletely characterized on this study are most consistent with benign hepatic hemangiomata. Consider more definitive characterization by nonemergent hepatic protocol MRI with and without contrast material. 5.  Aortic Atherosclerosis (ICD10-I70.0). Electronically Signed   By: Dahlia Bailiff M.D.   On: 09/25/2021 11:29   DG Chest 2 View  Result Date: 09/25/2021 CLINICAL DATA:  Chest pain and left arm pain. EXAM: CHEST - 2 VIEW COMPARISON:  Chest and abdominal frontal radiographs 08/18/2021, chest two views 03/12/2021 FINDINGS: Cardiac silhouette and mediastinal contours are within normal limits. The lungs are clear. No pleural effusion or pneumothorax. No acute skeletal abnormality. IMPRESSION: No active cardiopulmonary disease. Electronically Signed   By: Yvonne Kendall M.D.   On: 09/25/2021 10:17    ECHO no prior available for review  TELEMETRY reviewed by me: Sinus rhythm rate 60s  EKG reviewed by me: Sinus rhythm rate 81  ASSESSMENT AND PLAN:  Leonard Johnston is a 8yoM with a PMH of poorly controlled type 1 diabetes (A1C 11%), PAD s/p left femoral artery stent x 2 07/2021, hypertension, GERD, former tobacco use, who presented to [facility] ED 09/25/21 with chest pain. Cardiology is consulted for further assistance.   #Chest pain #Type 1 diabetes #PAD s/p left SFA stent x2 07/2021 The patient presents with sharp left-sided chest pain that radiated to his left shoulder and under his  left shoulder blade that started after he ate breakfast this morning and was refractory to nitroglycerin and somewhat relieved with Dilaudid.  His troponins are negative on 2 repeats at 5-4, initial EKG shows sinus rhythm without T wave abnormalities, repeat this afternoon shows some T wave flattening in V2 and V3 without the classic diffuse ST elevations and PR depressions seen with pericarditis, although he does have a friction rub present on exam to auscultation. CTA chest abdomen pelvis is negative for abdominal aneurysm or dissection but did show retained contrast in the proximal esophagus for which clinical correlation for esophagitis vs GERD recommended.  He does have significant risk factors for heart disease including type 1 diabetes and peripheral vascular disease s/p recent stenting to his left SFA in June 2023, and chest discomfort refractory to nitroglycerin and somewhat relieved with Dilaudid today, so further evaluation with an LHC is recommended. -S/p 325 mg aspirin, continue aspirin 81 mg daily indefinitely in addition to clopidogrel 75 mg daily with his recent SFA stents as per vascular  surgery -Defer heparin drip -Agree with high intensity atorvastatin 40 mg nightly.   -Lipid panel 05/2021: TC 278, trigs 317, HDL 39, LDL 175 -Agree with lisinopril 10 mg and initiating low-dose metoprolol -echocardiogram complete  -As needed nitroglycerin, pain control -Based on the patient's multiple risk factors and refractory chest discomfort, plan for left heart cath tomorrow afternoon with Dr. Serafina Royals.  Risks, benefits, alternatives discussed with patient he is agreeable to proceed. N.p.o. after midnight.   This patient's plan of care was discussed and created with Dr. Nehemiah Massed and he is in agreement.  Signed: Tristan Schroeder , PA-C 09/25/2021, 1:34 PM [facility]  The patient has had severe chest pain with no evidence of significant EKG changes although this appears to  be unstable to angina.  I have personally viewed the EKG as well as the chest x-ray.  We have discussed at length further treatment options including heparin as well as further treatment with the possibility of cardiac catheterization.  He understands the risk and benefits of cardiac catheterization.  This includes a possibility of death stroke heart attack infection bleeding or blood clot.  He is at low risk for conscious sedation  The patient has been interviewed and examined. I agree with assessment and plan above. Serafina Royals MD [facility]

## 2021-09-25 NOTE — H&P (Addendum)
History and Physical    Patient: Leonard Johnston XBW:[id number] DOB: 04-02-1965 DOA: 09/25/2021 DOS: the patient was seen and examined on 09/25/2021 PCP: Derinda Late, MD  Patient coming from: Home  Chief Complaint:  Chief Complaint  Patient presents with   Chest Pain   HPI: Leonard Johnston is a 56 y.o. male with medical history significant for insulin dependent diabetes mellitus, disease status post stent angioplasty to his left lower extremity, dyslipidemia, GERD who presents to the ER for evaluation of chest pain that started approximately about 7 AM after he had breakfast. Patient states that he had left sided chest pain which he rated a 10 x 10 in intensity at its worst with radiation to his left shoulder and back associated with difficulty swallowing, shortness of breath, nausea and diaphoresis.  States that he has never had pain like this before.  He was brought into the ER by his roommate and states that he received sublingual nitroglycerin and IV Dilaudid with some improvement in his pain. He continues to have intermittent episodes of pain but at the time of my evaluation he is pain-free. He denies having any cough, no fever, no chills, no abdominal pain, no dizziness, no lightheadedness, no headache, no changes in his bowel habits, no blurred vision no focal deficit   Review of Systems: As mentioned in the history of present illness. All other systems reviewed and are negative. Past Medical History:  Diagnosis Date   Anxiety    Diabetes mellitus type I ([place])    Diabetes mellitus without complication ([place])    type 1   GERD (gastroesophageal reflux disease)    Hypercholesteremia    Past Surgical History:  Procedure Laterality Date   amputaion of toe     COLONOSCOPY WITH PROPOFOL N/A 06/24/2017   Procedure: COLONOSCOPY WITH PROPOFOL;  Surgeon: Manya Silvas, MD;  Location: [facility] ENDOSCOPY;  Service: Endoscopy;  Laterality: N/A;   ESOPHAGOGASTRODUODENOSCOPY (EGD) WITH PROPOFOL  N/A 06/24/2017   Procedure: ESOPHAGOGASTRODUODENOSCOPY (EGD) WITH PROPOFOL;  Surgeon: Manya Silvas, MD;  Location: [facility] ENDOSCOPY;  Service: Endoscopy;  Laterality: N/A;   EYE SURGERY     FEMORAL ARTERY STENT Left    LOWER EXTREMITY ANGIOGRAPHY Left 08/25/2021   Procedure: Lower Extremity Angiography;  Surgeon: Algernon Huxley, MD;  Location: [facility];  Service: Cardiovascular;  Laterality: Left;   TOE SURGERY     Social History:  reports that he quit smoking about 12 years ago. His smoking use included cigarettes. He has never used smokeless tobacco. He reports that he does not drink alcohol and does not use drugs.  Allergies  Allergen Reactions   Codeine Rash    Family History  Problem Relation Age of Onset   Alcohol abuse Sister    Drug abuse Sister    Alcohol abuse Sister    Drug abuse Sister     Prior to Admission medications   Medication Sig Start Date End Date Taking? Authorizing Provider  atorvastatin (LIPITOR) 40 MG tablet Take 40 mg by mouth daily. 09/23/20  Yes [provider]  clopidogrel (PLAVIX) 75 MG tablet Take 1 tablet (75 mg total) by mouth daily. 08/26/21  Yes Fritzi Mandes, MD  lisinopril (ZESTRIL) 10 MG tablet Take 1 tablet (10 mg total) by mouth daily. 08/26/21  Yes Fritzi Mandes, MD  Multiple Vitamin (MULTI-VITAMINS) TABS Take by mouth.   Yes [provider]  aspirin EC 81 MG tablet Take 1 tablet (81 mg total) by mouth daily.  Swallow whole. Patient not taking: Reported on 09/25/2021 08/26/21   Fritzi Mandes, MD  blood glucose meter kit and supplies KIT Dispense based on patient and insurance preference. Use three times a day 08/19/21   Loletha Grayer, MD  famotidine (PEPCID) 40 MG tablet Take 1 tablet by mouth daily.    [provider]  insulin aspart (NOVOLOG) 100 UNIT/ML FlexPen Inject 0-13 Units into the skin 3 (three) times daily with meals. Per sliding scale on smart phone. Patient not taking: Reported on 08/18/2021    Judi Cong, MD  Insulin Glargine [facility]) 100 UNIT/ML Inject 30 Units into the skin at bedtime. Patient taking differently: Inject 28 Units into the skin at bedtime. 09/30/20 12/29/20  Enzo Bi, MD  mupirocin ointment (BACTROBAN) 2 % Apply 1 application topically daily. Patient not taking: Reported on 09/25/2021 09/30/20   Enzo Bi, MD  sildenafil (VIAGRA) 100 MG tablet Take 100 mg by mouth daily as needed. 07/27/21   [provider]  prazosin (MINIPRESS) 5 MG capsule Take 1 capsule (5 mg total) by mouth at bedtime. For nightmares 01/22/18 10/18/19  Ursula Alert, MD  propranolol (INDERAL) 10 MG tablet Take 1 tablet (10 mg total) by mouth 3 (three) times daily as needed. Only for severe agitation/anxiety 01/22/18 10/18/19  Ursula Alert, MD  ranitidine (ZANTAC) 75 MG tablet Take 75 mg by mouth 2 (two) times daily.  10/18/19  [provider]  sertraline (ZOLOFT) 50 MG tablet Take 1.5 tablets (75 mg total) by mouth daily. 01/22/18 10/18/19  Ursula Alert, MD  traZODone (DESYREL) 50 MG tablet TAKE 1/2 TO 1 (ONE-HALF TO ONE) TABLET BY MOUTH AT BEDTIME AS NEEDED FOR SLEEP 01/22/18 10/18/19  Ursula Alert, MD    Physical Exam: Vitals:   09/25/21 0951 09/25/21 0952 09/25/21 1030 09/25/21 1230  BP: (!) 166/100  (!) 140/94 138/87  Pulse: 85  80 63  Resp: _0 Temp: 98 F (36.7 C)     TempSrc: Oral     SpO2: 98%     Weight:  82.1 kg    Height:  _1  (2.057 m)     Physical Exam Vitals and nursing note reviewed.  Constitutional:      Appearance: He is well-developed.  Eyes:     Pupils: Pupils are equal, round, and reactive to light.  Cardiovascular:     Rate and Rhythm: Normal rate and regular rhythm.     Heart sounds: Normal heart sounds.  Pulmonary:     Effort: Pulmonary effort is normal.  Abdominal:     General: Bowel sounds are normal.     Palpations: Abdomen is soft.  Musculoskeletal:        General: Normal range of motion.     Cervical back: Normal  range of motion and neck supple.  Skin:    General: Skin is warm and dry.  Neurological:     General: No focal deficit present.     Mental Status: He is alert.  Psychiatric:        Mood and Affect: Mood normal.        Behavior: Behavior normal.     Data Reviewed: Relevant notes from primary care and specialist visits, past discharge summaries as available in EHR, including Care Everywhere. Prior diagnostic testing as pertinent to current admission diagnoses Updated medications and problem lists for reconciliation ED course, including vitals, labs, imaging, treatment and response to treatment Triage notes, nursing and pharmacy notes and ED provider's  notes Notable results as noted in HPI Labs reviewed.  Sodium 142, potassium 4.3, chloride 108, bicarb 26, glucose 146, BUN 38, creatinine 1.37, calcium 8.9, white count 4.2, hemoglobin 13.6, hematocrit 41.9, platelet count 136, total protein 6.9, albumin 3.9, AST 23, ALT 16, alkaline phosphatase 78, total bilirubin 0.8, direct bilirubin 0.1, troponin 5 >> 4, lipase 27 Chest x-ray reviewed by me shows no evidence of active cardiopulmonary disease CT angiogram of the chest/abdomen/pelvis shows No aortic aneurysm, dissection or intramural hematoma.Spiculated 10 mm suspicious right solid pulmonary nodule within the upper lobe. Per Fleischner Society Guidelines, consider a non-contrast Chest CT at 3 months, a PET/CT, or tissue sampling.Reflux versus retained contrast in a patulous proximal esophagus with mild distal esophageal wall thickening, correlate for symptoms of esophagitis/GERD and consider endoscopy on an outpatient basis.Arterially enhancing bilobar hepatic lesions although incompletely characterized on this study are most consistent with benign hepatic hemangiomata. Consider more definitive characterization by nonemergent hepatic protocol MRI with and without contrast material. Aortic Atherosclerosis  Twelve-lead EKG reviewed by me shows  normal sinus rhythm with no ST or T wave changes  There are no new results to review at this time.  Assessment and Plan: * Chest pain Patient with a history of diabetes mellitus and PAD who presents to the ER for evaluation of sudden onset left sided chest pain with radiation to the left arm and shoulder after a meal associated with difficulty swallowing, nausea, shortness of breath and diaphoresis. Patient's pain improved following administration of sublingual nitroglycerin as well as IV Dilaudid Patient referred to observation status for further evaluation due to his risk factors Obtain 2D echocardiogram to assess LVEF and rule out regional wall motion abnormality. Continue aspirin, Plavix, atorvastatin and low-dose beta-blockers Consult cardiology  PAD (peripheral artery disease) ([place]) Status post left lower extremity angioplasty with stent placement for nonhealing diabetic foot ulcer (06/23) Continue aspirin, Plavix, atorvastatin   GERD (gastroesophageal reflux disease) Patient with a known history of reflux who presents for evaluation of chest pain. 2 sets of cardiac enzymes are negative and imaging shows reflux versus retained contrast in a patulous proximal esophagus with mild distal esophageal wall thickening, correlate for symptoms of esophagitis/GERD and consider endoscopy on an outpatient basis. Chest pain may be related to reflux disease but will need to rule out a cardiac cause of chest pain due to patient's risk factors which include diabetes mellitus and PAD We will place patient on IV Protonix Will need referral to GI as an outpatient for further evaluation.  Essential hypertension Stable Continue lisinopril  Diabetes mellitus with retinopathy ([place]) Patient has a history of insulin-dependent diabetes mellitus  Continue long-acting insulin with sliding scale coverage Maintain consistent carbohydrate Check blood sugars with meals  Solitary lung nodule Patient noted  to have a spiculated 10 mm suspicious right solid pulmonary nodule within the upper lobe.  History of prior nicotine use Per Fleischner Society Guidelines, consider a non-contrast Chest CT at 3 months, a PET/CT, or tissue sampling. Will need referral to pulmonology as an outpatient for further evaluation      Advance Care Planning:   Code Status: Full Code   Consults: Cardiology  Family Communication: Greater than 50% of time was spent discussing patient's condition and plan of care with him at the bedside.  All questions and concerns have been addressed.  He verbalizes understanding and agrees with the plan.  Severity of Illness: The appropriate patient status for this patient is OBSERVATION. Observation status is judged to be reasonable  and necessary in order to provide the required intensity of service to ensure the patient's safety. The patient's presenting symptoms, physical exam findings, and initial radiographic and laboratory data in the context of their medical condition is felt to place them at decreased risk for further clinical deterioration. Furthermore, it is anticipated that the patient will be medically stable for discharge from the hospital within 2 midnights of admission.   Author: Collier Bullock, MD 09/25/2021 4:13 PM  For on call review [email or web address].

## 2021-09-25 NOTE — ED Triage Notes (Signed)
Chest pain and left arm pain for aobut one hour.  History of DM and htn.  Pt is anxious.

## 2021-09-25 NOTE — ED Provider Notes (Signed)
[facility] Provider Note    Event Date/Time   First MD Initiated Contact with Patient 09/25/21 1000     (approximate)   History   Chest Pain   HPI  Leonard Johnston is a 56 y.o. male history of chronic kidney disease, previous osteomyelitis, retinopathy, reflux diabetes, peripheral artery disease   Reviewed discharge summary from June 23 of this year patient noted to have diabetic foot ulcer, treated with antibiotics also had left lower extremity angiogram with angioplasty  Sudden onset severe abrupt left chest pain rating to his left arm 1 hour prior to arrival.  Started in his middle of his left chest seems like it is moved towards his left shoulder blade and into his back.  His pain is severe.  He is never experienced pain like this before.  No numbness tingling or weakness.  No nausea or vomiting.  Denies shortness of breath  Reports his left leg and left foot have been healing well and doing great since his recent hospitalization  Has not taken sildenafil for several weeks (I asked in anticipation of administering nitroglycerin)  Physical Exam   Triage Vital Signs: ED Triage Vitals  Enc Vitals Group     BP 09/25/21 0951 (!) 166/100     Pulse Rate 09/25/21 0951 85     Resp 09/25/21 0951 19     Temp 09/25/21 0951 98 F (36.7 C)     Temp Source 09/25/21 0951 Oral     SpO2 09/25/21 0951 98 %     Weight 09/25/21 0952 181 lb (82.1 kg)     Height 09/25/21 0952 '6\' 9"'$  (2.057 m)     Head Circumference --      Peak Flow --      Pain Score 09/25/21 0952 10     Pain Loc --      Pain Edu? --      Excl. in [place]? --     Most recent vital signs: Vitals:   09/25/21 0951 09/25/21 1030  BP: (!) 166/100 (!) 140/94  Pulse: 85 80  Resp: 19 14  Temp: 98 F (36.7 C)   SpO2: 98%      General: Awake, appears in severe pain.  Reports a severe pain in his left upper back and left chest rating towards his left arm. CV:  Good peripheral perfusion.  Normal  heart tones.  Warm well-perfused extremities, strong left radial pulse with normal capillary refill all digits of the left hand Resp:  Normal effort.  Clear bilaterally.  No respiratory distress no wheezing. Abd:  No distention.  Soft nontender nondistended denies pain to palpation in any area of the abdomen Other:  Lower extremities warm well perfused   ED Results / Procedures / Treatments   Labs (all labs ordered are listed, but only abnormal results are displayed) Labs Reviewed  BASIC METABOLIC PANEL - Abnormal; Notable for the following components:      Result Value   Glucose, Bld 146 (*)    BUN 38 (*)    Creatinine, Ser 1.37 (*)    All other components within normal limits  CBC - Abnormal; Notable for the following components:   Platelets 136 (*)    All other components within normal limits  PROTIME-INR  APTT  LIPASE, BLOOD  HEPATIC FUNCTION PANEL  TROPONIN I (HIGH SENSITIVITY)  TROPONIN I (HIGH SENSITIVITY)     EKG  Interpreted by me at 9:50 AM heart rate 90 QRS 90 QTc  440 slight baseline artifact.  Normal sinus rhythm, no evidence of acute ischemia or ectopy.   RADIOLOGY  Chest x-ray personally interpreted by me as negative for acute finding  CTA chest abdomen pelvis IMPRESSION: 1. No aortic aneurysm, dissection or intramural hematoma. 2. Spiculated 10 mm suspicious right solid pulmonary nodule within the upper lobe. Per Fleischner Society Guidelines, consider a non-contrast Chest CT at 3 months, a PET/CT, or tissue sampling. These guidelines do not apply to immunocompromised patients and patients with cancer. Follow up in patients with significant comorbidities as clinically warranted. For lung cancer screening, adhere to Lung-RADS guidelines. Reference: Radiology. 2017; 284(1):228-43. 3. Reflux versus retained contrast in a patulous proximal esophagus with mild distal esophageal wall thickening, correlate for symptoms of esophagitis/GERD and consider  endoscopy on an outpatient basis. 4. Arterially enhancing bilobar hepatic lesions although incompletely characterized on this study are most consistent with benign hepatic hemangiomata. Consider more definitive characterization by nonemergent hepatic protocol MRI with and without contrast material. 5. Aortic Atherosclerosis (ICD10-I70.0).      PROCEDURES:  Critical Care performed: No   Procedures   MEDICATIONS ORDERED IN ED: Medications  aspirin EC tablet 81 mg (has no administration in time range)  atorvastatin (LIPITOR) tablet 40 mg (has no administration in time range)  lisinopril (ZESTRIL) tablet 10 mg (has no administration in time range)  Basaglar KwikPen KwikPen 28 Units (has no administration in time range)  famotidine (PEPCID) tablet 40 mg (has no administration in time range)  clopidogrel (PLAVIX) tablet 75 mg (has no administration in time range)  Multi-Vitamins TABS (has no administration in time range)  acetaminophen (TYLENOL) tablet 650 mg (has no administration in time range)  ondansetron (ZOFRAN) injection 4 mg (has no administration in time range)  enoxaparin (LOVENOX) injection 40 mg (has no administration in time range)  HYDROmorphone (DILAUDID) injection 1 mg (1 mg Intravenous Given 09/25/21 1027)  ondansetron (ZOFRAN) injection 4 mg (4 mg Intravenous Given 09/25/21 1027)  aspirin chewable tablet 324 mg (324 mg Oral Given 09/25/21 1027)  nitroGLYCERIN (NITROSTAT) SL tablet 0.4 mg (0.4 mg Sublingual Given 09/25/21 1032)  iohexol (OMNIPAQUE) 350 MG/ML injection 100 mL (100 mLs Intravenous Contrast Given 09/25/21 1044)  HYDROmorphone (DILAUDID) injection 1 mg (1 mg Intravenous Given 09/25/21 1143)     IMPRESSION / MDM / ASSESSMENT AND PLAN / ED COURSE  I reviewed the triage vital signs and the nursing notes.                              Differential diagnosis includes, but is not limited to, ACS, aortic dissection, pulmonary embolism, cardiac tamponade,  pneumothorax, pneumonia, pericarditis, myocarditis, GI-related causes including esophagitis/gastritis, and musculoskeletal chest wall pain.    Based on the patient's presentation and his initial EKG I am highly concerned about etiologies such as aortic pathology dissection, also on the list to be pulmonary embolism, but not quite as high on my differential as say dissection given this presentation, also concern for ACS etc.  We will work him up emergently  Patient's presentation is most consistent with acute presentation with potential threat to life or bodily function.  The patient is on the cardiac monitor to evaluate for evidence of arrhythmia and/or significant heart rate changes.  Clinical Course as of 09/25/21 1309  Mon Sep 25, 2021  1054 Second EKG interpreted by me at 1016 heart rate 80 QRS 90 QTc 450 Normal sinus rhythm no evidence of acute ischemia  or ectopy.  Negative for STEMI [MQ]    Clinical Course User Index [MQ] Delman Kitten, MD   ----------------------------------------- 12:11 PM on 09/25/2021 ----------------------------------------- Patient's pain is controlled now, but still some persistent pain in the left chest.  Improved with nitroglycerin and hydromorphone dosing.  His EKGs do not show evidence of acute ischemia and his first troponin is normal.  However he has multiple risk factors known peripheral artery disease etc.  Based on this and discussion with the patient as well as his multiple risk factors and ongoing discomfort, plan to admit for further care and treatment including chest pain observation and further admission work-up under the hospitalist service.  Patient agreeable with this plan.  He is fully alert and oriented at this time.  Consulted with the hospitalist discussed case with Dr. Francine Graven, Who is acccepting of admission plan  FINAL CLINICAL IMPRESSION(S) / ED DIAGNOSES   Final diagnoses:  Chest pain, moderate coronary artery risk  Lung nodule      Rx / DC Orders   ED Discharge Orders     None        Note:  This document was prepared using Dragon voice recognition software and may include unintentional dictation errors.   Delman Kitten, MD 09/25/21 1309

## 2021-09-25 NOTE — Assessment & Plan Note (Signed)
Patient with a known history of reflux who presents for evaluation of chest pain. 2 sets of cardiac enzymes are negative and imaging shows reflux versus retained contrast in a patulous proximal esophagus with mild distal esophageal wall thickening, correlate for symptoms of esophagitis/GERD and consider endoscopy on an outpatient basis. Chest pain may be related to reflux disease but will need to rule out a cardiac cause of chest pain due to patient's risk factors which include diabetes mellitus and PAD We will place patient on IV Protonix Will need referral to GI as an outpatient for further evaluation.

## 2021-09-25 NOTE — Assessment & Plan Note (Signed)
Stable. Continue lisinopril

## 2021-09-25 NOTE — Assessment & Plan Note (Signed)
Status post left lower extremity angioplasty with stent placement for nonhealing diabetic foot ulcer (06/23) Continue aspirin, Plavix, atorvastatin

## 2021-09-25 NOTE — Assessment & Plan Note (Signed)
Patient noted to have a spiculated 10 mm suspicious right solid pulmonary nodule within the upper lobe.  History of prior nicotine use Per Fleischner Society Guidelines, consider a non-contrast Chest CT at 3 months, a PET/CT, or tissue sampling. Will need referral to pulmonology as an outpatient for further evaluation

## 2021-09-25 NOTE — ED Notes (Signed)
RN bed assigned

## 2021-09-25 NOTE — Progress Notes (Signed)
       CROSS COVER NOTE  NAME: Leonard Johnston MRN: [id number] DOB : 1965-04-04    Date of Service   09/25/21  HPI/Events of Note   Recurrent 10/10 chest pain unrelieved by Morphine Due for cath tomorrow afternoon.  Pt reports Morphine improves 10/10 pain to 8 and dilaudid decreases to 5-6/10. Did not feel a change with Nitro  Interventions   Plan: Dilaudid 1 mg IV  Repeat EKG PRN SL Nitroglycerin ordered PRN Morphine changed to PRN Dilaudid    This document was prepared using Dragon voice recognition software and may include unintentional dictation errors.  Neomia Glass DNP, MHA, FNP-BC Nurse Practitioner [facility] Pager [phone]

## 2021-09-25 NOTE — Assessment & Plan Note (Addendum)
Patient has a history of insulin-dependent diabetes mellitus  Continue long-acting insulin with sliding scale coverage Maintain consistent carbohydrate Check blood sugars with meals

## 2021-09-25 NOTE — Assessment & Plan Note (Signed)
Patient with a history of diabetes mellitus and PAD who presents to the ER for evaluation of sudden onset left sided chest pain with radiation to the left arm and shoulder after a meal associated with difficulty swallowing, nausea, shortness of breath and diaphoresis. Patient's pain improved following administration of sublingual nitroglycerin as well as IV Dilaudid Patient referred to observation status for further evaluation due to his risk factors Obtain 2D echocardiogram to assess LVEF and rule out regional wall motion abnormality. Continue aspirin, Plavix, atorvastatin and low-dose beta-blockers Consult cardiology

## 2021-09-26 ENCOUNTER — Encounter
Admission: EM | Disposition: A | Discharge status: Home / Self Care | Payer: Self-pay | Source: Home / Self Care | Attending: Internal Medicine

## 2021-09-26 DIAGNOSIS — Z7902 Long term (current) use of antithrombotics/antiplatelets: Secondary | ICD-10-CM | POA: Diagnosis not present

## 2021-09-26 DIAGNOSIS — R079 Chest pain, unspecified: Secondary | ICD-10-CM | POA: Diagnosis not present

## 2021-09-26 DIAGNOSIS — Z9862 Peripheral vascular angioplasty status: Secondary | ICD-10-CM | POA: Diagnosis not present

## 2021-09-26 DIAGNOSIS — E1051 Type 1 diabetes mellitus with diabetic peripheral angiopathy without gangrene: Secondary | ICD-10-CM | POA: Diagnosis present

## 2021-09-26 DIAGNOSIS — R911 Solitary pulmonary nodule: Secondary | ICD-10-CM | POA: Diagnosis present

## 2021-09-26 DIAGNOSIS — Z811 Family history of alcohol abuse and dependence: Secondary | ICD-10-CM | POA: Diagnosis not present

## 2021-09-26 DIAGNOSIS — Z87891 Personal history of nicotine dependence: Secondary | ICD-10-CM | POA: Diagnosis not present

## 2021-09-26 DIAGNOSIS — Z813 Family history of other psychoactive substance abuse and dependence: Secondary | ICD-10-CM | POA: Diagnosis not present

## 2021-09-26 DIAGNOSIS — I200 Unstable angina: Secondary | ICD-10-CM

## 2021-09-26 DIAGNOSIS — E7800 Pure hypercholesterolemia, unspecified: Secondary | ICD-10-CM | POA: Diagnosis present

## 2021-09-26 DIAGNOSIS — I25110 Atherosclerotic heart disease of native coronary artery with unstable angina pectoris: Secondary | ICD-10-CM

## 2021-09-26 DIAGNOSIS — E10319 Type 1 diabetes mellitus with unspecified diabetic retinopathy without macular edema: Secondary | ICD-10-CM | POA: Diagnosis present

## 2021-09-26 DIAGNOSIS — Z79899 Other long term (current) drug therapy: Secondary | ICD-10-CM | POA: Diagnosis not present

## 2021-09-26 DIAGNOSIS — Z794 Long term (current) use of insulin: Secondary | ICD-10-CM | POA: Diagnosis not present

## 2021-09-26 DIAGNOSIS — D696 Thrombocytopenia, unspecified: Secondary | ICD-10-CM | POA: Diagnosis present

## 2021-09-26 DIAGNOSIS — K219 Gastro-esophageal reflux disease without esophagitis: Secondary | ICD-10-CM | POA: Diagnosis present

## 2021-09-26 DIAGNOSIS — Z885 Allergy status to narcotic agent status: Secondary | ICD-10-CM | POA: Diagnosis not present

## 2021-09-26 DIAGNOSIS — I739 Peripheral vascular disease, unspecified: Secondary | ICD-10-CM | POA: Diagnosis not present

## 2021-09-26 DIAGNOSIS — E1065 Type 1 diabetes mellitus with hyperglycemia: Secondary | ICD-10-CM | POA: Diagnosis present

## 2021-09-26 DIAGNOSIS — I10 Essential (primary) hypertension: Secondary | ICD-10-CM | POA: Diagnosis present

## 2021-09-26 DIAGNOSIS — Z7982 Long term (current) use of aspirin: Secondary | ICD-10-CM | POA: Diagnosis not present

## 2021-09-26 HISTORY — PX: INTRAVASCULAR ULTRASOUND/IVUS: CATH118244

## 2021-09-26 HISTORY — PX: CORONARY STENT INTERVENTION: CATH118234

## 2021-09-26 HISTORY — PX: LEFT HEART CATH AND CORONARY ANGIOGRAPHY: CATH118249

## 2021-09-26 LAB — POCT ACTIVATED CLOTTING TIME
Activated Clotting Time: 275 s
Activated Clotting Time: 311 s

## 2021-09-26 LAB — C-REACTIVE PROTEIN: CRP: 0.6 mg/dL (ref <1.0)

## 2021-09-26 LAB — GLUCOSE, CAPILLARY
Glucose-Capillary: 220 mg/dL — ABNORMAL HIGH (ref 70–99)
Glucose-Capillary: 154 mg/dL — ABNORMAL HIGH (ref 70–99)
Glucose-Capillary: 58 mg/dL — ABNORMAL LOW (ref 70–99)
Glucose-Capillary: 109 mg/dL — ABNORMAL HIGH (ref 70–99)
Glucose-Capillary: 68 mg/dL — ABNORMAL LOW (ref 70–99)
Glucose-Capillary: 72 mg/dL (ref 70–99)
Glucose-Capillary: 307 mg/dL — ABNORMAL HIGH (ref 70–99)

## 2021-09-26 LAB — CBC
HCT: 40.5 % (ref 39.0–52.0)
Hemoglobin: 13.2 g/dL (ref 13.0–17.0)
MCH: 30.9 pg (ref 26.0–34.0)
MCHC: 32.6 g/dL (ref 30.0–36.0)
MCV: 94.8 fL (ref 80.0–100.0)
Platelets: 96 10*3/uL — ABNORMAL LOW (ref 150–400)
RBC: 4.27 MIL/uL (ref 4.22–5.81)
RDW: 13.9 % (ref 11.5–15.5)
WBC: 2.6 10*3/uL — ABNORMAL LOW (ref 4.0–10.5)
nRBC: 0 % (ref 0.0–0.2)

## 2021-09-26 LAB — LIPID PANEL
Cholesterol: 157 mg/dL (ref 0–200)
HDL: 43 mg/dL (ref >40)
LDL Cholesterol: 84 mg/dL (ref 0–99)
Total CHOL/HDL Ratio: 3.7 RATIO
Triglycerides: 151 mg/dL — ABNORMAL HIGH (ref <150)
VLDL: 30 mg/dL (ref 0–40)

## 2021-09-26 LAB — BASIC METABOLIC PANEL
Anion gap: 4 — ABNORMAL LOW (ref 5–15)
BUN: 26 mg/dL — ABNORMAL HIGH (ref 6–20)
CO2: 28 mmol/L (ref 22–32)
Calcium: 8.5 mg/dL — ABNORMAL LOW (ref 8.9–10.3)
Chloride: 107 mmol/L (ref 98–111)
Creatinine, Ser: 1.1 mg/dL (ref 0.61–1.24)
GFR, Estimated: >60 mL/min (ref >60)
Glucose, Bld: 253 mg/dL — ABNORMAL HIGH (ref 70–99)
Potassium: 4.6 mmol/L (ref 3.5–5.1)
Sodium: 139 mmol/L (ref 135–145)

## 2021-09-26 LAB — SEDIMENTATION RATE: Sed Rate: 6 mm/hr (ref 0–20)

## 2021-09-26 SURGERY — LEFT HEART CATH AND CORONARY ANGIOGRAPHY
Anesthesia: Moderate Sedation

## 2021-09-26 MED ORDER — SODIUM CHLORIDE 0.9% FLUSH: 3.0000 mL | INTRAVENOUS | Status: DC | PRN

## 2021-09-26 MED ORDER — SODIUM CHLORIDE 0.9 % IV SOLN
8.0000 mg | Freq: Four times a day (QID) | INTRAVENOUS | Status: DC | PRN
  Administered 2021-09-26: 8 mg via INTRAVENOUS
  Filled 2021-09-26 (×2): qty 4

## 2021-09-26 MED ORDER — IOHEXOL 300 MG/ML  SOLN
INTRAMUSCULAR | Status: DC | PRN
  Administered 2021-09-26: 83 mL

## 2021-09-26 MED ORDER — SODIUM CHLORIDE 0.9 % IV SOLN
250.0000 mL | INTRAVENOUS | Status: DC | PRN
  Administered 2021-09-26: 250 mL via INTRAVENOUS

## 2021-09-26 MED ORDER — LABETALOL HCL 5 MG/ML IV SOLN: 10.0000 mg | INTRAVENOUS | Status: AC | PRN

## 2021-09-26 MED ORDER — SODIUM CHLORIDE 0.9 % IV SOLN: INTRAVENOUS | Status: DC

## 2021-09-26 MED ORDER — HEPARIN SODIUM (PORCINE) 1000 UNIT/ML IJ SOLN
INTRAMUSCULAR | Status: DC | PRN
  Administered 2021-09-26: 5000 [IU] via INTRAVENOUS

## 2021-09-26 MED ORDER — LIDOCAINE HCL 1 % IJ SOLN
INTRAMUSCULAR | Status: AC
  Filled 2021-09-26: qty 20

## 2021-09-26 MED ORDER — HEPARIN SODIUM (PORCINE) 1000 UNIT/ML IJ SOLN
INTRAMUSCULAR | Status: DC | PRN
  Administered 2021-09-26: 4000 [IU] via INTRAVENOUS

## 2021-09-26 MED ORDER — HEPARIN SODIUM (PORCINE) 1000 UNIT/ML IJ SOLN
INTRAMUSCULAR | Status: AC
  Filled 2021-09-26: qty 10

## 2021-09-26 MED ORDER — HEPARIN SODIUM (PORCINE) 1000 UNIT/ML IJ SOLN
INTRAMUSCULAR | Status: DC | PRN
  Administered 2021-09-26: 2000 [IU] via INTRAVENOUS

## 2021-09-26 MED ORDER — CLOPIDOGREL BISULFATE 75 MG PO TABS
ORAL_TABLET | ORAL | Status: AC
  Filled 2021-09-26: qty 4

## 2021-09-26 MED ORDER — SODIUM CHLORIDE 0.9 % IV SOLN: 250.0000 mL | INTRAVENOUS | Status: DC | PRN

## 2021-09-26 MED ORDER — HEPARIN (PORCINE) IN NACL 1000-0.9 UT/500ML-% IV SOLN
INTRAVENOUS | Status: DC | PRN
  Administered 2021-09-26 (×2): 500 mL

## 2021-09-26 MED ORDER — CLOPIDOGREL BISULFATE 75 MG PO TABS
ORAL_TABLET | ORAL | Status: DC | PRN
  Administered 2021-09-26: 300 mg via ORAL

## 2021-09-26 MED ORDER — FENTANYL CITRATE (PF) 100 MCG/2ML IJ SOLN
INTRAMUSCULAR | Status: DC | PRN
  Administered 2021-09-26: 25 ug via INTRAVENOUS

## 2021-09-26 MED ORDER — MIDAZOLAM HCL 2 MG/2ML IJ SOLN
INTRAMUSCULAR | Status: DC | PRN
  Administered 2021-09-26: 1 mg via INTRAVENOUS

## 2021-09-26 MED ORDER — MIDAZOLAM HCL 2 MG/2ML IJ SOLN
INTRAMUSCULAR | Status: AC
  Filled 2021-09-26: qty 2

## 2021-09-26 MED ORDER — SODIUM CHLORIDE 0.9 % IV SOLN: INTRAVENOUS | Status: AC

## 2021-09-26 MED ORDER — FENTANYL CITRATE (PF) 100 MCG/2ML IJ SOLN
INTRAMUSCULAR | Status: AC
  Filled 2021-09-26: qty 2

## 2021-09-26 MED ORDER — FENTANYL CITRATE (PF) 100 MCG/2ML IJ SOLN
INTRAMUSCULAR | Status: DC | PRN
  Administered 2021-09-26 (×2): 25 ug via INTRAVENOUS

## 2021-09-26 MED ORDER — SODIUM CHLORIDE 0.9 % IV SOLN
INTRAVENOUS | Status: DC | PRN
  Administered 2021-09-26: 250 mL via INTRAVENOUS

## 2021-09-26 MED ORDER — SODIUM CHLORIDE 0.9 % IV SOLN
12.5000 mg | Freq: Four times a day (QID) | INTRAVENOUS | Status: DC | PRN
  Administered 2021-09-26 (×2): 12.5 mg via INTRAVENOUS
  Filled 2021-09-26: qty 12.5
  Filled 2021-09-26 (×2): qty 0.5

## 2021-09-26 MED ORDER — HYDRALAZINE HCL 20 MG/ML IJ SOLN: 10.0000 mg | INTRAMUSCULAR | Status: AC | PRN

## 2021-09-26 MED ORDER — LIDOCAINE HCL (PF) 1 % IJ SOLN
INTRAMUSCULAR | Status: DC | PRN
  Administered 2021-09-26: 2 mL

## 2021-09-26 MED ORDER — SODIUM CHLORIDE 0.9% FLUSH
3.0000 mL | Freq: Two times a day (BID) | INTRAVENOUS | Status: DC
Start: 2021-09-26 — End: 2021-09-27
  Administered 2021-09-26 – 2021-09-27 (×2): 3 mL via INTRAVENOUS

## 2021-09-26 MED ORDER — VERAPAMIL HCL 2.5 MG/ML IV SOLN
INTRAVENOUS | Status: DC | PRN
  Administered 2021-09-26: 2.5 mg via INTRA_ARTERIAL

## 2021-09-26 MED ORDER — VERAPAMIL HCL 2.5 MG/ML IV SOLN
INTRAVENOUS | Status: AC
  Filled 2021-09-26: qty 2

## 2021-09-26 MED ORDER — HYDROMORPHONE HCL 1 MG/ML IJ SOLN
INTRAMUSCULAR | Status: AC
  Administered 2021-09-26: 1 mg via INTRAVENOUS
  Filled 2021-09-26: qty 1

## 2021-09-26 MED ORDER — ENOXAPARIN SODIUM 40 MG/0.4ML IJ SOSY
40.0000 mg | PREFILLED_SYRINGE | INTRAMUSCULAR | Status: DC
  Administered 2021-09-27: 40 mg via SUBCUTANEOUS
  Filled 2021-09-26 (×3): qty 0.4

## 2021-09-26 MED ORDER — IOHEXOL 300 MG/ML  SOLN
INTRAMUSCULAR | Status: DC | PRN
  Administered 2021-09-26: 70 mL

## 2021-09-26 MED ORDER — NITROGLYCERIN 1 MG/10 ML FOR IR/CATH LAB
INTRA_ARTERIAL | Status: DC | PRN
  Administered 2021-09-26: 200 ug via INTRACORONARY

## 2021-09-26 MED ORDER — DEXTROSE 50 % IV SOLN
12.5000 g | INTRAVENOUS | Status: AC
  Administered 2021-09-26: 12.5 g via INTRAVENOUS

## 2021-09-26 SURGICAL SUPPLY — 19 items
BALLN TREK RX 2.5X12 (BALLOONS) ×2
BALLN ~~LOC~~ TREK NEO RX 3.0X12 (BALLOONS) ×2
BALLOON TREK RX 2.5X12 (BALLOONS) IMPLANT
BALLOON ~~LOC~~ TREK NEO RX 3.0X12 (BALLOONS) IMPLANT
BAND ZEPHYR COMPRESS 30 LONG (HEMOSTASIS) ×1 IMPLANT
CATH 5FR JL3.5 JR4 ANG PIG MP (CATHETERS) ×1 IMPLANT
CATH EAGLE EYE PLAT IMAGING (CATHETERS) ×1 IMPLANT
CATH LAUNCHER 6FR EBU 3.75 (CATHETERS) ×1 IMPLANT
DRAPE BRACHIAL (DRAPES) ×1 IMPLANT
GLIDESHEATH SLEND SS 6F .021 (SHEATH) ×1 IMPLANT
GUIDEWIRE INQWIRE 1.5J.035X260 (WIRE) IMPLANT
INQWIRE 1.5J .035X260CM (WIRE) ×2
KIT ENCORE 26 ADVANTAGE (KITS) ×1 IMPLANT
PACK CARDIAC CATH (CUSTOM PROCEDURE TRAY) ×2 IMPLANT
PROTECTION STATION PRESSURIZED (MISCELLANEOUS) ×2
SET ATX SIMPLICITY (MISCELLANEOUS) ×1 IMPLANT
STATION PROTECTION PRESSURIZED (MISCELLANEOUS) IMPLANT
STENT ONYX FRONTIER 2.75X18 (Permanent Stent) ×1 IMPLANT
WIRE RUNTHROUGH .014X180CM (WIRE) ×1 IMPLANT

## 2021-09-26 NOTE — Progress Notes (Signed)
PT is having severe, stabbing left sided chest pain that is coming in waves. PT placed on oxygen via nasal cannula per Dr. Nehemiah Massed verbal order. New EKG obtained and evaluated by Dr. Nehemiah Massed who was at bedside. No new orders.  Will give dilaudid as ordered once pharmacy is able to have anti-nausea medication ready to be given in conjunction as pt states he cannot have pain medication without anti-nausea as well.

## 2021-09-26 NOTE — Progress Notes (Signed)
PROGRESS NOTE   HPI was taken from Dr. Francine Graven: Leonard Johnston is a 56 y.o. male with medical history significant for insulin dependent diabetes mellitus, disease status post stent angioplasty to his left lower extremity, dyslipidemia, GERD who presents to the ER for evaluation of chest pain that started approximately about 7 AM after he had breakfast. Patient states that he had left sided chest pain which he rated a 10 x 10 in intensity at its worst with radiation to his left shoulder and back associated with difficulty swallowing, shortness of breath, nausea and diaphoresis.  States that he has never had pain like this before.  He was brought into the ER by his roommate and states that he received sublingual nitroglycerin and IV Dilaudid with some improvement in his pain. He continues to have intermittent episodes of pain but at the time of my evaluation he is pain-free. 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 Troponins are nge x 2. Hx of DM2 & PAD. Echo ordered. Cardiac cath today as per cardio. Continue on tele. Continue on aspirin, plavix, metoprolol, lisinopril & statin. Cardio following and recs apprec    PAD: s/p left lower extremity angioplasty with stent placement for nonhealing diabetic foot ulcer (06/23). Continue on plavix, aspirin, statin    GERD: CT shows reflux versus retained contrast in a patulous proximal esophagus with mild distal esophageal wall  thickening, correlate for symptoms of esophagitis/GERD and consider endoscopy on an outpatient basis. Continue on PPI    HTN: continue on metoprolol, lisinopril    DM2: likely poorly controlled. Continue on glargine, SSI w/ accuchecks   Solitary lung nodule: noted to have a spiculated 10 mm suspicious right solid pulmonary nodule within. Needs f/u w/ PCP or pulmonary for CT chest in 3 months outpatient, PET scan or biopsy  Thrombocytopenia: etiology unclear. Will continue to monitor     DVT prophylaxis: lovenox Code Status: full  Family Communication: discussed pt's care w/ pt's family at bedside and answered their questions  Disposition Plan: likely d/c back home  Level of care: Telemetry Cardiac  Status is: Inpatient Remains inpatient appropriate because: cardiac cath today      Consultants:  Cardio   Procedures:   Antimicrobials:    Subjective: Pt c/o nausea & vomiting  Objective: Vitals:   09/26/21 0008 09/26/21 0321 09/26/21 0502 09/26/21 0730  BP: 99/66 125/84 (!) 93/56 121/71  Pulse: 72 65 74 82  Resp: '18 18 18 18  '$ Temp: 97.6 F (36.4 C)  97.6 F (36.4 C) 98.3 F (36.8 C)  TempSrc:      SpO2: 100% 100% 100% 100%  Weight:      Height:       No intake or output data in the 24 hours ending 09/26/21 0826 Filed Weights   09/25/21 0952  Weight: 82.1 kg    Examination:  General exam: Appears calm and comfortable  Respiratory system: Clear to auscultation. Respiratory effort normal. Cardiovascular system:  S1 & S2 +. No rubs, gallops or clicks. Gastrointestinal system: Abdomen is nondistended, soft and nontender. Normal bowel sounds heard. Central nervous system: Alert and oriented. Moves all extremities  Psychiatry: Judgement and insight appear normal. Flat mood and affect     Data Reviewed: I have personally reviewed following labs and imaging studies  CBC: Recent Labs  Lab 09/25/21 0953 09/26/21 0345  WBC 4.2 2.6*  HGB 13.6 13.2  HCT 41.9  40.5  MCV 94.8 94.8  PLT 136* 96*   Basic Metabolic Panel: Recent Labs  Lab 09/25/21 0953 09/26/21 0345  NA 142 139  K 4.3 4.6  CL 108 107  CO2 26 28  GLUCOSE 146* 253*  BUN 38* 26*  CREATININE 1.37* 1.10  CALCIUM 8.9 8.5*   GFR: Estimated Creatinine Clearance: 88.1 mL/min (by C-G formula based on SCr of 1.1 mg/dL). Liver Function Tests: Recent Labs  Lab 09/25/21 0953  AST 23  ALT 16  ALKPHOS 78  BILITOT 0.8  PROT 6.9  ALBUMIN 3.9   Recent Labs  Lab 09/25/21 0953  LIPASE 27   No results for input(s): "AMMONIA" in the last 168 hours. Coagulation Profile: Recent Labs  Lab 09/25/21 0953  INR 1.0   Cardiac Enzymes: No results for input(s): "CKTOTAL", "CKMB", "CKMBINDEX", "TROPONINI" in the last 168 hours. BNP (last 3 results) No results for input(s): "PROBNP" in the last 8760 hours. HbA1C: No results for input(s): "HGBA1C" in the last 72 hours. CBG: Recent Labs  Lab 09/25/21 1515 09/25/21 1746 09/25/21 2108 09/26/21 0732  GLUCAP 194* 255* 201* 220*   Lipid Profile: Recent Labs    09/26/21 0345  CHOL 157  HDL 43  LDLCALC 84  TRIG 151*  CHOLHDL 3.7   Thyroid Function Tests: No results for input(s): "TSH", "T4TOTAL", "FREET4", "T3FREE", "THYROIDAB" in the last 72 hours. Anemia Panel: No results for input(s): "VITAMINB12", "FOLATE", "FERRITIN", "TIBC", "IRON", "RETICCTPCT" in the last 72 hours. Sepsis Labs: No results for input(s): "PROCALCITON", "LATICACIDVEN" in the last 168 hours.  No results found for this or any previous visit (from the past 240 hour(s)).       Radiology Studies: ECHOCARDIOGRAM COMPLETE  Result Date: 09/25/2021    ECHOCARDIOGRAM REPORT   Patient Name:   Leonard Johnston Date of Exam: 09/25/2021 Medical Rec #:  [id number]    Height:       81.0 in Accession #:    [id number]   Weight:       181.0 lb Date of Birth:  [birth year]    BSA:          2.224 m Patient Age:    70 years     BP:           140/94 mmHg Patient Gender: M             HR:           67 bpm. Exam Location:  [facility] Procedure: 2D Echo, Cardiac Doppler and Color Doppler Indications:     R07.9 Chest pain  History:         Patient has no prior history of Echocardiogram examinations.                  Left femoral artery stent, PAD; Risk Factors:Former Smoker,                  Hypertension, Dyslipidemia and Diabetes.  Sonographer:     Rosalia Hammers Referring Phys:  [id number] AGBATA Diagnosing Phys: Darnell Level  Nehemiah Massed MD  Sonographer Comments: Image acquisition challenging due to patient body habitus and Image acquisition challenging due to respiratory motion. IMPRESSIONS  1. Left ventricular ejection fraction, by estimation, is 60 to 65%. The left ventricle has normal function. The left ventricle has no regional wall motion abnormalities. Left ventricular diastolic parameters were normal.  2. Right ventricular systolic function is normal. The right ventricular size is normal.  3. Left atrial size was mildly dilated.  4. The mitral valve is normal in structure. Mild to moderate mitral valve regurgitation.  5. The aortic valve is normal in structure. Aortic valve regurgitation is not visualized. FINDINGS  Left Ventricle: Left ventricular ejection fraction, by estimation, is 60 to 65%. The left ventricle has normal function. The left ventricle has no regional wall motion abnormalities. The left ventricular internal cavity size was normal in size. There is  no left ventricular hypertrophy. Left ventricular diastolic parameters were normal. Right Ventricle: The right ventricular size is normal. No increase in right ventricular wall thickness. Right ventricular systolic function is normal. Left Atrium: Left atrial size was mildly dilated. Right Atrium: Right atrial size was normal in size. Pericardium: There is no evidence of pericardial effusion. Mitral Valve: The mitral valve is normal in structure. Mild to moderate mitral valve regurgitation. Tricuspid Valve: The tricuspid valve is  normal in structure. Tricuspid valve regurgitation is mild. Aortic Valve: The aortic valve is normal in structure. Aortic valve regurgitation is not visualized. Aortic valve mean gradient measures 2.0 mmHg. Aortic valve peak gradient measures 4.2 mmHg. Aortic valve area, by VTI measures 2.96 cm. Pulmonic Valve: The pulmonic valve was normal in structure. Pulmonic valve regurgitation is trivial. Aorta: The aortic root and ascending aorta are structurally normal, with no evidence of dilitation. IAS/Shunts: No atrial level shunt detected by color flow Doppler.  LEFT VENTRICLE PLAX 2D LVIDd:         4.17 cm   Diastology LVIDs:         3.09 cm   LV e' medial:    9.57 cm/s LV PW:         1.29 cm   LV E/e' medial:  11.1 LV IVS:        1.59 cm   LV e' lateral:   13.40 cm/s LVOT diam:     2.10 cm   LV E/e' lateral: 7.9 LV SV:         64 LV SV Index:   29 LVOT Area:     3.46 cm  RIGHT VENTRICLE RV Basal diam:  4.13 cm RV S prime:     16.40 cm/s TAPSE (M-mode): 3.2 cm LEFT ATRIUM             Index        RIGHT ATRIUM           Index LA diam:        3.20 cm 1.44 cm/m   RA Area:     17.80 cm LA Vol (A2C):   95.0 ml 42.72 ml/m  RA Volume:   52.70 ml  23.70 ml/m LA Vol (A4C):   93.7 ml 42.14 ml/m LA Biplane Vol: 95.4 ml 42.90 ml/m  AORTIC VALVE AV Area (Vmax):    3.35 cm AV Area (Vmean):   2.92 cm AV Area (VTI):     2.96 cm AV Vmax:           102.00 cm/s AV Vmean:          67.400 cm/s AV VTI:  0.215 m AV Peak Grad:      4.2 mmHg AV Mean Grad:      2.0 mmHg LVOT Vmax:         98.60 cm/s LVOT Vmean:        56.800 cm/s LVOT VTI:          0.184 m LVOT/AV VTI ratio: 0.86  AORTA Ao Root diam: 3.80 cm MITRAL VALVE                TRICUSPID VALVE MV Area (PHT): 3.42 cm     TR Peak grad:   22.7 mmHg MV Decel Time: 222 msec     TR Vmax:        238.00 cm/s MV E velocity: 106.00 cm/s MV A velocity: 78.40 cm/s   SHUNTS MV E/A ratio:  1.35         Systemic VTI:  0.18 m                             Systemic Diam: 2.10 cm  Serafina Royals MD Electronically signed by Serafina Royals MD Signature Date/Time: 09/25/2021/3:44:05 PM    Final    CT Angio Chest/Abd/Pel for Dissection W and/or Wo Contrast  Result Date: 09/25/2021 CLINICAL DATA:  Acute aortic syndrome suspected. EXAM: CT ANGIOGRAPHY CHEST, ABDOMEN AND PELVIS TECHNIQUE: Non-contrast CT of the chest was initially obtained. Multidetector CT imaging through the chest, abdomen and pelvis was performed using the standard protocol during bolus administration of intravenous contrast. Multiplanar reconstructed images and MIPs were obtained and reviewed to evaluate the vascular anatomy. RADIATION DOSE REDUCTION: This exam was performed according to the departmental dose-optimization program which includes automated exposure control, adjustment of the mA and/or kV according to patient size and/or use of iterative reconstruction technique. CONTRAST:  151m OMNIPAQUE IOHEXOL 350 MG/ML SOLN COMPARISON:  CT January 11, 2012 FINDINGS: CTA CHEST FINDINGS Cardiovascular: Non contrasted sequence demonstrates minimal aortic atherosclerosis without evidence of intramural hematoma. Preferential opacification of the thoracic aorta postcontrast administration, without evidence of thoracic aortic aneurysm or dissection. No central pulmonary embolus on this nondedicated study. Normal heart size. No pericardial effusion. Mediastinum/Nodes: No suspicious thyroid nodule. No pathologically enlarged mediastinal, hilar or axillary lymph nodes. Patulous proximal esophagus with retained/refluxed material in the distal esophagus and mild distal esophageal wall thickening. Lungs/Pleura: Spiculated 10 mm right upper lobe pulmonary nodule on image 67/6. Solid 5 mm subpleural right lower lobe pulmonary nodule on image 136/6. No pleural effusion. No pneumothorax. Musculoskeletal: No acute osseous abnormality. Multilevel degenerative changes spine. Review of the MIP images confirms the above findings. CTA ABDOMEN  AND PELVIS FINDINGS VASCULAR Aorta: Aortic atherosclerosis. Normal caliber aorta without aneurysm, dissection, vasculitis or significant stenosis. Celiac: Patent without evidence of aneurysm, dissection, vasculitis or significant stenosis. SMA: Patent without evidence of aneurysm, dissection, vasculitis or significant stenosis. Renals: Renal arteries are patent without evidence of aneurysm, dissection, vasculitis, fibromuscular dysplasia or significant stenosis. IMA: Patent without evidence of aneurysm, dissection, vasculitis or significant stenosis. Inflow: Patent without evidence of aneurysm, dissection, vasculitis or significant stenosis. Veins: No obvious venous abnormality within the limitations of this arterial phase study. Review of the MIP images confirms the above findings. NON-VASCULAR Hepatobiliary: Bilobar arterially hyperenhancing hepatic lesions largest of which is in the posterior right lobe of the liver measuring 3.3 cm on image 109/5 which corresponds with a hypodense lesion seen on prior CT abdomen pelvis dated January 11, 2012 additional lesion in the dome of  the liver measures 10 mm on image 98/5 demonstrating peripheral nodular discontinuous enhancement and stable in size dating back to January 11, 2012, stability indicates a benign etiology with these lesions almost certainly reflecting benign hepatic hemangiomata. Gallbladder is unremarkable. No biliary ductal dilation. Pancreas: No pancreatic ductal dilation or evidence of acute inflammation. Spleen: No splenomegaly. Adrenals/Urinary Tract: Hypodense left interpolar renal lesion measures fluid density consistent with a cyst and considered benign requiring no imaging follow-up. Urinary bladder is unremarkable for degree of distension. Stomach/Bowel: No radiopaque enteric contrast material was administered. Stomach is unremarkable for degree of distension. No pathologic dilation of small or large bowel. No evidence of acute bowel  inflammation. Lymphatic: No pathologically enlarged abdominal or pelvic lymph nodes. Reproductive: Prostate is unremarkable. Other: No significant abdominopelvic free fluid. Musculoskeletal: No aggressive lytic or blastic lesion of bone. Review of the MIP images confirms the above findings. IMPRESSION: 1. No aortic aneurysm, dissection or intramural hematoma. 2. Spiculated 10 mm suspicious right solid pulmonary nodule within the upper lobe. Per Fleischner Society Guidelines, consider a non-contrast Chest CT at 3 months, a PET/CT, or tissue sampling. These guidelines do not apply to immunocompromised patients and patients with cancer. Follow up in patients with significant comorbidities as clinically warranted. For lung cancer screening, adhere to Lung-RADS guidelines. Reference: Radiology. 2017; 284(1):228-43. 3. Reflux versus retained contrast in a patulous proximal esophagus with mild distal esophageal wall thickening, correlate for symptoms of esophagitis/GERD and consider endoscopy on an outpatient basis. 4. Arterially enhancing bilobar hepatic lesions although incompletely characterized on this study are most consistent with benign hepatic hemangiomata. Consider more definitive characterization by nonemergent hepatic protocol MRI with and without contrast material. 5.  Aortic Atherosclerosis (ICD10-I70.0). Electronically Signed   By: Dahlia Bailiff M.D.   On: 09/25/2021 11:29   DG Chest 2 View  Result Date: 09/25/2021 CLINICAL DATA:  Chest pain and left arm pain. EXAM: CHEST - 2 VIEW COMPARISON:  Chest and abdominal frontal radiographs 08/18/2021, chest two views 03/12/2021 FINDINGS: Cardiac silhouette and mediastinal contours are within normal limits. The lungs are clear. No pleural effusion or pneumothorax. No acute skeletal abnormality. IMPRESSION: No active cardiopulmonary disease. Electronically Signed   By: Yvonne Kendall M.D.   On: 09/25/2021 10:17        Scheduled Meds:  aspirin EC  81 mg Oral  Daily   atorvastatin  40 mg Oral QPM   clopidogrel  75 mg Oral Daily   enoxaparin (LOVENOX) injection  40 mg Subcutaneous Q24H   insulin aspart  0-15 Units Subcutaneous TID WC   insulin glargine-yfgn  28 Units Subcutaneous QHS   lisinopril  10 mg Oral Daily   metoprolol tartrate  12.5 mg Oral BID   multivitamin with minerals  1 tablet Oral Daily   pantoprazole (PROTONIX) IV  40 mg Intravenous Q24H   sodium chloride flush  3 mL Intravenous Q12H   Continuous Infusions:  sodium chloride     [START ON 09/27/2021] sodium chloride       LOS: 0 days    Time spent: 35 mins     Wyvonnia Dusky, MD [facility] Pager [phone]  If 7PM-7AM, please contact night-coverage [email or web address] 09/26/2021, 8:26 AM

## 2021-09-26 NOTE — Progress Notes (Signed)
[facility] Encounter Note  Patient: Leonard Johnston / Admit Date: 09/25/2021 / Date of Encounter: 09/26/2021, 5:21 PM   Subjective: Patient overall has felt well today with no evidence of significant new chest discomfort.  He is relaxing well.  The patient is on appropriate guideline therapy at this time  Cardiac catheterization showing significant stenosis of left anterior descending artery causing his significant unstable angina. PCI and stent placement performed left anterior descending artery without complication.  Review of Systems: Positive for: None Negative for: Vision change, hearing change, syncope, dizziness, nausea, vomiting,diarrhea, bloody stool, stomach pain, cough, congestion, diaphoresis, urinary frequency, urinary pain,skin lesions, skin rashes Others previously listed  Objective: Telemetry: Normal sinus rhythm Physical Exam: Blood pressure 100/65, pulse 72, temperature 98.2 F (36.8 C), resp. rate 17, height '6\' 9"'$  (2.057 m), weight 82.1 kg, SpO2 100 %. Body mass index is 19.4 kg/m. General: Well developed, well nourished, in no acute distress. Head: Normocephalic, atraumatic, sclera non-icteric, no xanthomas, nares are without discharge. Neck: No apparent masses Lungs: Normal respirations with no wheezes, no rhonchi, no rales , no crackles   Heart: Regular rate and rhythm, normal S1 S2, no murmur, no rub, no gallop, PMI is normal size and placement, carotid upstroke normal without bruit, jugular venous pressure normal Abdomen: Soft, non-tender, non-distended with normoactive bowel sounds. No hepatosplenomegaly. Abdominal aorta is normal size without bruit Extremities: No edema, no clubbing, no cyanosis, no ulcers,  Peripheral: 2+ radial, 2+ femoral, 2+ dorsal pedal pulses Neuro: Alert and oriented. Moves all extremities spontaneously. Psych:  Responds to questions appropriately with a normal affect.  No intake or output data in the 24 hours ending  09/26/21 1721  Inpatient Medications:   [MAR Hold] aspirin EC  81 mg Oral Daily   [MAR Hold] atorvastatin  40 mg Oral QPM   [MAR Hold] clopidogrel  75 mg Oral Daily   [START ON 09/27/2021] enoxaparin (LOVENOX) injection  40 mg Subcutaneous Q24H   [MAR Hold] insulin aspart  0-15 Units Subcutaneous TID WC   [MAR Hold] insulin glargine-yfgn  28 Units Subcutaneous QHS   [MAR Hold] lisinopril  10 mg Oral Daily   [MAR Hold] metoprolol tartrate  12.5 mg Oral BID   [MAR Hold] multivitamin with minerals  1 tablet Oral Daily   [MAR Hold] pantoprazole (PROTONIX) IV  40 mg Intravenous Q24H   [MAR Hold] sodium chloride flush  3 mL Intravenous Q12H   sodium chloride flush  3 mL Intravenous Q12H   Infusions:   sodium chloride 75 mL/hr at 09/26/21 1707   sodium chloride     [MAR Hold] ondansetron (ZOFRAN) IV     [MAR Hold] promethazine (PHENERGAN) injection (IM or IVPB) 12.5 mg (09/26/21 0944)    Labs: Recent Labs    09/25/21 0953 09/26/21 0345  NA 142 139  K 4.3 4.6  CL 108 107  CO2 26 28  GLUCOSE 146* 253*  BUN 38* 26*  CREATININE 1.37* 1.10  CALCIUM 8.9 8.5*   Recent Labs    09/25/21 0953  AST 23  ALT 16  ALKPHOS 78  BILITOT 0.8  PROT 6.9  ALBUMIN 3.9   Recent Labs    09/25/21 0953 09/26/21 0345  WBC 4.2 2.6*  HGB 13.6 13.2  HCT 41.9 40.5  MCV 94.8 94.8  PLT 136* 96*   No results for input(s): "CKTOTAL", "CKMB", "TROPONINI" in the last 72 hours. Invalid input(s): "POCBNP" No results for input(s): "HGBA1C" in the last 72 hours.  Weights: Filed Weights   09/25/21 0952  Weight: 82.1 kg     Radiology/Studies:  ECHOCARDIOGRAM COMPLETE  Result Date: 09/25/2021    ECHOCARDIOGRAM REPORT   Patient Name:   Leonard Johnston Date of Exam: 09/25/2021 Medical Rec #:  [id number]    Height:       81.0 in Accession #:    [id number]   Weight:       181.0 lb Date of Birth:  08-Sep-1965    BSA:          2.224 m Patient Age:    56 years     BP:           140/94 mmHg Patient Gender: M             HR:           67 bpm. Exam Location:  [facility] Procedure: 2D Echo, Cardiac Doppler and Color Doppler Indications:     R07.9 Chest pain  History:         Patient has no prior history of Echocardiogram examinations.                  Left femoral artery stent, PAD; Risk Factors:Former Smoker,                  Hypertension, Dyslipidemia and Diabetes.  Sonographer:     Rosalia Hammers Referring Phys:  [id number] AGBATA Diagnosing Phys: Serafina Royals MD  Sonographer Comments: Image acquisition challenging due to patient body habitus and Image acquisition challenging due to respiratory motion. IMPRESSIONS  1. Left ventricular ejection fraction, by estimation, is 60 to 65%. The left ventricle has normal function. The left ventricle has no regional wall motion abnormalities. Left ventricular diastolic parameters were normal.  2. Right ventricular systolic function is normal. The right ventricular size is normal.  3. Left atrial size was mildly dilated.  4. The mitral valve is normal in structure. Mild to moderate mitral valve regurgitation.  5. The aortic valve is normal in structure. Aortic valve regurgitation is not visualized. FINDINGS  Left Ventricle: Left ventricular ejection fraction, by estimation, is 60 to 65%. The left ventricle has normal function. The left ventricle has no regional wall motion abnormalities. The left ventricular internal cavity size was normal in size. There is  no left ventricular hypertrophy. Left ventricular diastolic parameters were normal. Right Ventricle: The right ventricular size is normal. No increase in right ventricular wall thickness. Right ventricular systolic function is normal. Left Atrium: Left atrial size was mildly dilated. Right Atrium: Right atrial size was normal in size. Pericardium: There is no evidence of pericardial effusion. Mitral Valve: The mitral valve is normal in structure. Mild to moderate mitral valve regurgitation. Tricuspid Valve: The tricuspid valve is  normal in structure. Tricuspid valve regurgitation is mild. Aortic Valve: The aortic valve is normal in structure. Aortic valve regurgitation is not visualized. Aortic valve mean gradient measures 2.0 mmHg. Aortic valve peak gradient measures 4.2 mmHg. Aortic valve area, by VTI measures 2.96 cm. Pulmonic Valve: The pulmonic valve was normal in structure. Pulmonic valve regurgitation is trivial. Aorta: The aortic root and ascending aorta are structurally normal, with no evidence of dilitation. IAS/Shunts: No atrial level shunt detected by color flow Doppler.  LEFT VENTRICLE PLAX 2D LVIDd:         4.17 cm   Diastology LVIDs:         3.09 cm   LV e' medial:    9.57 cm/s LV  PW:         1.29 cm   LV E/e' medial:  11.1 LV IVS:        1.59 cm   LV e' lateral:   13.40 cm/s LVOT diam:     2.10 cm   LV E/e' lateral: 7.9 LV SV:         64 LV SV Index:   29 LVOT Area:     3.46 cm  RIGHT VENTRICLE RV Basal diam:  4.13 cm RV S prime:     16.40 cm/s TAPSE (M-mode): 3.2 cm LEFT ATRIUM             Index        RIGHT ATRIUM           Index LA diam:        3.20 cm 1.44 cm/m   RA Area:     17.80 cm LA Vol (A2C):   95.0 ml 42.72 ml/m  RA Volume:   52.70 ml  23.70 ml/m LA Vol (A4C):   93.7 ml 42.14 ml/m LA Biplane Vol: 95.4 ml 42.90 ml/m  AORTIC VALVE AV Area (Vmax):    3.35 cm AV Area (Vmean):   2.92 cm AV Area (VTI):     2.96 cm AV Vmax:           102.00 cm/s AV Vmean:          67.400 cm/s AV VTI:            0.215 m AV Peak Grad:      4.2 mmHg AV Mean Grad:      2.0 mmHg LVOT Vmax:         98.60 cm/s LVOT Vmean:        56.800 cm/s LVOT VTI:          0.184 m LVOT/AV VTI ratio: 0.86  AORTA Ao Root diam: 3.80 cm MITRAL VALVE                TRICUSPID VALVE MV Area (PHT): 3.42 cm     TR Peak grad:   22.7 mmHg MV Decel Time: 222 msec     TR Vmax:        238.00 cm/s MV E velocity: 106.00 cm/s MV A velocity: 78.40 cm/s   SHUNTS MV E/A ratio:  1.35         Systemic VTI:  0.18 m                             Systemic Diam: 2.10 cm  Serafina Royals MD Electronically signed by Serafina Royals MD Signature Date/Time: 09/25/2021/3:44:05 PM    Final    CT Angio Chest/Abd/Pel for Dissection W and/or Wo Contrast  Result Date: 09/25/2021 CLINICAL DATA:  Acute aortic syndrome suspected. EXAM: CT ANGIOGRAPHY CHEST, ABDOMEN AND PELVIS TECHNIQUE: Non-contrast CT of the chest was initially obtained. Multidetector CT imaging through the chest, abdomen and pelvis was performed using the standard protocol during bolus administration of intravenous contrast. Multiplanar reconstructed images and MIPs were obtained and reviewed to evaluate the vascular anatomy. RADIATION DOSE REDUCTION: This exam was performed according to the departmental dose-optimization program which includes automated exposure control, adjustment of the mA and/or kV according to patient size and/or use of iterative reconstruction technique. CONTRAST:  112m OMNIPAQUE IOHEXOL 350 MG/ML SOLN COMPARISON:  CT January 11, 2012 FINDINGS: CTA CHEST FINDINGS Cardiovascular: Non contrasted sequence demonstrates minimal aortic atherosclerosis without evidence  of intramural hematoma. Preferential opacification of the thoracic aorta postcontrast administration, without evidence of thoracic aortic aneurysm or dissection. No central pulmonary embolus on this nondedicated study. Normal heart size. No pericardial effusion. Mediastinum/Nodes: No suspicious thyroid nodule. No pathologically enlarged mediastinal, hilar or axillary lymph nodes. Patulous proximal esophagus with retained/refluxed material in the distal esophagus and mild distal esophageal wall thickening. Lungs/Pleura: Spiculated 10 mm right upper lobe pulmonary nodule on image 67/6. Solid 5 mm subpleural right lower lobe pulmonary nodule on image 136/6. No pleural effusion. No pneumothorax. Musculoskeletal: No acute osseous abnormality. Multilevel degenerative changes spine. Review of the MIP images confirms the above findings. CTA ABDOMEN  AND PELVIS FINDINGS VASCULAR Aorta: Aortic atherosclerosis. Normal caliber aorta without aneurysm, dissection, vasculitis or significant stenosis. Celiac: Patent without evidence of aneurysm, dissection, vasculitis or significant stenosis. SMA: Patent without evidence of aneurysm, dissection, vasculitis or significant stenosis. Renals: Renal arteries are patent without evidence of aneurysm, dissection, vasculitis, fibromuscular dysplasia or significant stenosis. IMA: Patent without evidence of aneurysm, dissection, vasculitis or significant stenosis. Inflow: Patent without evidence of aneurysm, dissection, vasculitis or significant stenosis. Veins: No obvious venous abnormality within the limitations of this arterial phase study. Review of the MIP images confirms the above findings. NON-VASCULAR Hepatobiliary: Bilobar arterially hyperenhancing hepatic lesions largest of which is in the posterior right lobe of the liver measuring 3.3 cm on image 109/5 which corresponds with a hypodense lesion seen on prior CT abdomen pelvis dated January 11, 2012 additional lesion in the dome of the liver measures 10 mm on image 98/5 demonstrating peripheral nodular discontinuous enhancement and stable in size dating back to January 11, 2012, stability indicates a benign etiology with these lesions almost certainly reflecting benign hepatic hemangiomata. Gallbladder is unremarkable. No biliary ductal dilation. Pancreas: No pancreatic ductal dilation or evidence of acute inflammation. Spleen: No splenomegaly. Adrenals/Urinary Tract: Hypodense left interpolar renal lesion measures fluid density consistent with a cyst and considered benign requiring no imaging follow-up. Urinary bladder is unremarkable for degree of distension. Stomach/Bowel: No radiopaque enteric contrast material was administered. Stomach is unremarkable for degree of distension. No pathologic dilation of small or large bowel. No evidence of acute bowel  inflammation. Lymphatic: No pathologically enlarged abdominal or pelvic lymph nodes. Reproductive: Prostate is unremarkable. Other: No significant abdominopelvic free fluid. Musculoskeletal: No aggressive lytic or blastic lesion of bone. Review of the MIP images confirms the above findings. IMPRESSION: 1. No aortic aneurysm, dissection or intramural hematoma. 2. Spiculated 10 mm suspicious right solid pulmonary nodule within the upper lobe. Per Fleischner Society Guidelines, consider a non-contrast Chest CT at 3 months, a PET/CT, or tissue sampling. These guidelines do not apply to immunocompromised patients and patients with cancer. Follow up in patients with significant comorbidities as clinically warranted. For lung cancer screening, adhere to Lung-RADS guidelines. Reference: Radiology. 2017; 284(1):228-43. 3. Reflux versus retained contrast in a patulous proximal esophagus with mild distal esophageal wall thickening, correlate for symptoms of esophagitis/GERD and consider endoscopy on an outpatient basis. 4. Arterially enhancing bilobar hepatic lesions although incompletely characterized on this study are most consistent with benign hepatic hemangiomata. Consider more definitive characterization by nonemergent hepatic protocol MRI with and without contrast material. 5.  Aortic Atherosclerosis (ICD10-I70.0). Electronically Signed   By: Dahlia Bailiff M.D.   On: 09/25/2021 11:29   DG Chest 2 View  Result Date: 09/25/2021 CLINICAL DATA:  Chest pain and left arm pain. EXAM: CHEST - 2 VIEW COMPARISON:  Chest and abdominal frontal radiographs 08/18/2021, chest two views  03/12/2021 FINDINGS: Cardiac silhouette and mediastinal contours are within normal limits. The lungs are clear. No pleural effusion or pneumothorax. No acute skeletal abnormality. IMPRESSION: No active cardiopulmonary disease. Electronically Signed   By: Yvonne Kendall M.D.   On: 09/25/2021 10:17     Assessment and Recommendation  56 y.o. male  with known hyperlipidemia hypertension and diabetes on appropriate medication management with unstable angina and significant stenosis of left anterior descending artery is now status post PCI and stent placement without complication. 1.  Dual antiplatelet therapy including Plavix 75 mg each day and aspirin 81 mg p.o. daily 2.  Continue high intensity cholesterol therapy with atorvastatin 3.  Metoprolol and lisinopril for hypertension control and risk management 4.  Continue diabetes control without change 5.  Begin cardiac rehabilitation 6.  If ambulating well tomorrow without any further significant symptoms would be okay for discharged home with follow-up in 1 week  Signed, Serafina Royals M.D. FACC

## 2021-09-26 NOTE — Inpatient Diabetes Management (Signed)
Inpatient Diabetes Program Recommendations  AACE/ADA: New Consensus Statement on Inpatient Glycemic Control  Target Ranges:  Prepandial:   less than 140 mg/dL      Peak postprandial:   less than 180 mg/dL (1-2 hours)      Critically ill patients:  140 - 180 mg/dL    Latest Reference Range & Units 09/26/21 07:32  Glucose-Capillary 70 - 99 mg/dL 220 (H)    Latest Reference Range & Units 08/25/21 08:19 08/25/21 09:55 08/25/21 11:02 08/25/21 11:25 08/25/21 11:44 08/25/21 12:02 09/25/21 15:15 09/25/21 17:46 09/25/21 21:08  Glucose-Capillary 70 - 99 mg/dL 306 (H) 150 (H) 69 (L) 64 (L) 82 112 (H) 194 (H) 255 (H) 201 (H)   Review of Glycemic Control  Diabetes history: DM1 (does not make any insulin; requires basal, correction, and meal coverage insulin) Outpatient Diabetes medications: Basaglar 28 units QHS, Novolog for correction and meal coverage (uses InPen app on his phone to calculate dose of Novolog) Current orders for Inpatient glycemic control: Semglee 28 units QHS, Novolog 0-15 units TID with meals  Inpatient Diabetes Program Recommendations:    Insulin: Please consider adding Novolog 0-5 units QHS for bedtime correction and ordering Novolog 3 units TID with meals for meal coverage if patient eats at least 50% of meals.  NOTE: Patient admitted with chest and arm pain on 09/25/21. Patient was last inpatient 08/19/21-08/25/21 and inpatient diabetes coordinator spoke with patient on 08/19/21. Patient was seeing Dr. Gabriel Carina and was last seen 06/23/21. Per office note on 06/23/21, patient requested to be prescribed Dexcom and wanted to start an insulin pump (prescribed OmniPod).  A1C 12.5% on 06/23/21. Noted telephone note on 09/06/21 which notes patient came by Endocrinology office requesting OmniPod settings and "Per Dr. Gabriel Carina, patient was Rx a pump but never came back. No settings were determined. He can call Omnipod if he has a pump and get it set up. He is no longer a patient in this practice".   Patient will need to get established with another Endocrinologist for assistance with DM management.   Thanks, Leonard Alderman, RN, MSN, [place] [facility] Coordinator Inpatient Diabetes Program [phone] (Team Pager from 8am to [place])

## 2021-09-27 ENCOUNTER — Encounter: Payer: Self-pay | Admitting: Internal Medicine

## 2021-09-27 LAB — BASIC METABOLIC PANEL
Anion gap: 5 (ref 5–15)
BUN: 23 mg/dL — ABNORMAL HIGH (ref 6–20)
CO2: 26 mmol/L (ref 22–32)
Calcium: 8.5 mg/dL — ABNORMAL LOW (ref 8.9–10.3)
Chloride: 109 mmol/L (ref 98–111)
Creatinine, Ser: 1.12 mg/dL (ref 0.61–1.24)
GFR, Estimated: >60 mL/min (ref >60)
Glucose, Bld: 224 mg/dL — ABNORMAL HIGH (ref 70–99)
Potassium: 4 mmol/L (ref 3.5–5.1)
Sodium: 140 mmol/L (ref 135–145)

## 2021-09-27 LAB — CBC
HCT: 37 % — ABNORMAL LOW (ref 39.0–52.0)
Hemoglobin: 12.2 g/dL — ABNORMAL LOW (ref 13.0–17.0)
MCH: 31 pg (ref 26.0–34.0)
MCHC: 33 g/dL (ref 30.0–36.0)
MCV: 94.1 fL (ref 80.0–100.0)
Platelets: 102 10*3/uL — ABNORMAL LOW (ref 150–400)
RBC: 3.93 MIL/uL — ABNORMAL LOW (ref 4.22–5.81)
RDW: 13.8 % (ref 11.5–15.5)
WBC: 3.9 10*3/uL — ABNORMAL LOW (ref 4.0–10.5)
nRBC: 0 % (ref 0.0–0.2)

## 2021-09-27 LAB — GLUCOSE, CAPILLARY
Glucose-Capillary: 131 mg/dL — ABNORMAL HIGH (ref 70–99)
Glucose-Capillary: 133 mg/dL — ABNORMAL HIGH (ref 70–99)

## 2021-09-27 MED ORDER — LISINOPRIL 10 MG PO TABS: 10.0000 mg | ORAL_TABLET | Freq: Every day | ORAL | 2 refills | Status: AC

## 2021-09-27 MED ORDER — INSULIN ASPART 100 UNIT/ML IJ SOLN
0.0000 [IU] | Freq: Three times a day (TID) | INTRAMUSCULAR | Status: DC
  Administered 2021-09-27: 1 [IU] via SUBCUTANEOUS
  Filled 2021-09-27: qty 1

## 2021-09-27 MED ORDER — ATORVASTATIN CALCIUM 40 MG PO TABS: 40.0000 mg | ORAL_TABLET | Freq: Every day | ORAL | 2 refills | Status: AC

## 2021-09-27 MED ORDER — INSULIN ASPART 100 UNIT/ML IJ SOLN: 0.0000 [IU] | Freq: Every day | INTRAMUSCULAR | Status: DC

## 2021-09-27 MED ORDER — CYCLOBENZAPRINE HCL 10 MG PO TABS
10.0000 mg | ORAL_TABLET | Freq: Once | ORAL | Status: AC
  Administered 2021-09-27: 10 mg via ORAL
  Filled 2021-09-27: qty 1

## 2021-09-27 MED ORDER — CLOPIDOGREL BISULFATE 75 MG PO TABS: 75.0000 mg | ORAL_TABLET | Freq: Every day | ORAL | 11 refills | Status: AC

## 2021-09-27 MED ORDER — METOPROLOL TARTRATE 25 MG PO TABS: 12.5000 mg | ORAL_TABLET | Freq: Two times a day (BID) | ORAL | 2 refills | Status: AC

## 2021-09-27 MED ORDER — CYCLOBENZAPRINE HCL 10 MG PO TABS: 10.0000 mg | ORAL_TABLET | Freq: Three times a day (TID) | ORAL | 0 refills | Status: AC | PRN

## 2021-09-27 MED ORDER — BASAGLAR KWIKPEN 100 UNIT/ML ~~LOC~~ SOPN: 28.0000 [IU] | PEN_INJECTOR | Freq: Every day | SUBCUTANEOUS | 2 refills | Status: AC

## 2021-09-27 NOTE — Inpatient Diabetes Management (Signed)
Inpatient Diabetes Program Recommendations  AACE/ADA: New Consensus Statement on Inpatient Glycemic Control   Target Ranges:  Prepandial:   less than 140 mg/dL      Peak postprandial:   less than 180 mg/dL (1-2 hours)      Critically ill patients:  140 - 180 mg/dL    Latest Reference Range & Units 09/27/21 04:47  Glucose 70 - 99 mg/dL 224 (H)    Latest Reference Range & Units 09/26/21 07:32 09/26/21 11:32 09/26/21 14:15 09/26/21 14:30 09/26/21 16:49 09/26/21 17:12 09/26/21 20:48  Glucose-Capillary 70 - 99 mg/dL 220 (H)  Novolog 5 units 154 (H)  Novolog 3 units 58 (L) 109 (H) 68 (L) 72 307 (H)   Semglee 28 units   Review of Glycemic Control  Diabetes history: DM1 (does not make any insulin; requires basal, correction, and meal coverage insulin) Outpatient Diabetes medications: Basaglar 28 units QHS, Novolog for correction and meal coverage (uses InPen app on his phone to calculate dose of Novolog) Current orders for Inpatient glycemic control: Semglee 28 units QHS, Novolog 0-15 units TID with meals   Inpatient Diabetes Program Recommendations:     Insulin: Please consider decreasing Novolog correction to 0-9 units TID with meals, adding Novolog 0-5 units QHS for bedtime correction and ordering Novolog 3 units TID with meals for meal coverage if patient eats at least 50% of meals.   NOTE: Patient admitted with chest and arm pain on 09/25/21. Patient was last inpatient 08/19/21-08/25/21 and inpatient diabetes coordinator spoke with patient on 08/19/21. Patient was seeing Dr. Gabriel Carina and was last seen 06/23/21. Per office note on 06/23/21, patient requested to be prescribed Dexcom and wanted to start an insulin pump (prescribed OmniPod).  A1C 12.5% on 06/23/21. Noted telephone note on 09/06/21 which notes patient came by Endocrinology office requesting OmniPod settings and "Per Dr. Gabriel Carina, patient was Rx a pump but never came back. No settings were determined. He can call Omnipod if he has a pump and  get it set up. He is no longer a patient in this practice".  Patient will need to get established with another Endocrinologist for assistance with DM management.    Thanks, Barnie Alderman, RN, MSN, [place] Diabetes Coordinator Inpatient Diabetes Program [phone] (Team Pager from 8am to [place])

## 2021-09-27 NOTE — Progress Notes (Signed)
[facility] Cardiology [facility] Encounter Note  Patient: Leonard Johnston / Admit Date: 09/25/2021 / Date of Encounter: 09/27/2021, 8:39 AM   Subjective: The patient has had episodes of chest discomfort which appear to be in the left upper chest and sharp.  This is radiating directly into his back without evidence of EKG changes and or troponin level elevation.  The patient did not have these chest pains with his PCI and intervention therefore is noncardiac.  It appears that he does have a CT scan which did not show any particular changes.    Cardiac catheterization showing significant stenosis of left anterior descending artery causing his significant unstable angina. PCI and stent placement performed left anterior descending artery without complication.  Review of Systems: Positive for: None Negative for: Vision change, hearing change, syncope, dizziness, nausea, vomiting,diarrhea, bloody stool, stomach pain, cough, congestion, diaphoresis, urinary frequency, urinary pain,skin lesions, skin rashes Others previously listed  Objective: Telemetry: Normal sinus rhythm Physical Exam: Blood pressure 122/80, pulse 73, temperature 98.3 F (36.8 C), resp. rate 14, height '6\' 9"'$  (2.057 m), weight 82.1 kg, SpO2 99 %. Body mass index is 19.4 kg/m. General: Well developed, well nourished, in no acute distress. Head: Normocephalic, atraumatic, sclera non-icteric, no xanthomas, nares are without discharge. Neck: No apparent masses Lungs: Normal respirations with no wheezes, no rhonchi, no rales , no crackles   Heart: Regular rate and rhythm, normal S1 S2, no murmur, no rub, no gallop, PMI is normal size and placement, carotid upstroke normal without bruit, jugular venous pressure normal Abdomen: Soft, non-tender, non-distended with normoactive bowel sounds. No hepatosplenomegaly. Abdominal aorta is normal size without bruit Extremities: No edema, no clubbing, no cyanosis, no ulcers,  Peripheral: 2+  radial, 2+ femoral, 2+ dorsal pedal pulses Neuro: Alert and oriented. Moves all extremities spontaneously. Psych:  Responds to questions appropriately with a normal affect.  No intake or output data in the 24 hours ending 09/27/21 0839  Inpatient Medications:   aspirin EC  81 mg Oral Daily   atorvastatin  40 mg Oral QPM   clopidogrel  75 mg Oral Daily   enoxaparin (LOVENOX) injection  40 mg Subcutaneous Q24H   insulin aspart  0-15 Units Subcutaneous TID WC   insulin glargine-yfgn  28 Units Subcutaneous QHS   lisinopril  10 mg Oral Daily   metoprolol tartrate  12.5 mg Oral BID   multivitamin with minerals  1 tablet Oral Daily   pantoprazole (PROTONIX) IV  40 mg Intravenous Q24H   sodium chloride flush  3 mL Intravenous Q12H   sodium chloride flush  3 mL Intravenous Q12H   Infusions:   sodium chloride Stopped (09/26/21 2249)   ondansetron (ZOFRAN) IV 8 mg (09/26/21 1803)   promethazine (PHENERGAN) injection (IM or IVPB) Stopped (09/26/21 2249)    Labs: Recent Labs    09/26/21 0345 09/27/21 0447  NA 139 140  K 4.6 4.0  CL 107 109  CO2 28 26  GLUCOSE 253* 224*  BUN 26* 23*  CREATININE 1.10 1.12  CALCIUM 8.5* 8.5*    Recent Labs    09/25/21 0953  AST 23  ALT 16  ALKPHOS 78  BILITOT 0.8  PROT 6.9  ALBUMIN 3.9    Recent Labs    09/26/21 0345 09/27/21 0447  WBC 2.6* 3.9*  HGB 13.2 12.2*  HCT 40.5 37.0*  MCV 94.8 94.1  PLT 96* 102*    No results for input(s): "CKTOTAL", "CKMB", "TROPONINI" in the last 72 hours. Invalid input(s): "POCBNP"  No results for input(s): "HGBA1C" in the last 72 hours.   Weights: Filed Weights   09/25/21 0952  Weight: 82.1 kg     Radiology/Studies:  CARDIAC CATHETERIZATION  Result Date: 09/26/2021 Conclusions: Multivessel coronary artery disease with severe proximal/mid LAD stenosis (see Dr. Alveria Apley diagnostic catheterization report for further details). Successful IVUS-guided PCI to proximal/mid LAD using Onyx Frontier  2.75 x 18 mm drug-eluting stent (postdilated to 3.1 mm) less than 10% residual stenosis and TIMI-3 flow. Recommendations: Continue dual antiplatelet therapy with aspirin and clopidogrel for at least 12 months. Recommend medical therapy of residual LAD and LCx disease, including aggressive secondary prevention with high intensity statin therapy and aggressive glycemic control. Nelva Bush, MD [facility]  ECHOCARDIOGRAM COMPLETE  Result Date: 09/25/2021    ECHOCARDIOGRAM REPORT   Patient Name:   Leonard Johnston Date of Exam: 09/25/2021 Medical Rec #:  [id number]    Height:       81.0 in Accession #:    [id number]   Weight:       181.0 lb Date of Birth:  [birth year]    BSA:          2.224 m Patient Age:    56 years     BP:           140/94 mmHg Patient Gender: M            HR:           67 bpm. Exam Location:  [facility] Procedure: 2D Echo, Cardiac Doppler and Color Doppler Indications:     R07.9 Chest pain  History:         Patient has no prior history of Echocardiogram examinations.                  Left femoral artery stent, PAD; Risk Factors:Former Smoker,                  Hypertension, Dyslipidemia and Diabetes.  Sonographer:     Rosalia Hammers Referring Phys:  [id number] AGBATA Diagnosing Phys: Serafina Royals MD  Sonographer Comments: Image acquisition challenging due to patient body habitus and Image acquisition challenging due to respiratory motion. IMPRESSIONS  1. Left ventricular ejection fraction, by estimation, is 60 to 65%. The left ventricle has normal function. The left ventricle has no regional wall motion abnormalities. Left ventricular diastolic parameters were normal.  2. Right ventricular systolic function is normal. The right ventricular size is normal.  3. Left atrial size was mildly dilated.  4. The mitral valve is normal in structure. Mild to moderate mitral valve regurgitation.  5. The aortic valve is normal in structure. Aortic valve regurgitation is not visualized. FINDINGS  Left  Ventricle: Left ventricular ejection fraction, by estimation, is 60 to 65%. The left ventricle has normal function. The left ventricle has no regional wall motion abnormalities. The left ventricular internal cavity size was normal in size. There is  no left ventricular hypertrophy. Left ventricular diastolic parameters were normal. Right Ventricle: The right ventricular size is normal. No increase in right ventricular wall thickness. Right ventricular systolic function is normal. Left Atrium: Left atrial size was mildly dilated. Right Atrium: Right atrial size was normal in size. Pericardium: There is no evidence of pericardial effusion. Mitral Valve: The mitral valve is normal in structure. Mild to moderate mitral valve regurgitation. Tricuspid Valve: The tricuspid valve is normal in structure. Tricuspid valve regurgitation is mild. Aortic Valve: The aortic valve is normal in structure. Aortic valve  regurgitation is not visualized. Aortic valve mean gradient measures 2.0 mmHg. Aortic valve peak gradient measures 4.2 mmHg. Aortic valve area, by VTI measures 2.96 cm. Pulmonic Valve: The pulmonic valve was normal in structure. Pulmonic valve regurgitation is trivial. Aorta: The aortic root and ascending aorta are structurally normal, with no evidence of dilitation. IAS/Shunts: No atrial level shunt detected by color flow Doppler.  LEFT VENTRICLE PLAX 2D LVIDd:         4.17 cm   Diastology LVIDs:         3.09 cm   LV e' medial:    9.57 cm/s LV PW:         1.29 cm   LV E/e' medial:  11.1 LV IVS:        1.59 cm   LV e' lateral:   13.40 cm/s LVOT diam:     2.10 cm   LV E/e' lateral: 7.9 LV SV:         64 LV SV Index:   29 LVOT Area:     3.46 cm  RIGHT VENTRICLE RV Basal diam:  4.13 cm RV S prime:     16.40 cm/s TAPSE (M-mode): 3.2 cm LEFT ATRIUM             Index        RIGHT ATRIUM           Index LA diam:        3.20 cm 1.44 cm/m   RA Area:     17.80 cm LA Vol (A2C):   95.0 ml 42.72 ml/m  RA Volume:   52.70 ml   23.70 ml/m LA Vol (A4C):   93.7 ml 42.14 ml/m LA Biplane Vol: 95.4 ml 42.90 ml/m  AORTIC VALVE AV Area (Vmax):    3.35 cm AV Area (Vmean):   2.92 cm AV Area (VTI):     2.96 cm AV Vmax:           102.00 cm/s AV Vmean:          67.400 cm/s AV VTI:            0.215 m AV Peak Grad:      4.2 mmHg AV Mean Grad:      2.0 mmHg LVOT Vmax:         98.60 cm/s LVOT Vmean:        56.800 cm/s LVOT VTI:          0.184 m LVOT/AV VTI ratio: 0.86  AORTA Ao Root diam: 3.80 cm MITRAL VALVE                TRICUSPID VALVE MV Area (PHT): 3.42 cm     TR Peak grad:   22.7 mmHg MV Decel Time: 222 msec     TR Vmax:        238.00 cm/s MV E velocity: 106.00 cm/s MV A velocity: 78.40 cm/s   SHUNTS MV E/A ratio:  1.35         Systemic VTI:  0.18 m                             Systemic Diam: 2.10 cm Serafina Royals MD Electronically signed by Serafina Royals MD Signature Date/Time: 09/25/2021/3:44:05 PM    Final    CT Angio Chest/Abd/Pel for Dissection W and/or Wo Contrast  Result Date: 09/25/2021 CLINICAL DATA:  Acute aortic syndrome suspected. EXAM: CT ANGIOGRAPHY CHEST, ABDOMEN AND PELVIS  TECHNIQUE: Non-contrast CT of the chest was initially obtained. Multidetector CT imaging through the chest, abdomen and pelvis was performed using the standard protocol during bolus administration of intravenous contrast. Multiplanar reconstructed images and MIPs were obtained and reviewed to evaluate the vascular anatomy. RADIATION DOSE REDUCTION: This exam was performed according to the departmental dose-optimization program which includes automated exposure control, adjustment of the mA and/or kV according to patient size and/or use of iterative reconstruction technique. CONTRAST:  167m OMNIPAQUE IOHEXOL 350 MG/ML SOLN COMPARISON:  CT January 11, 2012 FINDINGS: CTA CHEST FINDINGS Cardiovascular: Non contrasted sequence demonstrates minimal aortic atherosclerosis without evidence of intramural hematoma. Preferential opacification of the thoracic  aorta postcontrast administration, without evidence of thoracic aortic aneurysm or dissection. No central pulmonary embolus on this nondedicated study. Normal heart size. No pericardial effusion. Mediastinum/Nodes: No suspicious thyroid nodule. No pathologically enlarged mediastinal, hilar or axillary lymph nodes. Patulous proximal esophagus with retained/refluxed material in the distal esophagus and mild distal esophageal wall thickening. Lungs/Pleura: Spiculated 10 mm right upper lobe pulmonary nodule on image 67/6. Solid 5 mm subpleural right lower lobe pulmonary nodule on image 136/6. No pleural effusion. No pneumothorax. Musculoskeletal: No acute osseous abnormality. Multilevel degenerative changes spine. Review of the MIP images confirms the above findings. CTA ABDOMEN AND PELVIS FINDINGS VASCULAR Aorta: Aortic atherosclerosis. Normal caliber aorta without aneurysm, dissection, vasculitis or significant stenosis. Celiac: Patent without evidence of aneurysm, dissection, vasculitis or significant stenosis. SMA: Patent without evidence of aneurysm, dissection, vasculitis or significant stenosis. Renals: Renal arteries are patent without evidence of aneurysm, dissection, vasculitis, fibromuscular dysplasia or significant stenosis. IMA: Patent without evidence of aneurysm, dissection, vasculitis or significant stenosis. Inflow: Patent without evidence of aneurysm, dissection, vasculitis or significant stenosis. Veins: No obvious venous abnormality within the limitations of this arterial phase study. Review of the MIP images confirms the above findings. NON-VASCULAR Hepatobiliary: Bilobar arterially hyperenhancing hepatic lesions largest of which is in the posterior right lobe of the liver measuring 3.3 cm on image 109/5 which corresponds with a hypodense lesion seen on prior CT abdomen pelvis dated January 11, 2012 additional lesion in the dome of the liver measures 10 mm on image 98/5 demonstrating peripheral  nodular discontinuous enhancement and stable in size dating back to January 11, 2012, stability indicates a benign etiology with these lesions almost certainly reflecting benign hepatic hemangiomata. Gallbladder is unremarkable. No biliary ductal dilation. Pancreas: No pancreatic ductal dilation or evidence of acute inflammation. Spleen: No splenomegaly. Adrenals/Urinary Tract: Hypodense left interpolar renal lesion measures fluid density consistent with a cyst and considered benign requiring no imaging follow-up. Urinary bladder is unremarkable for degree of distension. Stomach/Bowel: No radiopaque enteric contrast material was administered. Stomach is unremarkable for degree of distension. No pathologic dilation of small or large bowel. No evidence of acute bowel inflammation. Lymphatic: No pathologically enlarged abdominal or pelvic lymph nodes. Reproductive: Prostate is unremarkable. Other: No significant abdominopelvic free fluid. Musculoskeletal: No aggressive lytic or blastic lesion of bone. Review of the MIP images confirms the above findings. IMPRESSION: 1. No aortic aneurysm, dissection or intramural hematoma. 2. Spiculated 10 mm suspicious right solid pulmonary nodule within the upper lobe. Per Fleischner Society Guidelines, consider a non-contrast Chest CT at 3 months, a PET/CT, or tissue sampling. These guidelines do not apply to immunocompromised patients and patients with cancer. Follow up in patients with significant comorbidities as clinically warranted. For lung cancer screening, adhere to Lung-RADS guidelines. Reference: Radiology. 2017; 284(1):228-43. 3. Reflux versus retained contrast in a patulous proximal  esophagus with mild distal esophageal wall thickening, correlate for symptoms of esophagitis/GERD and consider endoscopy on an outpatient basis. 4. Arterially enhancing bilobar hepatic lesions although incompletely characterized on this study are most consistent with benign hepatic  hemangiomata. Consider more definitive characterization by nonemergent hepatic protocol MRI with and without contrast material. 5.  Aortic Atherosclerosis (ICD10-I70.0). Electronically Signed   By: Dahlia Bailiff M.D.   On: 09/25/2021 11:29   DG Chest 2 View  Result Date: 09/25/2021 CLINICAL DATA:  Chest pain and left arm pain. EXAM: CHEST - 2 VIEW COMPARISON:  Chest and abdominal frontal radiographs 08/18/2021, chest two views 03/12/2021 FINDINGS: Cardiac silhouette and mediastinal contours are within normal limits. The lungs are clear. No pleural effusion or pneumothorax. No acute skeletal abnormality. IMPRESSION: No active cardiopulmonary disease. Electronically Signed   By: Yvonne Kendall M.D.   On: 09/25/2021 10:17     Assessment and Recommendation  56 y.o. male with known hyperlipidemia hypertension and diabetes on appropriate medication management with unstable angina and significant stenosis of left anterior descending artery is now status post PCI and stent placement without complication.  Now the patient has intermittent sharp left upper chest pain noncardiac in nature 1.  Dual antiplatelet therapy including Plavix 75 mg each day and aspirin 81 mg p.o. daily 2.  Continue high intensity cholesterol therapy with atorvastatin 3.  Metoprolol and lisinopril for hypertension control and risk management 4.  Continue diabetes control without change 5.  Begin cardiac rehabilitation 6.  Further evaluation of noncardiac chest discomfort as needed with and without ambulation. 7.  Okay for discharge home from cardiac standpoint if ambulating well and further treatment options as per above.  Patient should have follow-up in 1 week.  Call if further questions  Signed, Serafina Royals M.D. FACC

## 2021-09-27 NOTE — Discharge Summary (Signed)
Physician Discharge Summary   Leonard Johnston  male DOB: 04/25/1965  YWV:[id number]  PCP: Derinda Late, MD  Admit date: 09/25/2021 Discharge date: 09/27/2021  Admitted From: home Disposition:  home CODE STATUS: Full code  Discharge Instructions     AMB Referral to Cardiac Rehabilitation - Phase II   Complete by: As directed    Diagnosis: Coronary Stents   After initial evaluation and assessments completed: Virtual Based Care may be provided alone or in conjunction with Phase 2 Cardiac Rehab based on patient barriers.: Yes   Diet - low sodium heart healthy   Complete by: As directed    Discharge instructions   Complete by: As directed    You have received a cardiac stent for coronary artery blockage.  You have to take aspirin 81 mg and plavix 75 mg together daily for 1 year.  You have a 10 mm lung nodule that you need to follow up with PCP for repeat CT scan in 3 months. - -   No wound care   Complete by: As directed       Hospital Course:  For full details, please see H&P, progress notes, consult notes and ancillary notes.  Briefly,  Leonard Johnston is a 56 y.o. male with medical history significant for insulin dependent diabetes mellitus, PAD status post stent angioplasty to his left lower extremity, dyslipidemia, who presented to the ER for evaluation of chest pain that started approximately about 7 AM after he had breakfast.  Chest pain 2/2 unstable angina CAD Troponin neg x 2.  Cardiac catheterization showed significant stenosis of left anterior descending artery.  PCI and stent placement performed left anterior descending artery without complication. --ASA 81 and plavix 75 mg daily for 1 year --cont statin --cardiac rehab and cardio outpatient f/u in 1 week.  PAD: s/p left lower extremity angioplasty with stent placement for nonhealing diabetic foot ulcer (06/23). Continue on plavix, aspirin, statin    GERD:  CT shows reflux versus retained contrast in a patulous  proximal esophagus with mild distal esophageal wall thickening, correlate for symptoms of esophagitis/GERD.   Pt was offered outpatient GI referral to consider endoscopy, however, pt denied GERD symptoms and declined outpatient GI referral. --cont pepcid.   HTN:  continue on metoprolol, lisinopril    DM1:  Not DM2 poorly controlled.  --cont home insulin regimen as below.   Solitary lung nodule:  noted to have a spiculated 10 mm suspicious right solid pulmonary nodule within. Needs f/u w/ PCP or pulmonary for CT chest in 3 months outpatient, PET scan or biopsy   Thrombocytopenia: etiology unclear.  CKD ruled out  Unless noted above, medications under "STOP" list are ones pt was not taking PTA.  Discharge Diagnoses:  Principal Problem:   Chest pain Active Problems:   PAD (peripheral artery disease) (HCC)   GERD (gastroesophageal reflux disease)   Essential hypertension   Diabetes mellitus with retinopathy ([place])   Solitary lung nodule   Unstable angina ([place])   30 Day Unplanned Readmission Risk Score    Flowsheet Row ED to Hosp-Admission (Current) from 09/25/2021 in [facility]  30 Day Unplanned Readmission Risk Score (%) 18.51 Filed at 09/27/2021 0801       This score is the patient's risk of an unplanned readmission within 30 days of being discharged (0 -100%). The score is based on dignosis, age, lab data, medications, orders, and past utilization.   Low:  0-14.9   Medium: 15-21.9  High: 22-29.9   Extreme: 30 and above         Discharge Instructions:  Allergies as of 09/27/2021       Reactions   Codeine Rash        Medication List     STOP taking these medications    mupirocin ointment 2 % Commonly known as: BACTROBAN       TAKE these medications    aspirin EC 81 MG tablet Take 1 tablet (81 mg total) by mouth daily. Swallow whole.   atorvastatin 40 MG tablet Commonly known as: LIPITOR Take 1 tablet (40 mg total) by mouth  daily.   Basaglar KwikPen 100 UNIT/ML Inject 28 Units into the skin at bedtime. Home dose. What changed:  how much to take additional instructions   blood glucose meter kit and supplies Kit Dispense based on patient and insurance preference. Use three times a day   clopidogrel 75 MG tablet Commonly known as: PLAVIX Take 1 tablet (75 mg total) by mouth daily.   cyclobenzaprine 10 MG tablet Commonly known as: FLEXERIL Take 1 tablet (10 mg total) by mouth 3 (three) times daily as needed for muscle spasms.   famotidine 40 MG tablet Commonly known as: PEPCID Take 1 tablet by mouth daily.   insulin aspart 100 UNIT/ML FlexPen Commonly known as: NOVOLOG Inject 0-13 Units into the skin 3 (three) times daily with meals. Per sliding scale on smart phone.   lisinopril 10 MG tablet Commonly known as: ZESTRIL Take 1 tablet (10 mg total) by mouth daily.   metoprolol tartrate 25 MG tablet Commonly known as: LOPRESSOR Take 0.5 tablets (12.5 mg total) by mouth 2 (two) times daily.   Multi-Vitamins Tabs Take by mouth.   sildenafil 100 MG tablet Commonly known as: VIAGRA Take 100 mg by mouth daily as needed.         Follow-up Information     Corey Skains, MD. Go in 1 week(s).   Specialty: Cardiology Why: Appointment on Tuesday, 10/31/2021 at 2:30pm. You have been placed on a cancellation list, but please call often for earlier appointment. Contact information: [address] [facility] [place] [address] [phone]         Derinda Late, MD. Schedule an appointment as soon as possible for a visit in 1 week(s).   Specialty: Family Medicine Why: The doctor's office will call you directly to schedule appointment. Contact information: [address] [facility] [place] [address] [phone]                 Allergies  Allergen Reactions   Codeine Rash     The results of  significant diagnostics from this hospitalization (including imaging, microbiology, ancillary and laboratory) are listed below for reference.   Consultations:   Procedures/Studies: CARDIAC CATHETERIZATION  Result Date: 09/27/2021   Prox Cx to Mid Cx lesion is 40% stenosed.   Mid LAD lesion is 60% stenosed.   Ost LM lesion is 30% stenosed.   Prox LAD to Mid LAD lesion is 85% stenosed.   The left ventricular systolic function is normal.   The left ventricular ejection fraction is 55-65% by visual estimate. 56yo with unstable angina Normal lv funciton ef 55% Significant stenosis of lad Plan Pci of lad Statin Htn control rehab   CARDIAC CATHETERIZATION  Result Date: 09/26/2021 Conclusions: Multivessel coronary artery disease with severe proximal/mid LAD stenosis (see Dr. Alveria Apley diagnostic catheterization report for further details).  Successful IVUS-guided PCI to proximal/mid LAD using Onyx Frontier 2.75 x 18 mm drug-eluting stent (postdilated to 3.1 mm) less than 10% residual stenosis and TIMI-3 flow. Recommendations: Continue dual antiplatelet therapy with aspirin and clopidogrel for at least 12 months. Recommend medical therapy of residual LAD and LCx disease, including aggressive secondary prevention with high intensity statin therapy and aggressive glycemic control. Nelva Bush, MD [facility]  ECHOCARDIOGRAM COMPLETE  Result Date: 09/25/2021    ECHOCARDIOGRAM REPORT   Patient Name:   CAIDYN HENRICKSEN Date of Exam: 09/25/2021 Medical Rec #:  [id number]    Height:       81.0 in Accession #:    [id number]   Weight:       181.0 lb Date of Birth:  Jun 26, 1965    BSA:          2.224 m Patient Age:    15 years     BP:           140/94 mmHg Patient Gender: M            HR:           67 bpm. Exam Location:  [facility] Procedure: 2D Echo, Cardiac Doppler and Color Doppler Indications:     R07.9 Chest pain  History:         Patient has no prior history of Echocardiogram examinations.                  Left femoral  artery stent, PAD; Risk Factors:Former Smoker,                  Hypertension, Dyslipidemia and Diabetes.  Sonographer:     Rosalia Hammers Referring Phys:  [id number] HUDJSHFW AGBATA Diagnosing Phys: Serafina Royals MD  Sonographer Comments: Image acquisition challenging due to patient body habitus and Image acquisition challenging due to respiratory motion. IMPRESSIONS  1. Left ventricular ejection fraction, by estimation, is 60 to 65%. The left ventricle has normal function. The left ventricle has no regional wall motion abnormalities. Left ventricular diastolic parameters were normal.  2. Right ventricular systolic function is normal. The right ventricular size is normal.  3. Left atrial size was mildly dilated.  4. The mitral valve is normal in structure. Mild to moderate mitral valve regurgitation.  5. The aortic valve is normal in structure. Aortic valve regurgitation is not visualized. FINDINGS  Left Ventricle: Left ventricular ejection fraction, by estimation, is 60 to 65%. The left ventricle has normal function. The left ventricle has no regional wall motion abnormalities. The left ventricular internal cavity size was normal in size. There is  no left ventricular hypertrophy. Left ventricular diastolic parameters were normal. Right Ventricle: The right ventricular size is normal. No increase in right ventricular wall thickness. Right ventricular systolic function is normal. Left Atrium: Left atrial size was mildly dilated. Right Atrium: Right atrial size was normal in size. Pericardium: There is no evidence of pericardial effusion. Mitral Valve: The mitral valve is normal in structure. Mild to moderate mitral valve regurgitation. Tricuspid Valve: The tricuspid valve is normal in structure. Tricuspid valve regurgitation is mild. Aortic Valve: The aortic valve is normal in structure. Aortic valve regurgitation is not visualized. Aortic valve mean gradient measures 2.0 mmHg. Aortic valve peak gradient measures 4.2  mmHg. Aortic valve area, by VTI measures 2.96 cm. Pulmonic Valve: The pulmonic valve was normal in structure. Pulmonic valve regurgitation is trivial. Aorta: The aortic root and ascending aorta are structurally normal, with no evidence of  dilitation. IAS/Shunts: No atrial level shunt detected by color flow Doppler.  LEFT VENTRICLE PLAX 2D LVIDd:         4.17 cm   Diastology LVIDs:         3.09 cm   LV e' medial:    9.57 cm/s LV PW:         1.29 cm   LV E/e' medial:  11.1 LV IVS:        1.59 cm   LV e' lateral:   13.40 cm/s LVOT diam:     2.10 cm   LV E/e' lateral: 7.9 LV SV:         64 LV SV Index:   29 LVOT Area:     3.46 cm  RIGHT VENTRICLE RV Basal diam:  4.13 cm RV S prime:     16.40 cm/s TAPSE (M-mode): 3.2 cm LEFT ATRIUM             Index        RIGHT ATRIUM           Index LA diam:        3.20 cm 1.44 cm/m   RA Area:     17.80 cm LA Vol (A2C):   95.0 ml 42.72 ml/m  RA Volume:   52.70 ml  23.70 ml/m LA Vol (A4C):   93.7 ml 42.14 ml/m LA Biplane Vol: 95.4 ml 42.90 ml/m  AORTIC VALVE AV Area (Vmax):    3.35 cm AV Area (Vmean):   2.92 cm AV Area (VTI):     2.96 cm AV Vmax:           102.00 cm/s AV Vmean:          67.400 cm/s AV VTI:            0.215 m AV Peak Grad:      4.2 mmHg AV Mean Grad:      2.0 mmHg LVOT Vmax:         98.60 cm/s LVOT Vmean:        56.800 cm/s LVOT VTI:          0.184 m LVOT/AV VTI ratio: 0.86  AORTA Ao Root diam: 3.80 cm MITRAL VALVE                TRICUSPID VALVE MV Area (PHT): 3.42 cm     TR Peak grad:   22.7 mmHg MV Decel Time: 222 msec     TR Vmax:        238.00 cm/s MV E velocity: 106.00 cm/s MV A velocity: 78.40 cm/s   SHUNTS MV E/A ratio:  1.35         Systemic VTI:  0.18 m                             Systemic Diam: 2.10 cm Serafina Royals MD Electronically signed by Serafina Royals MD Signature Date/Time: 09/25/2021/3:44:05 PM    Final    CT Angio Chest/Abd/Pel for Dissection W and/or Wo Contrast  Result Date: 09/25/2021 CLINICAL DATA:  Acute aortic syndrome suspected.  EXAM: CT ANGIOGRAPHY CHEST, ABDOMEN AND PELVIS TECHNIQUE: Non-contrast CT of the chest was initially obtained. Multidetector CT imaging through the chest, abdomen and pelvis was performed using the standard protocol during bolus administration of intravenous contrast. Multiplanar reconstructed images and MIPs were obtained and reviewed to evaluate the vascular anatomy. RADIATION DOSE REDUCTION: This exam was performed according to the  departmental dose-optimization program which includes automated exposure control, adjustment of the mA and/or kV according to patient size and/or use of iterative reconstruction technique. CONTRAST:  115m OMNIPAQUE IOHEXOL 350 MG/ML SOLN COMPARISON:  CT January 11, 2012 FINDINGS: CTA CHEST FINDINGS Cardiovascular: Non contrasted sequence demonstrates minimal aortic atherosclerosis without evidence of intramural hematoma. Preferential opacification of the thoracic aorta postcontrast administration, without evidence of thoracic aortic aneurysm or dissection. No central pulmonary embolus on this nondedicated study. Normal heart size. No pericardial effusion. Mediastinum/Nodes: No suspicious thyroid nodule. No pathologically enlarged mediastinal, hilar or axillary lymph nodes. Patulous proximal esophagus with retained/refluxed material in the distal esophagus and mild distal esophageal wall thickening. Lungs/Pleura: Spiculated 10 mm right upper lobe pulmonary nodule on image 67/6. Solid 5 mm subpleural right lower lobe pulmonary nodule on image 136/6. No pleural effusion. No pneumothorax. Musculoskeletal: No acute osseous abnormality. Multilevel degenerative changes spine. Review of the MIP images confirms the above findings. CTA ABDOMEN AND PELVIS FINDINGS VASCULAR Aorta: Aortic atherosclerosis. Normal caliber aorta without aneurysm, dissection, vasculitis or significant stenosis. Celiac: Patent without evidence of aneurysm, dissection, vasculitis or significant stenosis. SMA: Patent  without evidence of aneurysm, dissection, vasculitis or significant stenosis. Renals: Renal arteries are patent without evidence of aneurysm, dissection, vasculitis, fibromuscular dysplasia or significant stenosis. IMA: Patent without evidence of aneurysm, dissection, vasculitis or significant stenosis. Inflow: Patent without evidence of aneurysm, dissection, vasculitis or significant stenosis. Veins: No obvious venous abnormality within the limitations of this arterial phase study. Review of the MIP images confirms the above findings. NON-VASCULAR Hepatobiliary: Bilobar arterially hyperenhancing hepatic lesions largest of which is in the posterior right lobe of the liver measuring 3.3 cm on image 109/5 which corresponds with a hypodense lesion seen on prior CT abdomen pelvis dated January 11, 2012 additional lesion in the dome of the liver measures 10 mm on image 98/5 demonstrating peripheral nodular discontinuous enhancement and stable in size dating back to January 11, 2012, stability indicates a benign etiology with these lesions almost certainly reflecting benign hepatic hemangiomata. Gallbladder is unremarkable. No biliary ductal dilation. Pancreas: No pancreatic ductal dilation or evidence of acute inflammation. Spleen: No splenomegaly. Adrenals/Urinary Tract: Hypodense left interpolar renal lesion measures fluid density consistent with a cyst and considered benign requiring no imaging follow-up. Urinary bladder is unremarkable for degree of distension. Stomach/Bowel: No radiopaque enteric contrast material was administered. Stomach is unremarkable for degree of distension. No pathologic dilation of small or large bowel. No evidence of acute bowel inflammation. Lymphatic: No pathologically enlarged abdominal or pelvic lymph nodes. Reproductive: Prostate is unremarkable. Other: No significant abdominopelvic free fluid. Musculoskeletal: No aggressive lytic or blastic lesion of bone. Review of the MIP images  confirms the above findings. IMPRESSION: 1. No aortic aneurysm, dissection or intramural hematoma. 2. Spiculated 10 mm suspicious right solid pulmonary nodule within the upper lobe. Per Fleischner Society Guidelines, consider a non-contrast Chest CT at 3 months, a PET/CT, or tissue sampling. These guidelines do not apply to immunocompromised patients and patients with cancer. Follow up in patients with significant comorbidities as clinically warranted. For lung cancer screening, adhere to Lung-RADS guidelines. Reference: Radiology. 2017; 284(1):228-43. 3. Reflux versus retained contrast in a patulous proximal esophagus with mild distal esophageal wall thickening, correlate for symptoms of esophagitis/GERD and consider endoscopy on an outpatient basis. 4. Arterially enhancing bilobar hepatic lesions although incompletely characterized on this study are most consistent with benign hepatic hemangiomata. Consider more definitive characterization by nonemergent hepatic protocol MRI with and without contrast material. 5.  Aortic Atherosclerosis (ICD10-I70.0). Electronically Signed   By: Dahlia Bailiff M.D.   On: 09/25/2021 11:29   DG Chest 2 View  Result Date: 09/25/2021 CLINICAL DATA:  Chest pain and left arm pain. EXAM: CHEST - 2 VIEW COMPARISON:  Chest and abdominal frontal radiographs 08/18/2021, chest two views 03/12/2021 FINDINGS: Cardiac silhouette and mediastinal contours are within normal limits. The lungs are clear. No pleural effusion or pneumothorax. No acute skeletal abnormality. IMPRESSION: No active cardiopulmonary disease. Electronically Signed   By: Yvonne Kendall M.D.   On: 09/25/2021 10:17      Labs: BNP (last 3 results) No results for input(s): "BNP" in the last 8760 hours. Basic Metabolic Panel: Recent Labs  Lab 09/25/21 0953 09/26/21 0345 09/27/21 0447  NA 142 139 140  K 4.3 4.6 4.0  CL 108 107 109  CO2 26 28 26   GLUCOSE 146* 253* 224*  BUN 38* 26* 23*  CREATININE 1.37* 1.10  1.12  CALCIUM 8.9 8.5* 8.5*   Liver Function Tests: Recent Labs  Lab 09/25/21 0953  AST 23  ALT 16  ALKPHOS 78  BILITOT 0.8  PROT 6.9  ALBUMIN 3.9   Recent Labs  Lab 09/25/21 0953  LIPASE 27   No results for input(s): "AMMONIA" in the last 168 hours. CBC: Recent Labs  Lab 09/25/21 0953 09/26/21 0345 09/27/21 0447  WBC 4.2 2.6* 3.9*  HGB 13.6 13.2 12.2*  HCT 41.9 40.5 37.0*  MCV 94.8 94.8 94.1  PLT 136* 96* 102*   Cardiac Enzymes: No results for input(s): "CKTOTAL", "CKMB", "CKMBINDEX", "TROPONINI" in the last 168 hours. BNP: Invalid input(s): "POCBNP" CBG: Recent Labs  Lab 09/26/21 1415 09/26/21 1430 09/26/21 1649 09/26/21 1712 09/26/21 2048  GLUCAP 58* 109* 68* 72 307*   D-Dimer No results for input(s): "DDIMER" in the last 72 hours. Hgb A1c No results for input(s): "HGBA1C" in the last 72 hours. Lipid Profile Recent Labs    09/26/21 0345  CHOL 157  HDL 43  LDLCALC 84  TRIG 151*  CHOLHDL 3.7   Thyroid function studies No results for input(s): "TSH", "T4TOTAL", "T3FREE", "THYROIDAB" in the last 72 hours.  Invalid input(s): "FREET3" Anemia work up No results for input(s): "VITAMINB12", "FOLATE", "FERRITIN", "TIBC", "IRON", "RETICCTPCT" in the last 72 hours. Urinalysis    Component Value Date/Time   COLORURINE YELLOW (A) 08/18/2021 1925   APPEARANCEUR CLEAR (A) 08/18/2021 1925   APPEARANCEUR Clear 03/02/2014 1943   LABSPEC 1.021 08/18/2021 1925   LABSPEC 1.017 03/02/2014 1943   PHURINE 5.0 08/18/2021 1925   GLUCOSEU >=500 (A) 08/18/2021 1925   GLUCOSEU >=500 03/02/2014 1943   HGBUR SMALL (A) 08/18/2021 1925   BILIRUBINUR NEGATIVE 08/18/2021 1925   BILIRUBINUR Negative 03/02/2014 1943   KETONESUR 80 (A) 08/18/2021 1925   PROTEINUR 30 (A) 08/18/2021 1925   NITRITE NEGATIVE 08/18/2021 1925   LEUKOCYTESUR NEGATIVE 08/18/2021 1925   LEUKOCYTESUR Negative 03/02/2014 1943   Sepsis Labs Recent Labs  Lab 09/25/21 0953 09/26/21 0345  09/27/21 0447  WBC 4.2 2.6* 3.9*   Microbiology No results found for this or any previous visit (from the past 240 hour(s)).   Total time spend on discharging this patient, including the last patient exam, discussing the hospital stay, instructions for ongoing care as it relates to all pertinent caregivers, as well as preparing the medical discharge records, prescriptions, and/or referrals as applicable, is 45 minutes.    Enzo Bi, MD  [facility] 09/27/2021, 11:10 AM

## 2021-09-28 LAB — LIPOPROTEIN A (LPA): Lipoprotein (a): <8.4 nmol/L (ref <75.0)

## 2021-10-02 DIAGNOSIS — R918 Other nonspecific abnormal finding of lung field: Secondary | ICD-10-CM | POA: Insufficient documentation

## 2021-10-02 DIAGNOSIS — I739 Peripheral vascular disease, unspecified: Secondary | ICD-10-CM

## 2021-10-02 DIAGNOSIS — R634 Abnormal weight loss: Secondary | ICD-10-CM | POA: Insufficient documentation

## 2021-10-02 DIAGNOSIS — D61818 Other pancytopenia: Secondary | ICD-10-CM | POA: Insufficient documentation

## 2021-12-25 ENCOUNTER — Encounter (INDEPENDENT_AMBULATORY_CARE_PROVIDER_SITE_OTHER): Payer: Self-pay

## 2022-08-17 DIAGNOSIS — J441 Chronic obstructive pulmonary disease with (acute) exacerbation: Secondary | ICD-10-CM | POA: Insufficient documentation

## 2022-09-28 ENCOUNTER — Ambulatory Visit
Admission: EM | Admit: 2022-09-28 | Discharge: 2022-09-28 | Disposition: A | Discharge status: Home / Self Care | Payer: 59

## 2022-09-28 ENCOUNTER — Telehealth: Payer: Self-pay

## 2022-09-28 DIAGNOSIS — E103599 Type 1 diabetes mellitus with proliferative diabetic retinopathy without macular edema, unspecified eye: Secondary | ICD-10-CM

## 2022-09-28 NOTE — Telephone Encounter (Signed)
Referral completed for patient (as discussed with provider).   B. Roten CMA

## 2022-09-28 NOTE — ED Notes (Signed)
Discussed with patient prior to vital signs (per provider) that the Rx can't be given here at Urgent Care. He will need to obtain from original prescriber or new endocrinologist. Ambulatory Referral made and information sent to Endocrinology on behalf of patient.  B. Roten CMA

## 2022-09-28 NOTE — ED Triage Notes (Signed)
"  I just moved to this area and I need a refill on my insulin pump omnipod generation 5".   Last Glucose (per Dexom): 209 (during Intake)  No other concerns.

## 2022-10-04 ENCOUNTER — Telehealth: Payer: Self-pay

## 2022-10-04 NOTE — Telephone Encounter (Signed)
Called [facility] and spoke with Byrd Hesselbach, transferred to [facility] (referrals) re: status of Urgent referral and contacting patient. Shanda Bumps will check with Christiane Ha and get back with me re: status asap.   B. Roten CMA

## 2022-10-04 NOTE — Telephone Encounter (Signed)
Update

## 2022-12-01 ENCOUNTER — Encounter (HOSPITAL_COMMUNITY): Payer: Self-pay | Admitting: Emergency Medicine

## 2022-12-01 ENCOUNTER — Emergency Department (HOSPITAL_COMMUNITY)
Admission: EM | Admit: 2022-12-01 | Discharge: 2022-12-01 | Disposition: A | Discharge status: Home / Self Care | Payer: 59 | Attending: Emergency Medicine | Admitting: Emergency Medicine

## 2022-12-01 ENCOUNTER — Other Ambulatory Visit: Payer: Self-pay

## 2022-12-01 ENCOUNTER — Emergency Department (HOSPITAL_COMMUNITY): Payer: 59

## 2022-12-01 DIAGNOSIS — Z79899 Other long term (current) drug therapy: Secondary | ICD-10-CM | POA: Diagnosis not present

## 2022-12-01 DIAGNOSIS — E1022 Type 1 diabetes mellitus with diabetic chronic kidney disease: Secondary | ICD-10-CM | POA: Diagnosis not present

## 2022-12-01 DIAGNOSIS — R7402 Elevation of levels of lactic acid dehydrogenase (LDH): Secondary | ICD-10-CM | POA: Insufficient documentation

## 2022-12-01 DIAGNOSIS — N184 Chronic kidney disease, stage 4 (severe): Secondary | ICD-10-CM | POA: Diagnosis not present

## 2022-12-01 DIAGNOSIS — L03116 Cellulitis of left lower limb: Secondary | ICD-10-CM | POA: Diagnosis not present

## 2022-12-01 DIAGNOSIS — Z7982 Long term (current) use of aspirin: Secondary | ICD-10-CM | POA: Diagnosis not present

## 2022-12-01 DIAGNOSIS — I129 Hypertensive chronic kidney disease with stage 1 through stage 4 chronic kidney disease, or unspecified chronic kidney disease: Secondary | ICD-10-CM | POA: Diagnosis not present

## 2022-12-01 DIAGNOSIS — Z7984 Long term (current) use of oral hypoglycemic drugs: Secondary | ICD-10-CM | POA: Insufficient documentation

## 2022-12-01 DIAGNOSIS — R2242 Localized swelling, mass and lump, left lower limb: Secondary | ICD-10-CM | POA: Diagnosis present

## 2022-12-01 DIAGNOSIS — L03032 Cellulitis of left toe: Secondary | ICD-10-CM

## 2022-12-01 LAB — COMPREHENSIVE METABOLIC PANEL
ALT: 19 U/L (ref 0–44)
AST: 18 U/L (ref 15–41)
Albumin: 3.6 g/dL (ref 3.5–5.0)
Alkaline Phosphatase: 83 U/L (ref 38–126)
Anion gap: 9 (ref 5–15)
BUN: 32 mg/dL — ABNORMAL HIGH (ref 6–20)
CO2: 29 mmol/L (ref 22–32)
Calcium: 9.1 mg/dL (ref 8.9–10.3)
Chloride: 105 mmol/L (ref 98–111)
Creatinine, Ser: 1.16 mg/dL (ref 0.61–1.24)
GFR, Estimated: >60 mL/min (ref >60)
Glucose, Bld: 58 mg/dL — ABNORMAL LOW (ref 70–99)
Potassium: 4.7 mmol/L (ref 3.5–5.1)
Sodium: 143 mmol/L (ref 135–145)
Total Bilirubin: 0.7 mg/dL (ref 0.3–1.2)
Total Protein: 6.8 g/dL (ref 6.5–8.1)

## 2022-12-01 LAB — CBC WITH DIFFERENTIAL/PLATELET
Abs Immature Granulocytes: 0.02 10*3/uL (ref 0.00–0.07)
Basophils Absolute: 0 10*3/uL (ref 0.0–0.1)
Basophils Relative: 0 %
Eosinophils Absolute: 0.2 10*3/uL (ref 0.0–0.5)
Eosinophils Relative: 2 %
HCT: 43.7 % (ref 39.0–52.0)
Hemoglobin: 13.8 g/dL (ref 13.0–17.0)
Immature Granulocytes: 0 %
Lymphocytes Relative: 23 %
Lymphs Abs: 1.9 10*3/uL (ref 0.7–4.0)
MCH: 31.2 pg (ref 26.0–34.0)
MCHC: 31.6 g/dL (ref 30.0–36.0)
MCV: 98.6 fL (ref 80.0–100.0)
Monocytes Absolute: 0.6 10*3/uL (ref 0.1–1.0)
Monocytes Relative: 7 %
Neutro Abs: 5.5 10*3/uL (ref 1.7–7.7)
Neutrophils Relative %: 68 %
Platelets: 194 10*3/uL (ref 150–400)
RBC: 4.43 MIL/uL (ref 4.22–5.81)
RDW: 14.5 % (ref 11.5–15.5)
WBC: 8.3 10*3/uL (ref 4.0–10.5)
nRBC: 0 % (ref 0.0–0.2)

## 2022-12-01 LAB — LACTIC ACID, PLASMA: Lactic Acid, Venous: 2.5 mmol/L (ref 0.5–1.9)

## 2022-12-01 MED ORDER — DOXYCYCLINE HYCLATE 100 MG PO TABS: 100.0000 mg | ORAL_TABLET | Freq: Two times a day (BID) | ORAL | Status: DC

## 2022-12-01 MED ORDER — DOXYCYCLINE HYCLATE 100 MG PO CAPS
100.0000 mg | ORAL_CAPSULE | Freq: Two times a day (BID) | ORAL | 0 refills | Status: AC
Start: 2022-12-01 — End: 2022-12-11

## 2022-12-01 MED ORDER — DOXYCYCLINE HYCLATE 100 MG PO TABS
100.0000 mg | ORAL_TABLET | Freq: Once | ORAL | Status: AC
  Administered 2022-12-01: 100 mg via ORAL
  Filled 2022-12-01: qty 1

## 2022-12-01 MED ORDER — VANCOMYCIN HCL IN DEXTROSE 1-5 GM/200ML-% IV SOLN
1000.0000 mg | Freq: Once | INTRAVENOUS | Status: AC
  Administered 2022-12-01: 1000 mg via INTRAVENOUS
  Filled 2022-12-01: qty 200

## 2022-12-01 NOTE — ED Notes (Signed)
Pt given crackers, peanut butter, and drink to increase blood sugar.

## 2022-12-01 NOTE — Discharge Instructions (Addendum)
Thank you for letting us evaluate you today.  Your x-ray was negative for acute osteomyelitis/infection of bone.  There was some soft tissue swelling noted to x-ray.  Your labs are within normal limits.  You do have an elevated lactic acid which is related to tissue breakdown and likely related to cellulitis of your toe. you have received 1 dose of antibiotic here in the emergency department.  I have sent a prescription for that antibiotic to your [facility] pharmacy on [facility] [address].  You may start the next dose of antibiotic tomorrow morning.  You may experience GI upset, nausea, diarrhea with antibiotic  Please follow-up with podiatry or whoever managed your amputations in the past.  Return to emergency department if you experiencing worsening of swelling, skin changes to toe to include red or black, fever

## 2022-12-01 NOTE — ED Provider Notes (Cosign Needed Addendum)
[place] [facility]MENT AT [facility] Provider Note   CSN: [id number] Arrival date & time: 12/01/22  1404     History  Chief Complaint  Patient presents with   Cellulitis    Left Foot    Leonard Johnston is a 57 y.o. 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  He reports that pain radiates up to left knee. He was seen via virtual visit at urgent care on 09/28/2022 for similar and then prescribed him cephalexin.  He has taken 1 dose of cephalexin and has been NWB since urgent care visit.  He denies fever, known injury.  HPI     Home Medications Prior to Admission medications   Medication Sig Start Date End Date Taking? Authorizing Provider  doxycycline (VIBRAMYCIN) 100 MG capsule Take 1 capsule (100 mg total) by mouth 2 (two) times daily for 10 days. 12/01/22 12/11/22 Yes Judithann Sheen, PA  albuterol (VENTOLIN HFA) 108 (90 Base) MCG/ACT inhaler Inhale 2 puffs into the lungs every 4 (four) hours as needed for wheezing or shortness of breath. 08/19/22   [provider]  aspirin EC 81 MG tablet Take 1 tablet (81 mg total) by mouth daily. Swallow whole. 08/26/21   Enedina Finner, MD  atorvastatin (LIPITOR) 40 MG tablet Take 1 tablet (40 mg total) by mouth daily. 09/27/21 09/28/22  Darlin Priestly, MD  azithromycin (ZITHROMAX) 250 MG tablet Take 250 mg by mouth as directed. 08/19/22   [provider]  blood glucose meter kit and supplies KIT Dispense based on patient and insurance preference. Use three times a day 08/19/21   Alford Highland, MD  Continuous Glucose Sensor (DEXCOM G6 SENSOR) MISC SMARTSIG:Topical Every 10 Days    [provider]  Continuous Glucose Transmitter (DEXCOM G6 TRANSMITTER) MISC Use transmitter for continuous glucose monitoring 01/06/22    [provider]  cyanocobalamin (VITAMIN B12) 1000 MCG tablet Take 1,000 mcg by mouth daily.    [provider]  cyclobenzaprine (FLEXERIL) 10 MG tablet Take 1 tablet (10 mg total) by mouth 3 (three) times daily as needed for muscle spasms. 09/27/21   Darlin Priestly, MD  famotidine (PEPCID) 40 MG tablet Take 1 tablet by mouth daily.    [provider]  ferrous sulfate 325 (65 FE) MG tablet Take 325 mg by mouth daily with breakfast.    [provider]  gabapentin (NEURONTIN) 100 MG capsule Take 100 mg by mouth 3 (three) times daily.    [provider]  insulin aspart (NOVOLOG) 100 UNIT/ML FlexPen Inject 0-13 Units into the skin 3 (three) times daily with meals. Per sliding scale on smart phone.    Raj Janus, MD  Insulin Glargine [facility]) 100 UNIT/ML Inject 28 Units into the skin at bedtime. Home dose. 09/27/21 12/26/21  Darlin Priestly, MD  insulin lispro (HUMALOG) 100 UNIT/ML injection Inject into the skin as directed.    [provider]  lisinopril (ZESTRIL) 10 MG tablet Take 1 tablet (10 mg total) by mouth daily. 09/27/21 12/26/21  Darlin Priestly, MD  metoprolol succinate (TOPROL-XL) 25 MG 24 hr tablet Take 25 mg by mouth daily.    [provider]  metoprolol tartrate (LOPRESSOR) 25 MG tablet Take 0.5 tablets (12.5 mg total) by mouth 2 (two) times daily. 09/27/21 12/26/21  Fran Lowes,  Inetta Fermo, MD  Multiple Vitamin (MULTI-VITAMINS) TABS Take by mouth.    [provider]  predniSONE (DELTASONE) 20 MG tablet Take 20 mg by mouth 2 (two) times daily. 08/19/22   [provider]  sildenafil (VIAGRA) 100 MG tablet Take 100 mg by mouth daily as needed. 07/27/21   [provider]  prazosin (MINIPRESS) 5 MG capsule Take 1 capsule (5 mg total) by mouth at bedtime. For nightmares 01/22/18 10/18/19  Jomarie Longs, MD  propranolol (INDERAL) 10 MG tablet Take 1 tablet (10 mg total) by mouth 3 (three) times daily as needed. Only for severe  agitation/anxiety 01/22/18 10/18/19  Jomarie Longs, MD  ranitidine (ZANTAC) 75 MG tablet Take 75 mg by mouth 2 (two) times daily.  10/18/19  [provider]  sertraline (ZOLOFT) 50 MG tablet Take 1.5 tablets (75 mg total) by mouth daily. 01/22/18 10/18/19  Jomarie Longs, MD  traZODone (DESYREL) 50 MG tablet TAKE 1/2 TO 1 (ONE-HALF TO ONE) TABLET BY MOUTH AT BEDTIME AS NEEDED FOR SLEEP 01/22/18 10/18/19  Jomarie Longs, MD      Allergies    Codeine and Other    Review of Systems   Review of Systems  Constitutional:  Negative for chills, fatigue and fever.  Respiratory:  Negative for cough, chest tightness, shortness of breath and wheezing.   Cardiovascular:  Negative for chest pain and palpitations.  Gastrointestinal:  Negative for abdominal pain, constipation, diarrhea, nausea and vomiting.  Musculoskeletal:  Positive for arthralgias.  Neurological:  Negative for dizziness, seizures, weakness, light-headedness, numbness and headaches.    Physical Exam Updated Vital Signs BP (!) 149/89 (BP Location: Right Arm)   Pulse 93   Temp 98.1 F (36.7 C)   Resp 17   Ht 6\' 8"  (2.032 m)   Wt 78.9 kg   SpO2 97%   BMI 19.11 kg/m  Physical Exam Vitals and nursing note reviewed.  Constitutional:      General: He is not in acute distress.    Appearance: Normal appearance. He is not ill-appearing.  HENT:     Head: Normocephalic and atraumatic.  Eyes:     Conjunctiva/sclera: Conjunctivae normal.  Cardiovascular:     Rate and Rhythm: Normal rate.     Pulses: Normal pulses.     Comments: Dorsalis pedis 2+ and equal Pulmonary:     Effort: Pulmonary effort is normal. No respiratory distress.     Breath sounds: Normal breath sounds.  Skin:    Capillary Refill: Capillary refill takes less than 2 seconds.     Coloration: Skin is not jaundiced or pale.     Comments: Warmth with mild swelling to dorsal foot Mild swelling, yellow discoloration to third phalanx 1cm old laceration  lateral to nail (See media for third phalanx) No warmth, swelling, erythema to left ankle nor calf  Neurological:     Mental Status: He is alert. Mental status is at baseline.     Sensory: Sensory deficit (Peripheral neuropathy at baseline) present.     Comments: LE motor function equal bilaterally     ED Results / Procedures / Treatments   Labs (all labs ordered are listed, but only abnormal results are displayed) Labs Reviewed  COMPREHENSIVE METABOLIC PANEL - Abnormal; Notable for the following components:      Result Value   Glucose, Bld 58 (*)    BUN 32 (*)    All other components within normal limits  LACTIC ACID, PLASMA - Abnormal; Notable for the following components:  Lactic Acid, Venous 2.5 (*)    All other components within normal limits  CULTURE, BLOOD (ROUTINE X 2)  CULTURE, BLOOD (ROUTINE X 2)  CBC WITH DIFFERENTIAL/PLATELET    EKG None  Radiology DG Foot Complete Left  Result Date: 12/01/2022 CLINICAL DATA:  Cellulitis. Anterior foot and third digit. Type 1 diabetes. EXAM: LEFT FOOT - COMPLETE 3+ VIEW COMPARISON:  Left foot radiographs 08/21/2021 and 11/05/2020; MRI left forefoot 08/21/2021 and 09/27/2020 FINDINGS: Redemonstration of amputation of fifth digit of the mid metatarsal shaft and the second digit to the base of the proximal phalanx. Mild hallux valgus. Mild great toe metatarsophalangeal joint space narrowing. Moderate great toe subchondral cystic change with surrounding sclerosis and peripheral osteophytosis, similar to prior. Mild dorsal talonavicular degenerative spurring. Mildly angulated an old healed fracture of the shaft of the proximal phalanx of the fourth toe. Mild diffuse soft tissue swelling. No cortical erosion is seen to indicate radiographic evidence of acute osteomyelitis. IMPRESSION: 1. No radiographic evidence of acute osteomyelitis. 2. Mild diffuse soft tissue swelling. 3. Moderate great toe metatarsophalangeal osteoarthritis. Chronic  subchondral cystic changes and surrounding sclerosis are similar to prior radiographs and consistent with the questioned erosive or inflammatory arthritis on prior 08/21/2021 and 09/27/2020 MRIs. Electronically Signed   By: Neita Garnet M.D.   On: 12/01/2022 16:00    Procedures Procedures    Medications Ordered in ED Medications  doxycycline (VIBRA-TABS) tablet 100 mg (has no administration in time range)    ED Course/ Medical Decision Making/ A&P                                 Medical Decision Making Amount and/or Complexity of Data Reviewed Labs: ordered. Radiology: ordered.  Risk Prescription drug management.    Patient presents to the ED for concern of left leg swelling, this involves an extensive number of treatment options, and is a complaint that carries with it a high risk of complications and morbidity.  The differential diagnosis includes cellulitis, osteomyelitis, fracture   Co morbidities that complicate the patient evaluation  Diabetes type I   Additional history obtained:  Additional history obtained from Family, Nursing, Outside Medical Records, and Past Admission   External records from outside source obtained upon chart review   Lab Tests:  I Ordered, and personally interpreted labs.  The pertinent results include: Elevated lactate (2.5)   Imaging Studies ordered:  I ordered imaging studies including x-ray of left foot I independently visualized and interpreted imaging which showed no acute osteomyelitis.  Some soft tissue swelling (see report for full report) I agree with the radiologist interpretation   Cardiac Monitoring:  No further cardiac monitoring was required for treatment or disposition plan.  Patient has no complaints of chest pain, shortness breath, arrhythmia.  No tachycardia noted in ED   Medicines ordered and prescription drug management:  I ordered medication including doxycycline for cellulitis with MRSA  coverage Reevaluation of the patient after these medicines showed that the patient stayed the same I have reviewed the patients home medicines and have made adjustments as needed   Problem List / ED Course:  Cellulitis of left toe   Reevaluation:  After the interventions noted above, I reevaluated the patient and found that they have :stayed the same   Dispostion:  Upon evaluation, patient is resting comfortably in bed.  He is not ill-appearing.  Vital signs WNL.  See HPI.  Upon assessment, there  is a 1 cm old laceration lateral to the nail of left third phalanx.  There is yellow discoloration, mild swelling, warmth of left 3rd phalanx. There is no warmth, erythema to ankle, calf.  Patient has significant peripheral neuropathy from diabetes and likely injured toe without his knowledge as he has limited sensation to his left foot.  Will obtain lab work, lactic acid, blood culture due to past medical history of osteomyelitis, sepsis, and MRSA in type 1 diabetic. XR of left foot pending. Lactic acid elevated at 2.5 and likely due to cellulitis of toe. Patient is not ill appearing with VS WNL.  Left foot x-ray is not significant for acute osteomyelitis.  There is mild diffuse soft tissue swelling.  Will stop cephalexin that was prescribed by urgent care as it does not have MRSA coverage and provide 1 dose of doxycycline and a gram of vanc in emergency department.  I sent prescription for 10 days of doxycycline to his pharmacy  After consideration of the diagnostic results and the patients response to treatment, I feel that the patent would benefit from outpatient management with doxycycline and follow-up with podiatry.  Patient is not ill-appearing, vital signs WNL and no acute osteomyelitis, I do not feel that patient would benefit from admission and IV antibiotics at this time.  Discussed findings, treatment plan, return to emergency department precautions with patient expresses understanding  agrees with plan.  Discussed side effects of antibiotics with patient who expresses understanding.  Prior to emergency department precautions including worsening swelling, skin changes to toe, fever.   Dr. Jerral Bonito independently assessed patient and reviewed results.  Discussed results and treatment plan with him and he agrees with plan.   Final Clinical Impression(s) / ED Diagnoses Final diagnoses:  Cellulitis of toe of left foot    Rx / DC Orders ED Discharge Orders          Ordered    doxycycline (VIBRAMYCIN) 100 MG capsule  2 times daily        12/01/22 1614              Judithann Sheen, PA 12/01/22 1624    Judithann Sheen, PA 12/01/22 1630    Eber Hong, MD 12/02/22 1114

## 2022-12-01 NOTE — ED Triage Notes (Signed)
Pt presents with cellulitis to anterior left foot and 3rd digit, last two digits removed, per pt type 1 diabetic and has stent in left leg for circulation.

## 2022-12-06 LAB — CULTURE, BLOOD (ROUTINE X 2)
Culture: NO GROWTH
Culture: NO GROWTH

## 2022-12-29 IMAGING — MR MR FOOT*L* WO/W CM
9 series · 40 of 40 positions shown · IV contrast (gadavist)
Comparison: Left foot radiograph 09/27/2020

CLINICAL DATA: Foot swelling, diabetic, osteomyelitis suspected,
xray done

EXAM:
MRI OF THE LEFT FOREFOOT WITHOUT AND WITH CONTRAST
TECHNIQUE: Multiplanar, multisequence MR imaging of the left foot was performed
both before and after administration of intravenous contrast.
CONTRAST:  7mL GADAVIST GADOBUTROL 1 MMOL/ML IV SOLN

[Series 6: T1 · coronal · left · 3.0mm · 0.38mm/px · 7 of 47 slices shown (1 of 2)]
[im 1/47]
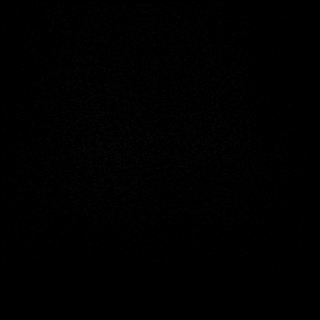
[im 8/47]
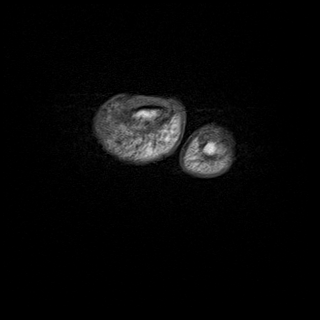
[im 16/47]
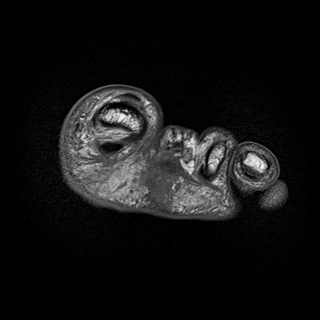
[im 24/47]
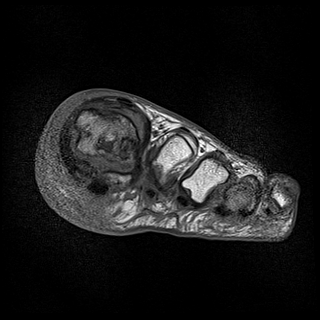
[im 31/47]
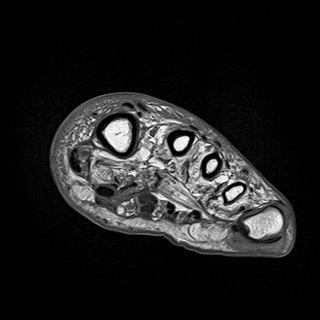
[im 39/47]
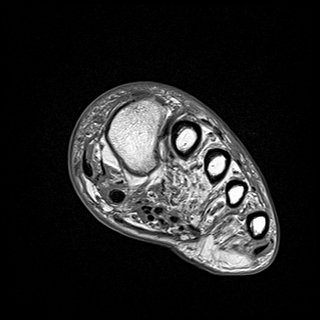
[im 47/47]
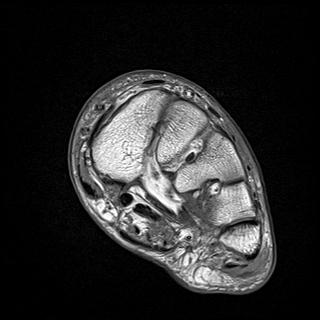

[Series 8: T2 · coronal · left · 3.0mm · 0.50mm/px · 7 of 45 slices shown (1 of 2)]
[im 1/45]
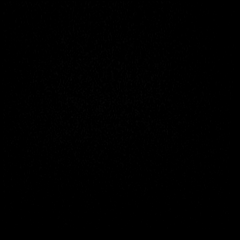
[im 8/45]
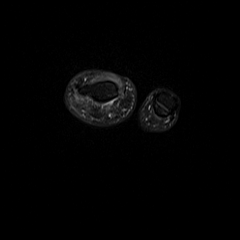
[im 15/45]
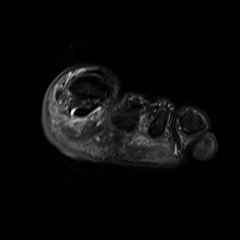
[im 23/45]
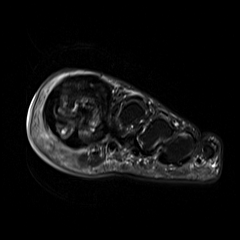
[im 30/45]
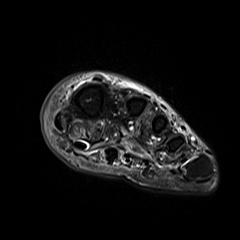
[im 37/45]
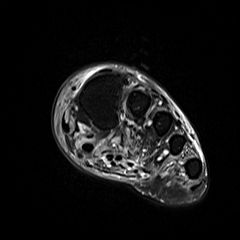
[im 45/45]
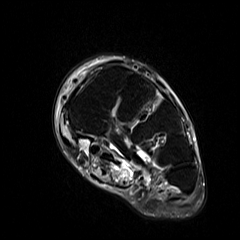

[Series 9: T1 · axial · left · 3.0mm · 0.70mm/px · z∈[-77,-14]mm · 2 of 19 slices shown (2 of 2)]
[im 1/19]
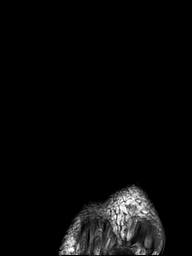
[im 19/19]
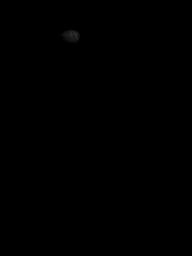

[Series 11: T2 · axial · left · 3.0mm · 0.70mm/px · z∈[-77,-14]mm · 2 of 19 slices shown (2 of 2)]
[im 1/19]
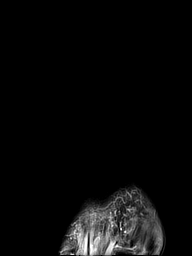
[im 19/19]
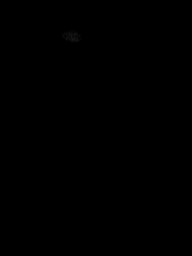

[Series 12: STIR · sagittal · left · 3.0mm · 0.62mm/px · 4 of 31 slices shown]
[im 1/31]
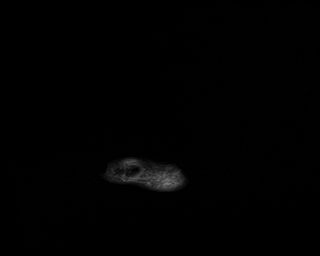
[im 11/31]
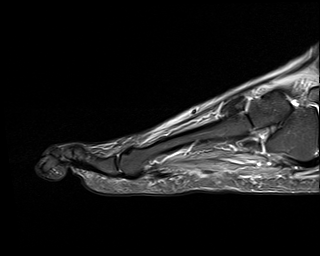
[im 21/31]
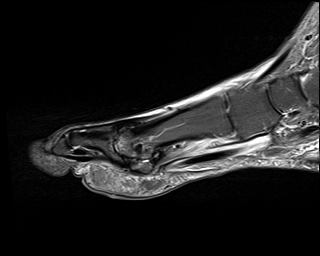
[im 31/31]
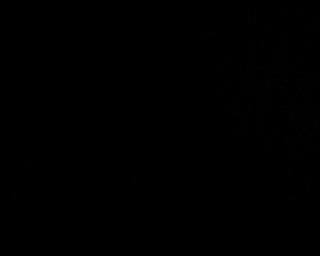

[Series 13: T1 fat-sat · coronal · non-contrast · left · 3.0mm · 0.47mm/px · 6 of 47 slices shown (1 of 3)]
[im 1/47]
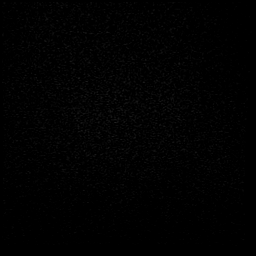
[im 10/47]
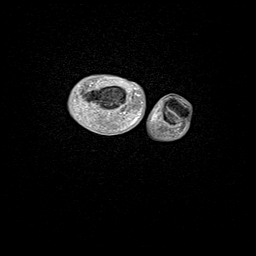
[im 19/47]
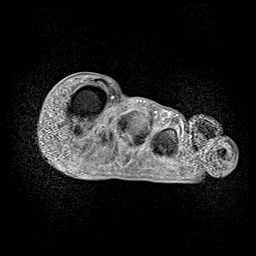
[im 28/47]
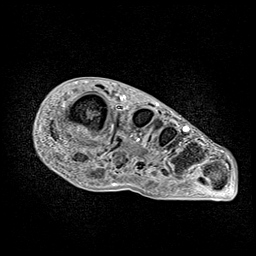
[im 37/47]
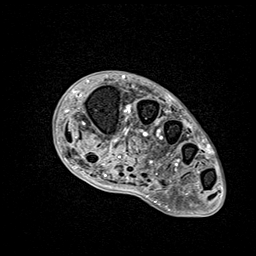
[im 47/47]
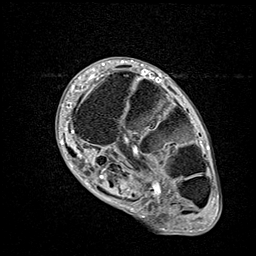

[Series 14: T1 fat-sat post-contrast · coronal · left · 3.0mm · 0.47mm/px · 6 of 47 slices shown]
[im 1/47]
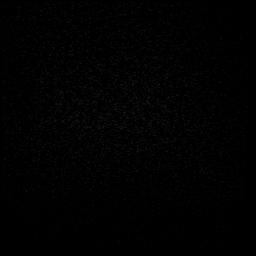
[im 10/47]
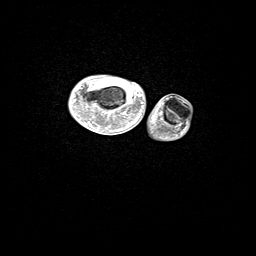
[im 19/47]
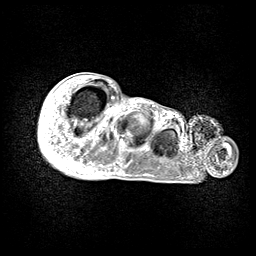
[im 28/47]
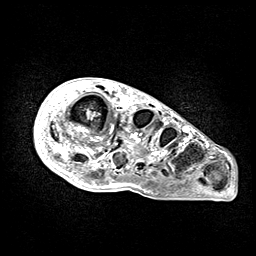
[im 37/47]
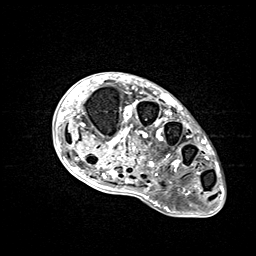
[im 47/47]
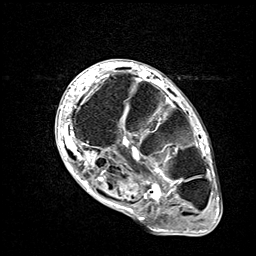

[Series 15: T1 fat-sat · axial · left · 3.0mm · 0.56mm/px · z∈[-77,-14]mm · 2 of 19 slices shown (2 of 3)]
[im 1/19]
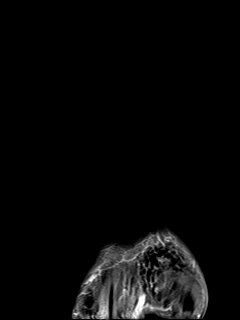
[im 19/19]
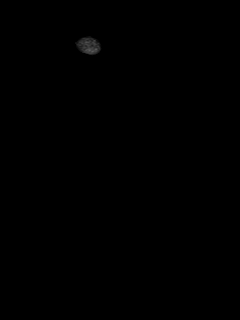

[Series 16: T1 fat-sat · sagittal · left · 3.0mm · 0.62mm/px · 4 of 30 slices shown (3 of 3)]
[im 1/30]
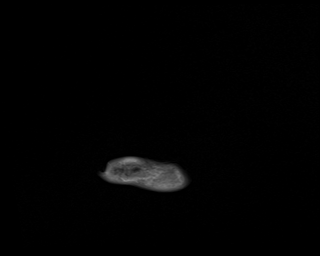
[im 10/30]
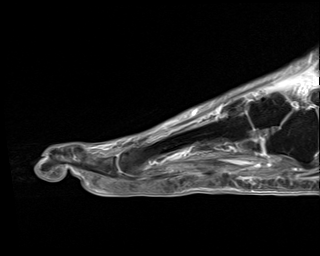
[im 20/30]
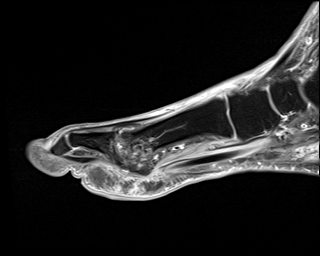
[im 30/30]
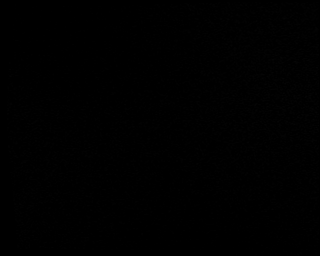

[40 of 40 positions shown; findings below may reference images not displayed]

FINDINGS: Bones/Joint/Cartilage

Prior second toe amputation with residual base of the proximal
phalanx. There is joint space narrowing of the great toe MTP joint,
with central concavity of the base of the great toe proximal
phalanx, and subchondral cystic change along the plantar aspect of
the first metatarsal head with adjacent bony edema. There is edema
and erosive/subchondral cystic change in the great toe sesamoids.
There is preserved T1 signal throughout the great toe phalanges.
There is no joint effusion.

There is no evidence of acute fracture. There is mild degenerative
changes involving the interphalangeal joints of the lesser digits.

Ligaments

The Lisfranc ligament is intact. There is no evidence of plantar
plate tear.

Muscles and Tendons

Mild muscle atrophy in the foot. There is focal bunching of a deep
flexor tendon the second toe in the forefoot compatible with prior
amputation of the second toe.

Soft tissues

Generalized soft tissue swelling of the forefoot. There is soft
tissue thickening along the plantar aspect of the prior amputation
site compatible with postsurgical change/scarring. There is no
evidence of drainable abscess. There is a soft tissue ulcer along
the plantar aspect of the medial forefoot at the level of the MTP
joint, which appears superficial.
IMPRESSION: Superficial plantar soft tissue ulcer along the medial forefoot. No
evidence of soft tissue abscess.

Findings in the great toe are most consistent with
metatarsophalangeal and metatarso-sesamoid joint arthritis with
erosive/subchondral cystic change and reactive bony edema. No
definite osteomyelitis.
